# Patient Record
Sex: Male | Born: 1960 | Race: White | Hispanic: No | Marital: Married | Smoking: Former smoker
Health system: Southern US, Community
[De-identification: ages and names within clinical notes are randomized; demographics above are authoritative.]

## PROBLEM LIST (undated history)

## (undated) DIAGNOSIS — E291 Testicular hypofunction: Secondary | ICD-10-CM

## (undated) DIAGNOSIS — E559 Vitamin D deficiency, unspecified: Secondary | ICD-10-CM

## (undated) DIAGNOSIS — M25562 Pain in left knee: Secondary | ICD-10-CM

## (undated) DIAGNOSIS — K5792 Diverticulitis of intestine, part unspecified, without perforation or abscess without bleeding: Secondary | ICD-10-CM

## (undated) DIAGNOSIS — K589 Irritable bowel syndrome without diarrhea: Secondary | ICD-10-CM

## (undated) DIAGNOSIS — E785 Hyperlipidemia, unspecified: Secondary | ICD-10-CM

## (undated) DIAGNOSIS — F419 Anxiety disorder, unspecified: Secondary | ICD-10-CM

## (undated) DIAGNOSIS — G4730 Sleep apnea, unspecified: Secondary | ICD-10-CM

## (undated) DIAGNOSIS — G8929 Other chronic pain: Secondary | ICD-10-CM

## (undated) DIAGNOSIS — T7840XA Allergy, unspecified, initial encounter: Secondary | ICD-10-CM

## (undated) DIAGNOSIS — N200 Calculus of kidney: Secondary | ICD-10-CM

## (undated) DIAGNOSIS — R0989 Other specified symptoms and signs involving the circulatory and respiratory systems: Secondary | ICD-10-CM

## (undated) DIAGNOSIS — M1990 Unspecified osteoarthritis, unspecified site: Secondary | ICD-10-CM

## (undated) DIAGNOSIS — R7303 Prediabetes: Secondary | ICD-10-CM

## (undated) HISTORY — PX: REPLACEMENT TOTAL KNEE: SUR1224

## (undated) HISTORY — DX: Other specified symptoms and signs involving the circulatory and respiratory systems: R09.89

## (undated) HISTORY — PX: MOHS SURGERY: SUR867

## (undated) HISTORY — PX: TONSILLECTOMY: SUR1361

## (undated) HISTORY — DX: Diverticulitis of intestine, part unspecified, without perforation or abscess without bleeding: K57.92

## (undated) HISTORY — DX: Allergy, unspecified, initial encounter: T78.40XA

## (undated) HISTORY — DX: Other chronic pain: G89.29

## (undated) HISTORY — PX: FOOT FUSION: SHX956

## (undated) HISTORY — PX: ELBOW ARTHROSCOPY WITH TENDON RECONSTRUCTION: SHX5616

## (undated) HISTORY — PX: FOREARM SURGERY: SHX651

## (undated) HISTORY — PX: BREAST CYST EXCISION: SHX579

## (undated) HISTORY — DX: Hyperlipidemia, unspecified: E78.5

## (undated) HISTORY — DX: Irritable bowel syndrome, unspecified: K58.9

## (undated) HISTORY — DX: Anxiety disorder, unspecified: F41.9

## (undated) HISTORY — DX: Testicular hypofunction: E29.1

## (undated) HISTORY — PX: POLYPECTOMY: SHX149

## (undated) HISTORY — DX: Vitamin D deficiency, unspecified: E55.9

## (undated) HISTORY — PX: OTHER SURGICAL HISTORY: SHX169

## (undated) HISTORY — PX: UMBILICAL HERNIA REPAIR: SHX196

## (undated) HISTORY — DX: Pain in left knee: M25.562

## (undated) HISTORY — PX: KNEE ARTHROSCOPY: SUR90

## (undated) HISTORY — PX: APPENDECTOMY: SHX54

## (undated) HISTORY — DX: Prediabetes: R73.03

## (undated) HISTORY — PX: KIDNEY STONE SURGERY: SHX686

## (undated) HISTORY — PX: COLONOSCOPY: SHX174

---

## 1997-10-01 ENCOUNTER — Emergency Department (HOSPITAL_COMMUNITY): Admission: EM | Admit: 1997-10-01 | Discharge: 1997-10-01 | Payer: Self-pay | Admitting: Emergency Medicine

## 1997-11-28 ENCOUNTER — Encounter: Payer: Self-pay | Admitting: Internal Medicine

## 1997-11-28 ENCOUNTER — Inpatient Hospital Stay (HOSPITAL_COMMUNITY): Admission: EM | Admit: 1997-11-28 | Discharge: 1997-12-02 | Payer: Self-pay | Admitting: Internal Medicine

## 2001-05-07 ENCOUNTER — Ambulatory Visit (HOSPITAL_COMMUNITY): Admission: RE | Admit: 2001-05-07 | Discharge: 2001-05-07 | Payer: Self-pay | Admitting: Surgery

## 2001-05-07 ENCOUNTER — Encounter: Payer: Self-pay | Admitting: Surgery

## 2003-09-21 ENCOUNTER — Encounter: Admission: RE | Admit: 2003-09-21 | Discharge: 2003-09-21 | Payer: Self-pay | Admitting: Surgery

## 2004-08-07 ENCOUNTER — Ambulatory Visit (HOSPITAL_COMMUNITY): Admission: RE | Admit: 2004-08-07 | Discharge: 2004-08-07 | Payer: Self-pay | Admitting: Internal Medicine

## 2006-12-22 ENCOUNTER — Ambulatory Visit (HOSPITAL_BASED_OUTPATIENT_CLINIC_OR_DEPARTMENT_OTHER): Admission: RE | Admit: 2006-12-22 | Discharge: 2006-12-22 | Payer: Self-pay | Admitting: Orthopedic Surgery

## 2007-03-22 ENCOUNTER — Ambulatory Visit: Payer: Self-pay

## 2007-04-21 ENCOUNTER — Inpatient Hospital Stay (HOSPITAL_COMMUNITY): Admission: RE | Admit: 2007-04-21 | Discharge: 2007-04-24 | Payer: Self-pay | Admitting: Orthopedic Surgery

## 2007-05-13 ENCOUNTER — Encounter: Admission: RE | Admit: 2007-05-13 | Discharge: 2007-08-02 | Payer: Self-pay | Admitting: Orthopedic Surgery

## 2007-12-17 ENCOUNTER — Ambulatory Visit (HOSPITAL_COMMUNITY): Admission: RE | Admit: 2007-12-17 | Discharge: 2007-12-17 | Payer: Self-pay | Admitting: Orthopedic Surgery

## 2008-04-19 ENCOUNTER — Inpatient Hospital Stay (HOSPITAL_COMMUNITY): Admission: RE | Admit: 2008-04-19 | Discharge: 2008-04-22 | Payer: Self-pay | Admitting: Orthopedic Surgery

## 2009-06-01 ENCOUNTER — Encounter
Admission: RE | Admit: 2009-06-01 | Discharge: 2009-08-30 | Payer: Self-pay | Admitting: Physical Medicine & Rehabilitation

## 2009-06-05 ENCOUNTER — Ambulatory Visit: Payer: Self-pay | Admitting: Physical Medicine & Rehabilitation

## 2009-06-11 ENCOUNTER — Ambulatory Visit: Payer: Self-pay | Admitting: Physical Medicine & Rehabilitation

## 2009-06-18 ENCOUNTER — Ambulatory Visit: Payer: Self-pay | Admitting: Physical Medicine & Rehabilitation

## 2009-06-26 ENCOUNTER — Ambulatory Visit: Payer: Self-pay | Admitting: Physical Medicine & Rehabilitation

## 2009-07-03 ENCOUNTER — Ambulatory Visit: Payer: Self-pay | Admitting: Physical Medicine & Rehabilitation

## 2009-08-18 ENCOUNTER — Emergency Department (HOSPITAL_COMMUNITY): Admission: EM | Admit: 2009-08-18 | Discharge: 2009-08-18 | Payer: Self-pay | Admitting: Emergency Medicine

## 2010-05-19 LAB — CBC
Hemoglobin: 13.6 g/dL (ref 13.0–17.0)
MCHC: 33.3 g/dL (ref 30.0–36.0)
MCV: 90.5 fL (ref 78.0–100.0)
Platelets: 179 10*3/uL (ref 150–400)

## 2010-05-19 LAB — POCT I-STAT, CHEM 8
BUN: 10 mg/dL (ref 6–23)
Calcium, Ion: 1.11 mmol/L — ABNORMAL LOW (ref 1.12–1.32)
Chloride: 101 mEq/L (ref 96–112)
Creatinine, Ser: 1.1 mg/dL (ref 0.4–1.5)
Glucose, Bld: 112 mg/dL — ABNORMAL HIGH (ref 70–99)
Potassium: 4.6 mEq/L (ref 3.5–5.1)
TCO2: 27 mmol/L (ref 0–100)

## 2010-05-19 LAB — DIFFERENTIAL
Basophils Relative: 0 % (ref 0–1)
Eosinophils Absolute: 0 10*3/uL (ref 0.0–0.7)
Lymphocytes Relative: 4 % — ABNORMAL LOW (ref 12–46)
Monocytes Absolute: 0.9 10*3/uL (ref 0.1–1.0)
Monocytes Relative: 6 % (ref 3–12)
Neutro Abs: 14.5 10*3/uL — ABNORMAL HIGH (ref 1.7–7.7)
Neutrophils Relative %: 90 % — ABNORMAL HIGH (ref 43–77)

## 2010-06-18 LAB — BASIC METABOLIC PANEL
CO2: 29 mEq/L (ref 19–32)
Creatinine, Ser: 0.74 mg/dL (ref 0.4–1.5)
Creatinine, Ser: 0.79 mg/dL (ref 0.4–1.5)
GFR calc non Af Amer: >60 mL/min (ref >60)
GFR calc non Af Amer: >60 mL/min (ref >60)
Glucose, Bld: 131 mg/dL — ABNORMAL HIGH (ref 70–99)
Glucose, Bld: 158 mg/dL — ABNORMAL HIGH (ref 70–99)
Potassium: 4.1 mEq/L (ref 3.5–5.1)
Sodium: 135 mEq/L (ref 135–145)

## 2010-06-18 LAB — ANAEROBIC CULTURE

## 2010-06-18 LAB — CBC
HCT: 30.6 % — ABNORMAL LOW (ref 39.0–52.0)
HCT: 31.5 % — ABNORMAL LOW (ref 39.0–52.0)
HCT: 31.9 % — ABNORMAL LOW (ref 39.0–52.0)
Hemoglobin: 10.9 g/dL — ABNORMAL LOW (ref 13.0–17.0)
Hemoglobin: 10.9 g/dL — ABNORMAL LOW (ref 13.0–17.0)
MCHC: 33.8 g/dL (ref 30.0–36.0)
MCHC: 34.3 g/dL (ref 30.0–36.0)
MCV: 94.3 fL (ref 78.0–100.0)
MCV: 92.8 fL (ref 78.0–100.0)
Platelets: 197 10*3/uL (ref 150–400)
Platelets: 139 10*3/uL — ABNORMAL LOW (ref 150–400)
Platelets: 148 10*3/uL — ABNORMAL LOW (ref 150–400)
RBC: 4.34 MIL/uL (ref 4.22–5.81)
RBC: 3.3 MIL/uL — ABNORMAL LOW (ref 4.22–5.81)
RBC: 3.43 MIL/uL — ABNORMAL LOW (ref 4.22–5.81)
RDW: 12.3 % (ref 11.5–15.5)
WBC: 7.8 10*3/uL (ref 4.0–10.5)

## 2010-06-18 LAB — URINALYSIS, ROUTINE W REFLEX MICROSCOPIC
Glucose, UA: NEGATIVE mg/dL
Hgb urine dipstick: NEGATIVE
Protein, ur: NEGATIVE mg/dL
Urobilinogen, UA: 0.2 mg/dL (ref 0.0–1.0)

## 2010-06-18 LAB — PROTIME-INR
INR: 0.9 (ref 0.00–1.49)
INR: 1.7 — ABNORMAL HIGH (ref 0.00–1.49)

## 2010-06-18 LAB — BODY FLUID CULTURE

## 2010-06-18 LAB — COMPREHENSIVE METABOLIC PANEL
ALT: 26 U/L (ref 0–53)
AST: 21 U/L (ref 0–37)
Albumin: 4.2 g/dL (ref 3.5–5.2)
CO2: 27 mEq/L (ref 19–32)
Creatinine, Ser: 0.86 mg/dL (ref 0.4–1.5)
GFR calc non Af Amer: >60 mL/min (ref >60)
Glucose, Bld: 102 mg/dL — ABNORMAL HIGH (ref 70–99)

## 2010-06-18 LAB — GRAM STAIN

## 2010-06-18 LAB — TYPE AND SCREEN: ABO/RH(D): O POS

## 2010-07-16 NOTE — Op Note (Signed)
NAMELARNIE, HEART NO.:  [id number]   MEDICAL RECORD NO.:  [id number]          PATIENT TYPE:  INP   LOCATION:  1615                         FACILITY:  [facility]   PHYSICIAN:  Ollen Gross, M.D.    DATE OF BIRTH:  26-Jul-1960   DATE OF PROCEDURE:  04/21/2007  DATE OF DISCHARGE:                               OPERATIVE REPORT   PREOPERATIVE DIAGNOSIS:  Osteoarthritis, left knee.   POSTOPERATIVE DIAGNOSIS:  Osteoarthritis, left knee.   PROCEDURE:  Left total knee arthroplasty.   SURGEON:  Ollen Gross, M.D.   ASSISTANT:  Avel Peace PA-C   ANESTHESIA:  General with postop Marcaine pain pump.   ESTIMATED BLOOD LOSS:  Minimal.   DRAINS:  None.   TOURNIQUET TIME:  42 minutes at 300 mmHg.   COMPLICATIONS:  None.   CONDITION:  Stable to recovery.   BRIEF CLINICAL NOTE:  [name] is a 50 year old male who has significant  medial compartment arthritis of the left knee with progressively  worsening pain and dysfunction.  He has had arthroscopy showing a large  chondral defect on the medial femoral condyle.  Unfortunately, his pain  continues to worsen and his function is worsening also.  He has had  multiple injections.  He had an ACL deficient knee and there is not a  candidate for unicompartmental replacement.  He presents now for total  knee arthroplasty.   PROCEDURE IN DETAIL:  After successful administration of general  anesthetic a tourniquet was placed high on the left thigh and left lower  extremity prepped and draped in usual sterile fashion.  Extremities  wrapped in Esmarch, knee flexed and tourniquet inflated to 300 mmHg.  Midline incision made with 10 blade through subcutaneous tissue to the  level of the extensor mechanism.  A fresh blade is used to make a medial  parapatellar arthrotomy.  Soft tissue over the proximal medial tibia is  subperiosteally elevated to the joint line with the knife and into the  semimembranosus bursa with a Cobb elevator.   Soft tissue laterally is  elevated attention being paid to avoiding the patellar tendon on tibial  tubercle.  Patella was subluxed laterally, knee flexed 90 degrees and  PCL removed.  ACL is ready gone.  He had a lot of scarring in the  infrapatellar area and I removed that also.  A drill was then used to  create a starting hole in the distal femur and the canal was thoroughly  irrigated.  The 5 degrees left valgus alignment guide is placed and  referencing off the posterior condyles, rotations marked and the block  pinned to remove 11 mm off the distal femur.  I took 11 because he had a  slight flexion contracture.  Distal femoral resection is made an  oscillating saw.  Sizing blocks placed, size 3 is most appropriate.  Rotations marked at the epicondylar axis.  Size 3 cutting block was  placed and the anterior-posterior chamfer cuts are made.   The tibia is then subluxed forward and the menisci are removed.  The  extramedullary tibial alignment guide is  placed referencing proximally  at the medial aspect of the tibial tubercle and distally along the  second metatarsal axis and tibial crest.  A block is pinned to remove 10  mm of the non deficient lateral side.  Tibial resection is made with an  oscillating saw.  Size 3 is most appropriate tibial component and then  the proximal tibia prepared with the modular drill and keel punch for  size 3.  Femoral preparation is completed with the intercondylar cut.   Size 3 mobile bearing tibial trial, size 3 posterior stabilized femoral  trial and  a 10-mm posterior stabilized rotating platform insert trial  are placed.  With the 10, full extension is achieved with excellent  varus, valgus, anterior and posterior balance throughout full range of  motion.  Patella was everted, thickness measured to be 22 mm.  Freehand  resection taken 12 mm, 35 template is placed, lug holes were drilled,  trial patella was placed and it tracks normally.   Osteophytes removed  from the posterior femur with the trial in place.  All trials removed  and the cut bone surfaces are prepared with pulsatile lavage.  Cement  was mixed and once ready for implantation, the size 3 mobile bearing  tibial tray, size 3 posterior stabilized femur and 35 patella are  cemented into place.  Patella was held the clamp.  Trial 10-mm inserts  placed, knee held in full extension and all extruded cement removed.  The cement fully hardened then the trials removed and the wound  copiously irrigated with saline solution.  FloSeal was injected on  posterior capsule and then the permanent 10 mm posterior stabilized  rotating platform insert is placed into the tibial tray.  FloSeal was  then placed in the medial and lateral gutters in the suprapatellar area.  Moist sponge is placed and tourniquet released for total time of 42  minutes.  The sponge is removed after two minutes and then minimal  bleeding was encountered.  The bleeding that is encountered is stopped  with electrocautery.  I then irrigated again and closed the arthrotomy  with interrupted #1 PDS.  Flexion against gravity was at 135 degrees.  Subcu closed with interrupted 2-0 Vicryl, subcuticular running 4-0  Monocryl.  The catheter for Marcaine pain pump is placed and the pump  initiated.  Incision is cleaned and dried and Steri-Strips and bulky  sterile dressing applied.  He was then placed into a knee immobilizer,  awakened and transferred to recovery in stable condition.      Ollen Gross, M.D.  Electronically Signed     FA/MEDQ  D:  04/21/2007  T:  04/22/2007  Job:  [id number]

## 2010-07-16 NOTE — H&P (Signed)
NAMEORYAN, [name] NO.:  [id number]   MEDICAL RECORD NO.:  [id number]          PATIENT TYPE:  INP   LOCATION:  NA                           FACILITY:  [facility]   PHYSICIAN:  Ollen Gross, M.D.    DATE OF BIRTH:  [birth year]   DATE OF ADMISSION:  DATE OF DISCHARGE:                              HISTORY & PHYSICAL   Date of office visit history and physical performed on April 15, 2007.  Date of admission,  today's day April 21, 2007.   CHIEF COMPLAINT:  Left knee pain.   HISTORY OF PRESENT ILLNESS:  The patient is a 50 year old male who has  been seen by Dr. Lequita Halt for ongoing knee problems.  He has had a  previous knee scope this past year with meniscal debridement and had a  very large medial femoral condyle defect which had to undergo  chondroplasty.  Over the past several months, his knee has continued to  progressively get worse with extraordinary intense pain at times.  He  has done injections which have been of no benefit.  He has moderate  narrowing, not completely bone-on-bone, but his symptoms are  progressively getting worse to the point where it is impacting his  function and mobility.  It is felt that the most predictive means of  improvement of his pain would be knee replacement.  Risks and benefits  have been discussed and he elected to proceed with surgery.  He has been  seen preoperatively by Dr. Oneta Rack and is cleared for surgery.  He has  also undergone a recent stress test on January 19 of this year which was  normal.   ALLERGIES:  AUGMENTIN CAUSES HIVES.  PENICILLIN CAUSES HIVES.  KEFLEX  CAUSES HIVES AND SWELLING.   INTOLERANCES:  Atenolol causes fatigue.  Zoloft causes diarrhea.  Effexor causes erectile dysfunction.   CURRENT MEDICATIONS:  Lipitor, Klonopin and Vicodin.   PAST MEDICAL HISTORY:  1. Hypertension.  2. Irritable bowel syndrome.  3. Dyslipidemia.  4. Renal calculi.  5. Allergic rhinitis.  6. Diverticulosis.   PAST SURGICAL HISTORY:  1. Appendectomy in 1966.  2. Excision foreign body left arm 1969 and again in 1978.  3. Biopsy left chest wall 1976.  4. Tonsillectomy 1987.  5. Colonoscopy 1997.  6. Cystoscope 1999.  7. Umbilical hernia repair March 2002.  8. Right toe fusion 2004.  9. Right elbow surgery December 2006.  10.Right foot surgery February 2007.   FAMILY HISTORY:  Father deceased age 54.  Mother living age 15, obese.  Siblings:  He has 2 brothers, ages 80 and 26.   SOCIAL HISTORY:  Married Pensions consultant, past smoker, alcohol intake on the  weekends.  Family will be assisting with care after surgery.   REVIEW OF SYSTEMS:  GENERAL:  No fevers, chills or night sweats.  NEUROLOGICAL:  No seizures, syncope, or paralysis.  RESPIRATORY:  No  shortness of breath, productive cough or hemoptysis.  CARDIOVASCULAR:  No chest pain, angina, orthopnea.  GASTROINTESTINAL:  No nausea,  vomiting, diarrhea or constipation.  GENITOURINARY:  No dysuria,  hematuria,  discharge.  MUSCULOSKELETAL:  Left knee.   PHYSICAL EXAMINATION:  VITAL SIGNS:  Pulse 72, respirations 14, blood  pressure 134/82.  GENERAL:  A 50 year old, white male, well-nourished, well-developed, no  acute distress, alert and cooperative, excellent historian.  HEENT:  Normocephalic, atraumatic.  Pupils round and reactive.  Oropharynx clear.  EOMs intact.  NECK:  Supple.  No bruits.  CHEST:  Clear anterior posterior chest walls.  No rhonchi, rales or  wheeze.  HEART:  Regular rate and rhythm.  No murmur.  S1-S2 noted.  ABDOMEN:  Soft, slightly round.  Bowel sounds present.  RECTAL/BREAST/GENITALIA:  Not done, not pertinent to present illness.  EXTREMITIES:  Left knee no effusion, tender medially.  Range of motion 1  to 125.  No instability.   IMPRESSION:  1. Osteoarthritis, left knee.  2. Hypertension.  3. Irritable bowel syndrome.  4. Dyslipidemia.  5. History of renal calculi.  6. Allergic rhinitis.  7. History of  diverticulosis.   PLAN:  The patient admitted to [facility] to undergo a left  total knee replacement arthroplasty.  Surgery will be performed by Ollen Gross.      Alexzandrew L. Perkins, P.A.C.      Ollen Gross, M.D.  Electronically Signed    ALP/MEDQ  D:  04/21/2007  T:  04/21/2007  Job:  [id number]

## 2010-07-16 NOTE — Discharge Summary (Signed)
[name], [name]                 ACCOUNT NO.:  [id number]   MEDICAL RECORD NO.:  [id number]          PATIENT TYPE:  INP   LOCATION:  1615                         FACILITY:  [facility]   PHYSICIAN:  Ollen Gross, M.D.    DATE OF BIRTH:  05/19/1960   DATE OF ADMISSION:  04/21/2007  DATE OF DISCHARGE:  04/24/2007                               DISCHARGE SUMMARY   ADMITTING DIAGNOSES:  1. Osteoarthritis, left knee.  2. Hypertension.  3. Irritable bowel syndrome.  4. Dyslipidemia.  5. History of renal calculi.  6. Allergic rhinitis.  7. History of diverticulosis.   DISCHARGE DIAGNOSES:  1. Osteoarthritis, left knee, status post left total knee replacement      arthroplasty.  2. Postoperative hyponatremia, improving.  3. Hypertension.  4. Irritable bowel syndrome.  5. Dyslipidemia.  6. History of renal calculi.  7. History of diverticulosis.   PROCEDURE:  February 18, 20009 - left total knee.  Surgeon - Dr.  Lequita Halt.  Assistant - Avel Peace PA-C.  Anesthesia general.   CONSULTS:  None.   BRIEF HISTORY:  [name] is a 50 year old male with severe medial  compartment arthritis of the left knee, progressive worsening pain and  dysfunction.  He had a knee scope with a large chondral defective on  medial femoral condyle.  Unfortunately, the pain continues to worsen and  function is worsening.  He has an ACL deficient knee.  He is not a  candidate for unicompartmental replacement.  He now presents for a total  knee.   LABORATORY DATA:  Preoperative CBC showed a hemoglobin of 13.6,  hematocrit of 38.6, white cell count of 4.8, platelets 191.  Serial CBCs  were followed.  Postoperative hemoglobin dropped down to 12.8.  Last  noted hemoglobin and hematocrit of 10.8 and 30.5.  PT/PTT preoperatively  11.9 and 32 respectively.  INR 7.9.  Serial pro times followed.  Last  noted PT/INR 24.3 and 2.1.  Chem panel on admission all within normal  limits.  Serial BMETs were followed.  Sodium did  drop down to 132, back  up to 138.  The remaining electrolytes remained within normal limits.  Preoperative UA negative.  Blood group type O+.   EKG on December 22, 2006 revealed normal sinus rhythm, normal EKG,  unconfirmed.  The patient had a stress test dated March 22, 2007; a  normal stress test nuclear study.   HOSPITAL COURSE:  The patient was admitted to [facility],  taken to the OR and underwent the above-stated procedure without  complication.  The patient total the procedure well and was later  transferred to the recovery room and the orthopedic floor.  Did have  moderate pain on the evening of surgery.  Given p.o. and IV analgesics  for pain control following the surgery.  He had a fairly rough night,  doing a little bit better on the morning of day one though.  Pressure  was a little up, felt to be due to pain component.  Hemoglobin was  stable.  Low output, so we encouraged fluids IV.  He started getting  up  out of bed on day one.  By day two, the patient was still hurting. but  pain was a little bit better.  Weaned over to p.o. medications.  Urine  output improved with the fluids.  He was diuresing his fluids well.  Sodium was down a little bit, felt to be dilutional, at 132.  Rechecked.  Day two, he was up walking about 80 feet.  Dressing was changed.  Incision looked good.  Continued to progress well, and by day three he  was tolerating his medications, walking well and was discharged home.   DISCHARGE/PLAN:  1. The patient discharged home April 24, 2007.  2. Discharge diagnoses - please see above.  3. Discharge medications - Percocet, Robaxin, Coumadin.  4. Diet - resume home diet.  5. Activity - weightbearing as tolerated left lower extremity, total      knee protocol.  Home health PT, home nursing.  Follow up in 2      weeks.   DISPOSITION:  Home.   CONDITION ON DISCHARGE:  Improving.      Alexzandrew L. Perkins, P.A.C.      Ollen Gross, M.D.  Electronically Signed    ALP/MEDQ  D:  06/07/2007  T:  06/07/2007  Job:  [id number]

## 2010-07-16 NOTE — Op Note (Signed)
[name], [name] NO.:  [id number]   MEDICAL RECORD NO.:  [id number]          PATIENT TYPE:  INP   LOCATION:                               FACILITY:  [facility]   PHYSICIAN:  Ollen Gross, M.D.    DATE OF BIRTH:  [birth year]   DATE OF PROCEDURE:  04/19/2008  DATE OF DISCHARGE:                               OPERATIVE REPORT   PREOPERATIVE DIAGNOSES:  Loose left total knee arthroplasty.   POSTOP DIAGNOSIS:  Loose left total knee arthroplasty.   PROCEDURE:  Left total knee arthroplasty revision.   SURGEON:  Ollen Gross, M.D.   ASSISTANT:  Avel Peace PA-C   ANESTHESIA:  General with postop Marcaine pain pump.   ESTIMATED BLOOD LOSS:  Minimal.   DRAIN:  Hemovac times one.   TOURNIQUET TIME:  Up 49 minutes at 300 mmHg, down 8 minutes, up  additional 20 minutes at 300 mmHg.   COMPLICATIONS:  None.   CONDITION:  Stable to recovery.   CLINICAL NOTE:  [name] is a 50 year old male who underwent a left total  knee arthroplasty last year.  He never obtained very good pain relief  and, over time, the pain has gotten progressively worse.  He has done  well with regards to his rehab but due to persistent pain, a bone scan  was obtained a few months ago and it did show increased uptake,  especially around the tibial component.  He presents now for revision  due to intractable pain.   PROCEDURE IN DETAIL:  After successful administration of general  anesthetic, a tourniquet was placed high on his left thigh and his left  lower extremity was prepped and draped in the usual sterile fashion.  Extremity was wrapped in Esmarch, knee flexed, tourniquet inflated to  300 mmHg.  Midline incision was made with a 10-blade through  subcutaneous tissue to the level of the extensor mechanism.  A fresh  blade was used to make a medial parapatellar arthrotomy.  There is  minimal fluid in the joint.  It is sent for stat Gram stain, C&S.  It  did show a few white blood cells, no  organisms.  I then thoroughly  excised the scar from under the extensor mechanism, both medially and  laterally, and freed up the medial and lateral gutters.  I was then able  to sublux the patella laterally.  I removed the tibial polyethylene from  the tibial tray.  I then began to disrupt the interface between the  femoral component and bone.  The femoral component was removed  relatively easily with minimal, if any, bone loss.  The tibia was then  subluxed forward.  I just pried underneath the interface between the  tibia and bone and easily removed the tibia after disrupting the  interface with an oscillating saw.  The tissue throughout the joint had  a very normal appearance.  We thoroughly excised the scar posteriorly.  I then removed the cement from the proximal tibia, as well as the cement  from the femur, to gain access to both the tibial and femoral  canals.  The starter reamer was passed and then the canals of both thoroughly  irrigated with saline.   I went up to 16 mm on the femoral side and 13 mm on tibial side.   The tibia is again subluxed forward and then the extramedullary tibial  cutting guide placed with the 2-degree cutting block.  The block is  pinned to remove about 1 mm or 2 mm from the tibial surface.  The  alignment is proximally at the medial aspect of the tibial tubercle and  distally along the second metatarsal axis and tibial crest.  Resection  is made with an oscillating saw to healthy-appearing bone.  A size 3 is  most appropriate tibial component.  Then we prepared the proximal tibia  with the modular drill.  I then used a 29-mm sleeve broach and got that  down to the appropriate position.  The trial size 3 __________  revision  tray with 29-sleeve and a 13 x 30-stem extension was placed with  excellent fit on the tibia.  We then removed the trial.  The femur was  then addressed.     The 60-mm reamer was placed over the intramedullary cutting guide.   The 5-degree left valgus alignment guide is placed to remove about 2 mm  from the distal femur on each side.  Resection is made with an  oscillating saw.  Size 3 is the most appropriate femoral component.  The  AP cutting block is then placed in the +2 position, effectively raising  the stem and lowering the anterior aspect of the femoral component to  the anterior cortex of the femur.  The AP cutting block is placed for  the size 3 and rotation is based using the epicondylar axis and by  placing a trial spacer with the knee flexed 90 degrees to create a  rectangular flexion gap.  The anterior and posterior cuts were made,  removing minimal bone on either cut.  The intercondylar cuts were then  made with the intercondylar block.  The size 3 posterior stabilized  femur with a 16 x 75 stem extension is then placed.  We had excellent  fit with both of the trials.  I went to a 15-mm insert at which point I  was able to obtain full extension with excellent varus-valgus and  anterior-posterior balance throughout full range of motion.   I did not remove the patellar component but cleared all the soft tissue  from around patella and removed any spurs, performing a patellaplasty.  The patella tracked normally.  I was able to get him flexed to about 135  degrees.  I then let the tourniquet down with initial tourniquet time of  49 minutes.  Moist sponge is placed.  While the tourniquet was down the  components were then assembled on the back table.  After the tourniquet  was down for 8 minutes then we irrigated and reinflated up to 300 mmHg  after wrapping it in Esmarch.  The trials were removed and I sized for  the  cement restricter, which was a size 3.  The size 3 cement  restricter was placed at the appropriate depth in the tibial canal.  The  bone surface was then thoroughly irrigated with pulsatile lavage while  the cement was mixed.  I placed 2 grams of vancomycin into the cement.  Once the  cement is ready, it is injected into the tibial canal and the  tibial component impacted.  We had  great fit with this.  Femoral  component is just cemented distally and press-fit in the canal.  Tibial  component was cemented in the canal also.  The trial 50-mm insert was  placed, knee held in full extension and all extruded cement removed.  Once the cement had fully hardened then the permanent 15-mm posterior  stabilized rotating platform insert was placed into the tibial tray.  The wound was copiously irrigated with saline solution and then FloSeal  injected in the posterior capsule, medial and lateral gutters and  suprapatellar area.  Moist sponge is placed and tourniquet released for  a second time at 20 minutes.  The sponge was held for 2 minutes and  removed.  There was minimal bleeding encountered.  That which is  encountered is stopped with electrocautery.   We then irrigated again and closed the arthrotomy over a Hemovac drain  with interrupted #1 PDS.  Flexion against gravity was 135 degrees.  Subcu was closed with interrupted 2-0 Vicryl and subcuticular running 4-  0 Monocryl.  Catheter for the Marcaine pain pump is placed and the pump  is initiated.  He is then placed into a bulky sterile dressing and knee  immobilizer, awakened and transported to recovery in stable condition.      Ollen Gross, M.D.  Electronically Signed     FA/MEDQ  D:  04/19/2008  T:  04/19/2008  Job:  [id number]

## 2010-07-16 NOTE — H&P (Signed)
[name], [name] NO.:  [id number]   MEDICAL RECORD NO.:  [id number]          PATIENT TYPE:  INP   LOCATION:                               FACILITY:  [facility]   PHYSICIAN:  Ollen Gross, M.D.    DATE OF BIRTH:  [birth year]   DATE OF ADMISSION:  04/19/2008  DATE OF DISCHARGE:                              HISTORY & PHYSICAL   DATE OF OFFICE VISIT HISTORY/PHYSICAL:  April 06, 2008.   DATE OF ADMISSION:  April 19, 2008.   CHIEF COMPLAINT:  Loose left total knee.   HISTORY OF PRESENT ILLNESS:  This is a 50 year old male well-known to  Dr. Ollen Gross.  He has previously undergone a left total knee  arthroplasty but continued to have pain that has been ongoing for quite  some time now.  He underwent a bone scan which showed possible lucency  in the tibial tray posteriorly.  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  Risks and benefits of the procedure have been  discussed with the patient.  He elects to proceed with surgery.   ALLERGIES:  PENICILLIN AND ALL PENICILLIN DERIVATIVES CAUSE HIVES.  AUGMENTIN CAUSES HIVES.  KEFLEX CAUSES HIVES AND SWELLING.   CURRENT MEDICATIONS:  Lipitor, Cipro, doxycycline, Percocet, vitamin D.   PAST MEDICAL HISTORY:  1. Hypertension.  2. Irritable bowel syndrome.  3. Dyslipidemia.  4. Renal calculi.  5. Allergic rhinitis.  6. Diverticulosis.   PAST SURGICAL HISTORY:  Appendectomy in 1966, excision foreign body left  arm 1969 and again 1978, biopsy left chest wall in 1976, tonsillectomy  1987, colonoscopy 1997, cystoscope 1999, umbilical hernia repair March  2002, right toe fusion 2004, right elbow surgery December 2006, left  total knee February 2009.   FAMILY HISTORY:  Father deceased at age 17 with COPD.  Mother living age  76.  Has two brothers.   SOCIAL HISTORY:  Married, Pensions consultant.  Past smoker, occasional intake of  alcohol on the weekends.  One child.  He does have a caregiver lined up  after surgery.   REVIEW OF SYSTEMS:  GENERAL:  No fevers, chills or night sweats.  NEURO:  No seizures, syncope or paralysis.  RESPIRATORY:  No shortness breath,  productive cough or hemoptysis.  CARDIOVASCULAR:  No chest pain,angina,  orthopnea.  GI:  No nausea, vomiting, diarrhea or constipation.  GU:  No  dysuria, hematuria or discharge.  MUSCULOSKELETAL:  Left knee.   PHYSICAL EXAM:  VITAL SIGNS:  Pulse 72, respirations 14, blood pressure  132/82.  GENERAL:  50 year old white male well-nourished, well-developed, in no  acute distress.  Alert, oriented, cooperative.  Excellent historian.  HEENT:  Normocephalic, atraumatic.  Pupils are round and reactive.  Oropharynx clear.  EOMs intact.  NECK:  Supple.  No bruits.  CHEST:  Clear anterior posterior chest walls.  No rhonchi, rales or  wheezing.  HEART:  Regular rate and rhythm.  No murmur.  S1, ST noted.  ABDOMEN:  Soft, slightly round.  Bowel sounds present, nontender.  RECTAL/BREASTS/GENITALIA:  Not done.  Not pertinent to present  illness.  EXTREMITIES:  Left knee anterior incision is well-healed.  Range of  motion 5-125.  No obvious instability.   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   PLAN:  The patient will be admitted to [facility] to undergo a  revision arthroplasty of the left total knee.  Surgery will be performed  by Dr. Ollen Gross.      Alexzandrew L. Perkins, P.A.C.      Ollen Gross, M.D.  Electronically Signed    ALP/MEDQ  D:  04/18/2008  T:  04/18/2008  Job:  [id number]   cc:   Ollen Gross, M.D.  Fax: [phone]   Jeoffrey Massed, MD  Fax: [phone]

## 2010-07-16 NOTE — Op Note (Signed)
NAMEAKIF, WELDY                 ACCOUNT NO.:  [id number]   MEDICAL RECORD NO.:  [id number]          PATIENT TYPE:  AMB   LOCATION:  [facility]                         FACILITY:  [facility]   PHYSICIAN:  Ollen Gross, M.D.    DATE OF BIRTH:  April 15, 1960   DATE OF PROCEDURE:  12/22/2006  DATE OF DISCHARGE:                               OPERATIVE REPORT   PREOPERATIVE DIAGNOSIS:  Left knee chondral defect.   POSTOPERATIVE DIAGNOSIS:  Left knee chondral defect, plus medial  meniscal tear.   PROCEDURE:  Left knee arthroscopy with meniscal debridement and  chondroplasty.   SURGEON:  Ollen Gross, M.D., no assistant.   ANESTHESIA:  General.   ESTIMATED BLOOD LOSS:  Minimal.   DRAINS:  None.   COMPLICATIONS:  None.   CONDITION:  Stable to recovery.   BRIEF CLINICAL NOTE:  [name] is a 50 year old male with a several year  history of worsening knee pain.  Previous arthroscopy elsewhere and I  have been seeing him now for about six months.  He has had extensive  nonoperative management of this knee with progressively worsening pain  and dysfunction.  The regular plain radiographs did not show a  significant arthritic change.  Given the pain and progressive  dysfunction it was decided to do a diagnostic arthroscopy with treatment  of any associated pathology.   PROCEDURE IN DETAIL:  After successful initiation of general anesthetic,  a tourniquet placed high on the left thigh and left lower extremity  prepped and draped in the usual sterile fashion.  The standard  superomedial and inferolateral incisions made with the inflow cannula  passed superomedial and camera passed inferolateral.  Arthroscopic  visualization proceeds.  Undersurface patella shows no evidence of any  chondral defect.  A lot of hypertrophic scar at the inferior pole of  patella and nothing on the articular surface.  He had a very shallow  trochlea with some grade 2 chondromalacia but no full-thickness defects.  Mediolateral gutters were visualized and there is no loose body.  Flexion and valgus force applied to the knee and the medial compartment  was entered.  He does have a small tear in posterior horn of the medial  meniscus and has about a 2 x 2 cm chondral defect of the medial most  aspect of the medial femoral condyle.  A spinal needle was used to  localize the inferomedial portal, small incision made, dilator placed  and then I probed the meniscus.  Although it looked like a small tear  visually with the probe, it is found to be a larger tear posteriorly.  I  used a basket and a 4.2 mm shaver to debride this back to a stable base  and it was found to be stable.  There was some cartilage peeling off  that defect on the medial femoral condyle.  There was a small amount of  exposed bone off also with appeared almost a layer of scarring over  that.  I debrided this back to a stable bony base and then abraded the  bone for hopeful attempt to form fibrocartilage  over the defect.  The  edges of the cartilage were normal.  The remainder of the medial  compartment looks fine.  The intercondylar notch was visualized and ACL  was absent.  The lateral compartment was entered and it looks normal.  I  again inspected the joint and there is no further tears or loose bodies.  The area of scarring in the inferior aspect of patella is visualized and  I debrided some that the scar tissue back to more healthy-appearing  tissue.  The arthroscopic equipment is then removed from the inferior  portals which were closed with interrupted 4-0 nylon.  20 mL of 4%  Marcaine with epi injected through the inflow cannula and that is  removed and that portal closed with nylon.  A bulky sterile dressing  applied and he is awakened and transferred to recovery in stable  condition.      Ollen Gross, M.D.  Electronically Signed     FA/MEDQ  D:  12/22/2006  T:  12/23/2006  Job:  [id number]

## 2010-07-19 NOTE — Discharge Summary (Signed)
[name], [name]                 ACCOUNT NO.:  [id number]   MEDICAL RECORD NO.:  [id number]          PATIENT TYPE:  INP   LOCATION:  1601                         FACILITY:  [facility]   PHYSICIAN:  Ollen Gross, M.D.    DATE OF BIRTH:  05-Sep-1960   DATE OF ADMISSION:  04/19/2008  DATE OF DISCHARGE:  04/22/2008                               DISCHARGE SUMMARY   DISCHARGE DIAGNOSIS:  Loose left total knee arthroplasty.   OTHER DIAGNOSES:  1. Hypertension.  2. Irritable bowel syndrome.  3. Dyslipidemia.  4. Renal calculi.  5. Allergic rhinitis.  6. Diverticulosis.   PROCEDURES:  Revision left total knee arthroplasty performed April 19, 2008.   HOSPITAL COURSE:  [name] is a 50 year old male who underwent a left total  knee arthroplasty approximately a year ago.  He had persistent pain in  the knee throughout his postoperative course.  This got progressively  worse over time.  A bone scan was performed in the fall and noted that  there was increased uptake around the components consistent with  possible loosening.  He did not have any signs of infection at all.  Due  to his persistent worsening pain, it was decided to pursue revision  total knee arthroplasty.  He presented for that procedure and on  April 19, 2008 was taken to the operating room at which time he  underwent a left total knee arthroplasty revision performed by Dr.  Lequita Halt and assisted by Avel Peace, PA-C.  He tolerated the procedure  well without complications.  He went to the recovery room postop and  then to the orthopedic floor.  He had stable vital signs throughout the  first postoperative night with intact neurovascular function in the left  lower extremity.  On postoperative day #1, his pain was very well-  controlled with the oral analgesics and the PCA pump.  Again, he had  intact neurovascular function.   He had a hemoglobin of 10.9 with normal electrolytes with a sodium of  135, potassium 4.1,  chloride 102, CO2 of 29, BUN 6, creatinine 0.79 and  glucose 158.  All of the intraoperative cultures were negative.  He got  up with therapy that day and ambulated.  On postoperative day #2, the  Foley catheter PCA pump and IVs were discontinued.  He still had good  pain control.  Hemoglobin was 10.4 with normal electrolytes.  He did  very well with his physical therapy and on postoperative day #3  ambulated 135 degrees.  He was able to ambulate without his knee  immobilizer and do straight leg raising.  Hemoglobin was 10.9 as of  postoperative day #3 with INR of 2.2.  He had met his physical therapy  goals and it was felt that he was ready for discharge on postoperative  day #3.   DISPOSITION:  He is discharged home with home health physical therapy  and home health nursing for monitoring of his Coumadin and INR levels.  He will be on Coumadin for total of 3 weeks.  He is tolerating a regular  diet and is  cleared to shower at the time of discharge.   DISCHARGE MEDICATIONS:  1. Oxycodone IR 10 mg 1-2 tablets every 4-6 hours as needed for pain.  2. Robaxin 500 mg 1 tablet every 6-8 hours as needed for muscle spasm.  3. Coumadin as per pharmacy protocol, a total of 3 weeks postop.   HOME MEDICATIONS:  1. Lipitor 40 mg p.o. q. a.m.  2. Clonazepam 1 mg q.h.s.  3. Ciprofloxacin 500 mm b.i.d. as needed for diverticulitis.      Ollen Gross, M.D.  Electronically Signed     FA/MEDQ  D:  05/27/2008  T:  05/27/2008  Job:  [id number]

## 2010-11-22 LAB — TYPE AND SCREEN
ABO/RH(D): O POS
Antibody Screen: NEGATIVE

## 2010-11-22 LAB — BASIC METABOLIC PANEL
BUN: 11
BUN: 5 — ABNORMAL LOW
CO2: 25
CO2: 28
Calcium: 8.2 — ABNORMAL LOW
Calcium: 8.3 — ABNORMAL LOW
Chloride: 103
Creatinine, Ser: 0.7
Creatinine, Ser: 0.75
Creatinine, Ser: 1.12
GFR calc Af Amer: >60
GFR calc Af Amer: >60
GFR calc non Af Amer: >60
GFR calc non Af Amer: >60
Glucose, Bld: 112 — ABNORMAL HIGH
Glucose, Bld: 139 — ABNORMAL HIGH
Glucose, Bld: 142 — ABNORMAL HIGH
Sodium: 138

## 2010-11-22 LAB — PROTIME-INR
INR: 1
INR: 2.1 — ABNORMAL HIGH
Prothrombin Time: 13.6
Prothrombin Time: 17.2 — ABNORMAL HIGH
Prothrombin Time: 24.3 — ABNORMAL HIGH

## 2010-11-22 LAB — COMPREHENSIVE METABOLIC PANEL
ALT: 41
Albumin: 3.8
Alkaline Phosphatase: 48
BUN: 11
Calcium: 9.1
Chloride: 103
Creatinine, Ser: 0.84
GFR calc Af Amer: >60
GFR calc non Af Amer: >60
Sodium: 138
Total Bilirubin: 0.7

## 2010-11-22 LAB — URINALYSIS, ROUTINE W REFLEX MICROSCOPIC
Hgb urine dipstick: NEGATIVE
Specific Gravity, Urine: 1.017
Urobilinogen, UA: 0.2

## 2010-11-22 LAB — CBC
HCT: 31.8 — ABNORMAL LOW
Hemoglobin: 10.8 — ABNORMAL LOW
MCHC: 34.7
MCHC: 35.3
MCV: 90.4
Platelets: 169
Platelets: 216
RBC: 4.34
RBC: 3.37 — ABNORMAL LOW
RDW: 12.5
RDW: 12.7
RDW: 12.8
WBC: 4.8
WBC: 10.5

## 2010-12-11 LAB — POCT HEMOGLOBIN-HEMACUE: Operator id: 268271

## 2011-12-30 ENCOUNTER — Encounter: Payer: Self-pay | Admitting: Gastroenterology

## 2012-01-07 ENCOUNTER — Ambulatory Visit (AMBULATORY_SURGERY_CENTER): Payer: BC Managed Care – PPO | Admitting: *Deleted

## 2012-01-07 ENCOUNTER — Encounter: Payer: Self-pay | Admitting: Gastroenterology

## 2012-01-07 VITALS — Ht 65.5 in | Wt 206.0 lb

## 2012-01-07 DIAGNOSIS — Z1211 Encounter for screening for malignant neoplasm of colon: Secondary | ICD-10-CM

## 2012-01-07 MED ORDER — MOVIPREP 100 G PO SOLR: ORAL | Status: DC

## 2012-01-20 ENCOUNTER — Encounter: Payer: Self-pay | Admitting: Gastroenterology

## 2012-01-22 ENCOUNTER — Encounter: Payer: Self-pay | Admitting: Gastroenterology

## 2012-01-22 ENCOUNTER — Ambulatory Visit (AMBULATORY_SURGERY_CENTER): Payer: BC Managed Care – PPO | Admitting: Gastroenterology

## 2012-01-22 VITALS — BP 142/88 | HR 64 | Temp 98.1°F | Resp 18 | Ht 65.0 in | Wt 206.0 lb

## 2012-01-22 DIAGNOSIS — D126 Benign neoplasm of colon, unspecified: Secondary | ICD-10-CM

## 2012-01-22 DIAGNOSIS — K5730 Diverticulosis of large intestine without perforation or abscess without bleeding: Secondary | ICD-10-CM

## 2012-01-22 DIAGNOSIS — Z1211 Encounter for screening for malignant neoplasm of colon: Secondary | ICD-10-CM

## 2012-01-22 LAB — HM COLONOSCOPY

## 2012-01-22 MED ORDER — SODIUM CHLORIDE 0.9 % IV SOLN: 500.0000 mL | INTRAVENOUS | Status: DC

## 2012-01-22 NOTE — Progress Notes (Signed)
Patient did not experience any of the following events: a burn prior to discharge; a fall within the facility; wrong site/side/patient/procedure/implant event; or a hospital transfer or hospital admission upon discharge from the facility. (G8907) Patient did not have preoperative order for IV antibiotic SSI prophylaxis. (G8918)  

## 2012-01-22 NOTE — Patient Instructions (Addendum)

## 2012-01-22 NOTE — Op Note (Signed)
[facility] [address] [place], [address]   COLONOSCOPY PROCEDURE REPORT  PATIENT: [name], [name]  MR#: [id number] BIRTHDATE: 07-Oct-1960 , 51  yrs. old GENDER: Male ENDOSCOPIST: Louis Meckel, MD REFERRED AO:[id number] Oneta Rack, M.D. PROCEDURE DATE:  01/22/2012 PROCEDURE:   Colonoscopy with cold biopsy polypectomy ASA CLASS:   Class II INDICATIONS:average risk screening. MEDICATIONS: MAC sedation, administered by CRNA and propofol (Diprivan) 300mg  IV  DESCRIPTION OF PROCEDURE:   After the risks benefits and alternatives of the procedure were thoroughly explained, informed consent was obtained.  A digital rectal exam revealed no abnormalities of the rectum.   The LB CF-[id number]  endoscope was introduced through the anus and advanced to the cecum, which was identified by both the appendix and ileocecal valve. No adverse events experienced.   The quality of the prep was Suprep good  The instrument was then slowly withdrawn as the colon was fully examined.      COLON FINDINGS: There were 2 sessile one and 2 mm polyps in the cecum.  Each was removed with cold biopsy forceps.  There was a single 2 mm polyp in the ascending colon removed with cold biopsy forceps.   Moderate diverticulosis was noted in the sigmoid colon. Mild diverticulosis was noted in the descending colon.   The colon mucosa was otherwise normal.  Retroflexed views revealed no abnormalities. The time to cecum=minutes 0 seconds.  Withdrawal time=10 minutes 20 seconds.  The scope was withdrawn and the procedure completed. COMPLICATIONS: There were no complications.  ENDOSCOPIC IMPRESSION: 1.   There were 2 sessile one and 2 mm polyps in the cecum.  Each was removed with cold biopsy forceps.  There was a single 2 mm polyp in the ascending colon removed with cold biopsy forceps. 2.   Moderate diverticulosis was noted in the sigmoid colon 3.   Mild diverticulosis was noted in the  descending colon 4.   The colon mucosa was otherwise normal  RECOMMENDATIONS: If the polyp(s) removed today are proven to be adenomatous (pre-cancerous) polyps, you will need a repeat colonoscopy in 5 years.  Otherwise you should continue to follow colorectal cancer screening guidelines for "routine risk" patients with colonoscopy in 10 years.  You will receive a letter within 1-2 weeks with the results of your biopsy as well as final recommendations.  Please call my office if you have not received a letter after 3 weeks.   eSigned:  Louis Meckel, MD 01/22/2012 3:26 PM   cc:   PATIENT NAME:  [name], [name] MR#: [id number]

## 2012-01-23 ENCOUNTER — Telehealth: Payer: Self-pay | Admitting: *Deleted

## 2012-01-23 NOTE — Telephone Encounter (Signed)
Left message on number given in [email or web address] 

## 2012-02-02 ENCOUNTER — Encounter: Payer: Self-pay | Admitting: Gastroenterology

## 2012-10-27 ENCOUNTER — Encounter: Payer: Self-pay | Admitting: Pulmonary Disease

## 2012-10-27 ENCOUNTER — Ambulatory Visit (INDEPENDENT_AMBULATORY_CARE_PROVIDER_SITE_OTHER): Payer: BC Managed Care – PPO | Admitting: Pulmonary Disease

## 2012-10-27 VITALS — BP 132/80 | HR 100 | Temp 97.6°F | Ht 66.0 in | Wt 215.0 lb

## 2012-10-27 DIAGNOSIS — G4733 Obstructive sleep apnea (adult) (pediatric): Secondary | ICD-10-CM

## 2012-10-27 DIAGNOSIS — Z9989 Dependence on other enabling machines and devices: Secondary | ICD-10-CM | POA: Insufficient documentation

## 2012-10-27 NOTE — Assessment & Plan Note (Signed)
The patient's history is very suggestive of clinically significant sleep apnea.  I had a long discussion with him about the pathophysiology of sleep apnea, including its impact to his cardiovascular health and quality of life.  I think he needs to have a sleep study done, and the patient is agreeable.  Given his history and overall good health, I think he is a great candidate for sleep testing.  We'll arrange followup when the results are available.

## 2012-10-27 NOTE — Patient Instructions (Addendum)
Will schedule for home sleep testing, and will arrange for followup once the results are available. Work on weight loss. 

## 2012-10-27 NOTE — Progress Notes (Signed)
Subjective:    Patient ID: [name], male    DOB: 1960/12/11, 52 y.o.   MRN: [id number]  HPI The patient is a 52 year old male who been asked to see for possible obstructive sleep apnea.  He has been noted to have loud snoring, as well as an abnormal breathing pattern during sleep by his wife.  He is not rested in the mornings upon arising, and does note daytime sleepiness with inactivity.  However, he stays very busy with his job during the day.  He will follow sleep easily watching television or movies in the evening, but denies any sleepiness while he is actually driving.  He will fall asleep in the car quickly if someone else is driving.  The patient states that his weight is neutral in the last 2 years, and his Epworth score today is abnormal at 18-19.   Sleep Questionnaire What time do you typically go to bed?( Between what hours) 11p-12a 11p-12a at 1533 on 10/27/12 by Maisie Fus, CMA How long does it take you to fall asleep? less than 15 mins most nights--3-4hrs at times less than 15 mins most nights--3-4hrs at times at 1533 on 10/27/12 by Maisie Fus, CMA How many times during the night do you wake up? 22 wife states that he awakens more frequently than 2x at 1533 on 10/27/12 by Maisie Fus, CMA What time do you get out of bed to start your day? No Value 4-5am at 1533 on 10/27/12 by Maisie Fus, CMA Do you drive or operate heavy machinery in your occupation? No No at 1533 on 10/27/12 by Maisie Fus, CMA How much has your weight changed (up or down) over the past two years? (In pounds) 5 lb (2.268 kg)5 lb (2.268 kg) increase/decrease at 1533 on 10/27/12 by Maisie Fus, CMA Have you ever had a sleep study before? No No at 1533 on 10/27/12 by Maisie Fus, CMA Do you currently use CPAP? No No at 1533 on 10/27/12 by Maisie Fus, CMA Do you wear oxygen at any time? No No at 1533 on 10/27/12 by Maisie Fus, CMA   Review of Systems   Constitutional: Negative for fever and unexpected weight change.  HENT: Negative for ear pain, nosebleeds, congestion, sore throat, rhinorrhea, sneezing, trouble swallowing, dental problem, postnasal drip and sinus pressure.        Allergies/sinus  Eyes: Negative for redness and itching.  Respiratory: Negative for cough, chest tightness, shortness of breath and wheezing.   Cardiovascular: Positive for leg swelling ( left knee replaced x 2). Negative for palpitations.  Gastrointestinal: Negative for nausea and vomiting.  Genitourinary: Negative for dysuria.  Musculoskeletal: Negative for joint swelling.  Skin: Negative for rash.  Neurological: Negative for headaches.  Hematological: Does not bruise/bleed easily.  Psychiatric/Behavioral: Negative for dysphoric mood. The patient is not nervous/anxious.        Objective:   Physical Exam Constitutional: overweight male, no acute distress  HENT:  Nares patent without discharge, septal deviation to left with near obstruction  Oropharynx without exudate, palate and uvula are moderately elongated.   Eyes:  Perrla, eomi, no scleral icterus  Neck:  No JVD, no TMG  Cardiovascular:  Normal rate, regular rhythm, no rubs or gallops.  No murmurs        Intact distal pulses  Pulmonary :  Normal breath sounds, no stridor or respiratory distress   No rales, rhonchi, or wheezing  Abdominal:  Soft, nondistended, bowel sounds  present.  No tenderness noted.   Musculoskeletal:  trace lower extremity edema noted.  Lymph Nodes:  No cervical lymphadenopathy noted  Skin:  No cyanosis noted  Neurologic:  Appears sleepy but appropriate, moves all 4 extremities without obvious deficit.         Assessment & Plan:

## 2012-11-12 ENCOUNTER — Telehealth: Payer: Self-pay | Admitting: Pulmonary Disease

## 2012-11-12 NOTE — Telephone Encounter (Signed)
Pt needs ov to review his recent sleep study 

## 2012-11-15 ENCOUNTER — Encounter: Payer: Self-pay | Admitting: Pulmonary Disease

## 2012-11-18 NOTE — Telephone Encounter (Signed)
LMOM x 1 

## 2012-11-19 ENCOUNTER — Telehealth: Payer: Self-pay | Admitting: Pulmonary Disease

## 2012-11-19 NOTE — Telephone Encounter (Signed)
Called and spoke with pt and he is aware of appt with [facility] on Tuesday 9-23 at 9am.  Nothing further is needed.

## 2012-11-19 NOTE — Telephone Encounter (Signed)
Pt is aware of appt with [facility] on 9-23 at 9.

## 2012-11-23 ENCOUNTER — Ambulatory Visit (INDEPENDENT_AMBULATORY_CARE_PROVIDER_SITE_OTHER): Payer: BC Managed Care – PPO | Admitting: Pulmonary Disease

## 2012-11-23 ENCOUNTER — Encounter: Payer: Self-pay | Admitting: Pulmonary Disease

## 2012-11-23 VITALS — BP 142/78 | HR 53 | Temp 97.4°F | Ht 66.0 in | Wt 216.0 lb

## 2012-11-23 DIAGNOSIS — G4733 Obstructive sleep apnea (adult) (pediatric): Secondary | ICD-10-CM

## 2012-11-23 NOTE — Patient Instructions (Addendum)
Will start on cpap at a moderate pressure.  Please call if having tolerance issues. Work on weight reduction. followup with me in 6 weeks.

## 2012-11-23 NOTE — Progress Notes (Signed)
  Subjective:    Patient ID: [name], male    DOB: 23-Jan-1961, 52 y.o.   MRN: [id number]  HPI Patient comes in today for followup of his recent sleep study.  He was found to have moderate to severe OSA, with an AHI 37 events per hour and oxygen desaturation as low as 72%.  I reviewed the study with him in detail, and answered all of his questions.   Review of Systems  Constitutional: Negative for fever and unexpected weight change.  HENT: Negative for ear pain, nosebleeds, congestion, sore throat, rhinorrhea, sneezing, trouble swallowing, dental problem, postnasal drip and sinus pressure.        Allergies  Eyes: Negative for redness and itching.  Respiratory: Negative for cough, chest tightness, shortness of breath and wheezing.   Cardiovascular: Negative for palpitations and leg swelling.  Gastrointestinal: Negative for nausea and vomiting.  Genitourinary: Negative for dysuria.  Musculoskeletal: Negative for joint swelling.  Skin: Negative for rash.  Neurological: Negative for headaches.  Hematological: Does not bruise/bleed easily.  Psychiatric/Behavioral: Negative for dysphoric mood. The patient is not nervous/anxious.        Objective:   Physical Exam Overweight male in no acute distress Nose without purulent or discharge noted Neck without lymphadenopathy or thyromegaly Lower extremities without edema, no cyanosis Alert and oriented, moves all 4 extremities.       Assessment & Plan:

## 2012-11-23 NOTE — Assessment & Plan Note (Signed)
The patient has moderate to severe OSA by his recent sleep study, and I have recommended a trial of CPAP while working on weight loss.  The patient is agreeable to trying this. I will set the patient up on cpap at a moderate pressure level to allow for desensitization, and will troubleshoot the device over the next 4-6weeks if needed.  The pt is to call me if having issues with tolerance.  Will then optimize the pressure once patient is able to wear cpap on a consistent basis.

## 2013-01-04 ENCOUNTER — Ambulatory Visit (INDEPENDENT_AMBULATORY_CARE_PROVIDER_SITE_OTHER): Payer: BC Managed Care – PPO | Admitting: Pulmonary Disease

## 2013-01-04 ENCOUNTER — Encounter: Payer: Self-pay | Admitting: Pulmonary Disease

## 2013-01-04 VITALS — BP 140/78 | HR 44 | Temp 98.1°F | Ht 66.0 in | Wt 217.2 lb

## 2013-01-04 DIAGNOSIS — G4733 Obstructive sleep apnea (adult) (pediatric): Secondary | ICD-10-CM

## 2013-01-04 NOTE — Patient Instructions (Signed)
Will optimize pressure on the auto setting.  Will call once I receive the dowload. Work on weight loss followup with me in 6mos, but call if having tolerance issues.

## 2013-01-04 NOTE — Progress Notes (Signed)
  Subjective:    Patient ID: [name], male    DOB: June 17, 1960, 52 y.o.   MRN: [id number]  HPI The patient comes in today for followup of his obstructive sleep apnea.  He was started on CPAP at the last visit, he has been wearing the device compliantly.  His snoring has resolved according to his wife, and he has seen some improvement in his sleep and daytime alertness.  He initially had mask issues, but now it is fitting fine.   Review of Systems  Constitutional: Negative for fever and unexpected weight change.  HENT: Negative for congestion, dental problem, ear pain, nosebleeds, postnasal drip, rhinorrhea, sinus pressure, sneezing, sore throat and trouble swallowing.   Eyes: Negative for redness and itching.  Respiratory: Negative for cough, chest tightness, shortness of breath and wheezing.   Cardiovascular: Negative for palpitations and leg swelling.  Gastrointestinal: Negative for nausea and vomiting.  Genitourinary: Negative for dysuria.  Musculoskeletal: Negative for joint swelling.  Skin: Negative for rash.  Neurological: Negative for headaches.  Hematological: Does not bruise/bleed easily.  Psychiatric/Behavioral: Negative for dysphoric mood. The patient is not nervous/anxious.        Objective:   Physical Exam Overweight male in nad Nose without purulence or d/c noted. No skin breakdown or pressure necrosis from cpap mask.  Neck without LN or TMG LE without edema, no cyanosis Alert and oriented, moves all 4.        Assessment & Plan:

## 2013-01-04 NOTE — Assessment & Plan Note (Signed)
Patient been wearing CPAP compliantly, and has had a positive response.  I reminded him we need to optimize his pressure, and we'll do this on the automatic setting.  Finally, I encouraged him to work aggressively on weight loss.

## 2013-03-08 ENCOUNTER — Ambulatory Visit: Payer: Self-pay | Admitting: Internal Medicine

## 2013-03-16 DIAGNOSIS — R0989 Other specified symptoms and signs involving the circulatory and respiratory systems: Secondary | ICD-10-CM | POA: Insufficient documentation

## 2013-03-16 DIAGNOSIS — K5792 Diverticulitis of intestine, part unspecified, without perforation or abscess without bleeding: Secondary | ICD-10-CM | POA: Insufficient documentation

## 2013-03-16 DIAGNOSIS — E559 Vitamin D deficiency, unspecified: Secondary | ICD-10-CM | POA: Insufficient documentation

## 2013-03-16 DIAGNOSIS — E782 Mixed hyperlipidemia: Secondary | ICD-10-CM | POA: Insufficient documentation

## 2013-03-16 DIAGNOSIS — K589 Irritable bowel syndrome without diarrhea: Secondary | ICD-10-CM | POA: Insufficient documentation

## 2013-03-16 DIAGNOSIS — E349 Endocrine disorder, unspecified: Secondary | ICD-10-CM | POA: Insufficient documentation

## 2013-03-16 DIAGNOSIS — R7309 Other abnormal glucose: Secondary | ICD-10-CM | POA: Insufficient documentation

## 2013-03-17 ENCOUNTER — Encounter: Payer: Self-pay | Admitting: Internal Medicine

## 2013-03-17 ENCOUNTER — Ambulatory Visit (INDEPENDENT_AMBULATORY_CARE_PROVIDER_SITE_OTHER): Payer: BC Managed Care – PPO | Admitting: Internal Medicine

## 2013-03-17 VITALS — BP 128/76 | HR 56 | Temp 98.1°F | Resp 16 | Wt 212.8 lb

## 2013-03-17 DIAGNOSIS — Z79899 Other long term (current) drug therapy: Secondary | ICD-10-CM

## 2013-03-17 DIAGNOSIS — I10 Essential (primary) hypertension: Secondary | ICD-10-CM

## 2013-03-17 DIAGNOSIS — R7309 Other abnormal glucose: Secondary | ICD-10-CM

## 2013-03-17 DIAGNOSIS — E782 Mixed hyperlipidemia: Secondary | ICD-10-CM

## 2013-03-17 DIAGNOSIS — E291 Testicular hypofunction: Secondary | ICD-10-CM

## 2013-03-17 DIAGNOSIS — E559 Vitamin D deficiency, unspecified: Secondary | ICD-10-CM

## 2013-03-17 LAB — BASIC METABOLIC PANEL WITH GFR
BUN: 13 mg/dL (ref 6–23)
CALCIUM: 9.8 mg/dL (ref 8.4–10.5)
CO2: 30 mEq/L (ref 19–32)
Chloride: 100 mEq/L (ref 96–112)
Creat: 0.9 mg/dL (ref 0.50–1.35)
GFR, Est African American: >89 mL/min
GFR, Est Non African American: >89 mL/min
GLUCOSE: 98 mg/dL (ref 70–99)
Potassium: 4.8 mEq/L (ref 3.5–5.3)
Sodium: 141 mEq/L (ref 135–145)

## 2013-03-17 LAB — CBC WITH DIFFERENTIAL/PLATELET
BASOS PCT: 1 % (ref 0–1)
Basophils Absolute: 0.1 10*3/uL (ref 0.0–0.1)
EOS ABS: 0.2 10*3/uL (ref 0.0–0.7)
Eosinophils Relative: 2 % (ref 0–5)
HEMATOCRIT: 46.9 % (ref 39.0–52.0)
HEMOGLOBIN: 16.1 g/dL (ref 13.0–17.0)
LYMPHS ABS: 1.5 10*3/uL (ref 0.7–4.0)
Lymphocytes Relative: 22 % (ref 12–46)
MCH: 31.2 pg (ref 26.0–34.0)
MCHC: 34.3 g/dL (ref 30.0–36.0)
MCV: 90.9 fL (ref 78.0–100.0)
MONO ABS: 1 10*3/uL (ref 0.1–1.0)
Monocytes Relative: 15 % — ABNORMAL HIGH (ref 3–12)
Neutro Abs: 4.1 10*3/uL (ref 1.7–7.7)
Neutrophils Relative %: 60 % (ref 43–77)
Platelets: 211 10*3/uL (ref 150–400)
RBC: 5.16 MIL/uL (ref 4.22–5.81)
RDW: 13.3 % (ref 11.5–15.5)
WBC: 6.7 10*3/uL (ref 4.0–10.5)

## 2013-03-17 LAB — TSH: TSH: 1.032 u[IU]/mL (ref 0.350–4.500)

## 2013-03-17 LAB — LIPID PANEL
CHOL/HDL RATIO: 2.8 ratio
Cholesterol: 156 mg/dL (ref 0–200)
HDL: 55 mg/dL (ref >39)
LDL Cholesterol: 89 mg/dL (ref 0–99)
Triglycerides: 61 mg/dL (ref <150)
VLDL: 12 mg/dL (ref 0–40)

## 2013-03-17 LAB — HEPATIC FUNCTION PANEL
ALT: 28 U/L (ref 0–53)
AST: 36 U/L (ref 0–37)
Albumin: 4.5 g/dL (ref 3.5–5.2)
Alkaline Phosphatase: 46 U/L (ref 39–117)
BILIRUBIN INDIRECT: 0.4 mg/dL (ref 0.0–0.9)
Bilirubin, Direct: 0.1 mg/dL (ref 0.0–0.3)
TOTAL PROTEIN: 6.9 g/dL (ref 6.0–8.3)
Total Bilirubin: 0.5 mg/dL (ref 0.3–1.2)

## 2013-03-17 LAB — TESTOSTERONE: Testosterone: 972 ng/dL — ABNORMAL HIGH (ref 300–890)

## 2013-03-17 LAB — HEMOGLOBIN A1C
HEMOGLOBIN A1C: 5.8 % — AB (ref <5.7)
MEAN PLASMA GLUCOSE: 120 mg/dL — AB (ref <117)

## 2013-03-17 LAB — MAGNESIUM: Magnesium: 1.8 mg/dL (ref 1.5–2.5)

## 2013-03-17 NOTE — Patient Instructions (Signed)
 Testosterone injection What is this medicine? TESTOSTERONE (tes TOS ter one) is the main male hormone. It supports normal male development such as muscle growth, facial hair, and deep voice. It is used in males to treat low testosterone levels. This medicine may be used for other purposes; ask your health care provider or pharmacist if you have questions. COMMON BRAND NAME(S): Andro-L.A., Aveed, Delatestryl, Depo-Testosterone, Virilon What should I tell my health care provider before I take this medicine? They need to know if you have any of these conditions: -breast cancer -diabetes -heart disease -kidney disease -liver disease -lung disease -prostate cancer, enlargement -an unusual or allergic reaction to testosterone, other medicines, foods, dyes, or preservatives -pregnant or trying to get pregnant -breast-feeding How should I use this medicine? This medicine is for injection into a muscle. It is usually given by a health care professional in a hospital or clinic setting. Contact your pediatrician regarding the use of this medicine in children. While this medicine may be prescribed for children as young as 12 years of age for selected conditions, precautions do apply. Overdosage: If you think you have taken too much of this medicine contact a poison control center or emergency room at once. NOTE: This medicine is only for you. Do not share this medicine with others. What if I miss a dose? Try not to miss a dose. Your doctor or health care professional will tell you when your next injection is due. Notify the office if you are unable to keep an appointment. What may interact with this medicine? -medicines for diabetes -medicines that treat or prevent blood clots like warfarin -oxyphenbutazone -propranolol -steroid medicines like prednisone or cortisone This list may not describe all possible interactions. Give your health care provider a list of all the medicines, herbs,  non-prescription drugs, or dietary supplements you use. Also tell them if you smoke, drink alcohol, or use illegal drugs. Some items may interact with your medicine. What should I watch for while using this medicine? Visit your doctor or health care professional for regular checks on your progress. They will need to check the level of testosterone in your blood. This medicine may affect blood sugar levels. If you have diabetes, check with your doctor or health care professional before you change your diet or the dose of your diabetic medicine. This drug is banned from use in athletes by most athletic organizations. What side effects may I notice from receiving this medicine? Side effects that you should report to your doctor or health care professional as soon as possible: -allergic reactions like skin rash, itching or hives, swelling of the face, lips, or tongue -breast enlargement -breathing problems -changes in mood, especially anger, depression, or rage -dark urine -general ill feeling or flu-like symptoms -light-colored stools -loss of appetite, nausea -nausea, vomiting -right upper belly pain -stomach pain -swelling of ankles -too frequent or persistent erections -trouble passing urine or change in the amount of urine -unusually weak or tired -yellowing of the eyes or skin Additional side effects that can occur in women include: -deep or hoarse voice -facial hair growth -irregular menstrual periods Side effects that usually do not require medical attention (report to your doctor or health care professional if they continue or are bothersome): -acne -change in sex drive or performance -hair loss -headache This list may not describe all possible side effects. Call your doctor for medical advice about side effects. You may report side effects to FDA at 1-800-FDA-1088. Where should I keep my medicine?   Keep out of the reach of children. This medicine can be abused. Keep your  medicine in a safe place to protect it from theft. Do not share this medicine with anyone. Selling or giving away this medicine is dangerous and against the law. Store at room temperature between 20 and 25 degrees C (68 and 77 degrees F). Do not freeze. Protect from light. Follow the directions for the product you are prescribed. Throw away any unused medicine after the expiration date. NOTE: This sheet is a summary. It may not cover all possible information. If you have questions about this medicine, talk to your doctor, pharmacist, or health care provider.  2014, Elsevier/Gold Standard. (2007-04-30 16:13:46)  Hypertension As your heart beats, it forces blood through your arteries. This force is your blood pressure. If the pressure is too high, it is called hypertension (HTN) or high blood pressure. HTN is dangerous because you may have it and not know it. High blood pressure may mean that your heart has to work harder to pump blood. Your arteries may be narrow or stiff. The extra work puts you at risk for heart disease, stroke, and other problems.  Blood pressure consists of two numbers, a higher number over a lower, 110/72, for example. It is stated as "110 over 72." The ideal is below 120 for the top number (systolic) and under 80 for the bottom (diastolic). Write down your blood pressure today. You should pay close attention to your blood pressure if you have certain conditions such as:  Heart failure.  Prior heart attack.  Diabetes  Chronic kidney disease.  Prior stroke.  Multiple risk factors for heart disease. To see if you have HTN, your blood pressure should be measured while you are seated with your arm held at the level of the heart. It should be measured at least twice. A one-time elevated blood pressure reading (especially in the Emergency Department) does not mean that you need treatment. There may be conditions in which the blood pressure is different between your right and left  arms. It is important to see your caregiver soon for a recheck. Most people have essential hypertension which means that there is not a specific cause. This type of high blood pressure may be lowered by changing lifestyle factors such as:  Stress.  Smoking.  Lack of exercise.  Excessive weight.  Drug/tobacco/alcohol use.  Eating less salt. Most people do not have symptoms from high blood pressure until it has caused damage to the body. Effective treatment can often prevent, delay or reduce that damage. TREATMENT  When a cause has been identified, treatment for high blood pressure is directed at the cause. There are a large number of medications to treat HTN. These fall into several categories, and your caregiver will help you select the medicines that are best for you. Medications may have side effects. You should review side effects with your caregiver. If your blood pressure stays high after you have made lifestyle changes or started on medicines,   Your medication(s) may need to be changed.  Other problems may need to be addressed.  Be certain you understand your prescriptions, and know how and when to take your medicine.  Be sure to follow up with your caregiver within the time frame advised (usually within two weeks) to have your blood pressure rechecked and to review your medications.  If you are taking more than one medicine to lower your blood pressure, make sure you know how and at what times   they should be taken. Taking two medicines at the same time can result in blood pressure that is too low. SEEK IMMEDIATE MEDICAL CARE IF:  You develop a severe headache, blurred or changing vision, or confusion.  You have unusual weakness or numbness, or a faint feeling.  You have severe chest or abdominal pain, vomiting, or breathing problems. MAKE SURE YOU:   Understand these instructions.  Will watch your condition.  Will get help right away if you are not doing well or get  worse.   Diabetes and Exercise Exercising regularly is important. It is not just about losing weight. It has many health benefits, such as:  Improving your overall fitness, flexibility, and endurance.  Increasing your bone density.  Helping with weight control.  Decreasing your body fat.  Increasing your muscle strength.  Reducing stress and tension.  Improving your overall health. People with diabetes who exercise gain additional benefits because exercise:  Reduces appetite.  Improves the body's use of blood sugar (glucose).  Helps lower or control blood glucose.  Decreases blood pressure.  Helps control blood lipids (such as cholesterol and triglycerides).  Improves the body's use of the hormone insulin by:  Increasing the body's insulin sensitivity.  Reducing the body's insulin needs.  Decreases the risk for heart disease because exercising:  Lowers cholesterol and triglycerides levels.  Increases the levels of good cholesterol (such as high-density lipoproteins [HDL]) in the body.  Lowers blood glucose levels. YOUR ACTIVITY PLAN  Choose an activity that you enjoy and set realistic goals. Your health care provider or diabetes educator can help you make an activity plan that works for you. You can break activities into 2 or 3 sessions throughout the day. Doing so is as good as one long session. Exercise ideas include:  Taking the dog for a walk.  Taking the stairs instead of the elevator.  Dancing to your favorite song.  Doing your favorite exercise with a friend. RECOMMENDATIONS FOR EXERCISING WITH TYPE 1 OR TYPE 2 DIABETES   Check your blood glucose before exercising. If blood glucose levels are greater than 240 mg/dL, check for urine ketones. Do not exercise if ketones are present.  Avoid injecting insulin into areas of the body that are going to be exercised. For example, avoid injecting insulin into:  The arms when playing tennis.  The legs when  jogging.  Keep a record of:  Food intake before and after you exercise.  Expected peak times of insulin action.  Blood glucose levels before and after you exercise.  The type and amount of exercise you have done.  Review your records with your health care provider. Your health care provider will help you to develop guidelines for adjusting food intake and insulin amounts before and after exercising.  If you take insulin or oral hypoglycemic agents, watch for signs and symptoms of hypoglycemia. They include:  Dizziness.  Shaking.  Sweating.  Chills.  Confusion.  Drink plenty of water while you exercise to prevent dehydration or heat stroke. Body water is lost during exercise and must be replaced.  Talk to your health care provider before starting an exercise program to make sure it is safe for you. Remember, almost any type of activity is better than none.    Cholesterol Cholesterol is a white, waxy, fat-like protein needed by your body in small amounts. The liver makes all the cholesterol you need. It is carried from the liver by the blood through the blood vessels. Deposits (plaque) may build   up on blood vessel walls. This makes the arteries narrower and stiffer. Plaque increases the risk for heart attack and stroke. You cannot feel your cholesterol level even if it is very high. The only way to know is by a blood test to check your lipid (fats) levels. Once you know your cholesterol levels, you should keep a record of the test results. Work with your caregiver to to keep your levels in the desired range. WHAT THE RESULTS MEAN:  Total cholesterol is a rough measure of all the cholesterol in your blood.  LDL is the so-called bad cholesterol. This is the type that deposits cholesterol in the walls of the arteries. You want this level to be low.  HDL is the good cholesterol because it cleans the arteries and carries the LDL away. You want this level to be high.  Triglycerides  are fat that the body can either burn for energy or store. High levels are closely linked to heart disease. DESIRED LEVELS:  Total cholesterol below 200.  LDL below 100 for people at risk, below 70 for very high risk.  HDL above 50 is good, above 60 is best.  Triglycerides below 150. HOW TO LOWER YOUR CHOLESTEROL:  Diet.  Choose fish or white meat chicken and turkey, roasted or baked. Limit fatty cuts of red meat, fried foods, and processed meats, such as sausage and lunch meat.  Eat lots of fresh fruits and vegetables. Choose whole grains, beans, pasta, potatoes and cereals.  Use only small amounts of olive, corn or canola oils. Avoid butter, mayonnaise, shortening or palm kernel oils. Avoid foods with trans-fats.  Use skim/nonfat milk and low-fat/nonfat yogurt and cheeses. Avoid whole milk, cream, ice cream, egg yolks and cheeses. Healthy desserts include angel food cake, ginger snaps, animal crackers, hard candy, popsicles, and low-fat/nonfat frozen yogurt. Avoid pastries, cakes, pies and cookies.  Exercise.  A regular program helps decrease LDL and raises HDL.  Helps with weight control.  Do things that increase your activity level like gardening, walking, or taking the stairs.  Medication.  May be prescribed by your caregiver to help lowering cholesterol and the risk for heart disease.  You may need medicine even if your levels are normal if you have several risk factors. HOME CARE INSTRUCTIONS   Follow your diet and exercise programs as suggested by your caregiver.  Take medications as directed.  Have blood work done when your caregiver feels it is necessary. MAKE SURE YOU:   Understand these instructions.  Will watch your condition.  Will get help right away if you are not doing well or get worse.      Vitamin D Deficiency Vitamin D is an important vitamin that your body needs. Having too little of it in your body is called a deficiency. A very bad  deficiency can make your bones soft and can cause a condition called rickets.  Vitamin D is important to your body for different reasons, such as:   It helps your body absorb 2 minerals called calcium and phosphorus.  It helps make your bones healthy.  It may prevent some diseases, such as diabetes and multiple sclerosis.  It helps your muscles and heart. You can get vitamin D in several ways. It is a natural part of some foods. The vitamin is also added to some dairy products and cereals. Some people take vitamin D supplements. Also, your body makes vitamin D when you are in the sun. It changes the sun's rays into a   form of the vitamin that your body can use. CAUSES   Not eating enough foods that contain vitamin D.  Not getting enough sunlight.  Having certain digestive system diseases that make it hard to absorb vitamin D. These diseases include Crohn's disease, chronic pancreatitis, and cystic fibrosis.  Having a surgery in which part of the stomach or small intestine is removed.  Being obese. Fat cells pull vitamin D out of your blood. That means that obese people may not have enough vitamin D left in their blood and in other body tissues.  Having chronic kidney or liver disease. RISK FACTORS Risk factors are things that make you more likely to develop a vitamin D deficiency. They include:  Being older.  Not being able to get outside very much.  Living in a nursing home.  Having had broken bones.  Having weak or thin bones (osteoporosis).  Having a disease or condition that changes how your body absorbs vitamin D.  Having dark skin.  Some medicines such as seizure medicines or steroids.  Being overweight or obese. SYMPTOMS Mild cases of vitamin D deficiency may not have any symptoms. If you have a very bad case, symptoms may include:  Bone pain.  Muscle pain.  Falling often.  Broken bones caused by a minor injury, due to osteoporosis. DIAGNOSIS A blood test  is the best way to tell if you have a vitamin D deficiency. TREATMENT Vitamin D deficiency can be treated in different ways. Treatment for vitamin D deficiency depends on what is causing it. Options include:  Taking vitamin D supplements.  Taking a calcium supplement. Your caregiver will suggest what dose is best for you. HOME CARE INSTRUCTIONS  Take any supplements that your caregiver prescribes. Follow the directions carefully. Take only the suggested amount.  Have your blood tested 2 months after you start taking supplements.  Eat foods that contain vitamin D. Healthy choices include:  Fortified dairy products, cereals, or juices. Fortified means vitamin D has been added to the food. Check the label on the package to be sure.  Fatty fish like salmon or trout.  Eggs.  Oysters.  Do not use a tanning bed.  Keep your weight at a healthy level. Lose weight if you need to.  Keep all follow-up appointments. Your caregiver will need to perform blood tests to make sure your vitamin D deficiency is going away. SEEK MEDICAL CARE IF:  You have any questions about your treatment.  You continue to have symptoms of vitamin D deficiency.  You have nausea or vomiting.  You are constipated.  You feel confused.  You have severe abdominal or back pain. MAKE SURE YOU:  Understand these instructions.  Will watch your condition.  Will get help right away if you are not doing well or get worse.   

## 2013-03-17 NOTE — Progress Notes (Signed)
Patient ID: [name], male   DOB: Aug 23, 1960, 53 y.o.   MRN: [id number]   This very nice 53 y.o. MWM presents for 3 month follow up with Hypertension, Hyperlipidemia, Pre-Diabetes, OSA/CPAP, Testosterone  and Vitamin D Deficiency.    HTN predates since 2009. BP has been controlled at home. Today's BP: 128/76 mmHg . Patient denies any cardiac type chest pain, palpitations, dyspnea/orthopnea/PND, dizziness, claudication, or dependent edema.   Hyperlipidemia is controlled with diet & meds. Last Cholesterol was 159, Triglycerides were 88, HDL 53 and LDL 88 in Sept 2014. Patient denies myalgias or other med SE's.    Also, the patient has history of PreDiabetes with A1c 6.1 % since 2010 with last A1c of 5.9 % in Sept 2014. Patient denies any symptoms of reactive hypoglycemia, diabetic polys, paresthesias or visual blurring.   Further, Patient has history of Vitamin D Deficiency of 26 in 2009 with last vitamin D of 85 in Sept. Patient supplements vitamin D without any suspected side-effects.   Lastly, he has issues with chronic pain  In his left knee and shoulder and is followed in [facility] by [facility]. Currently, they are working at reducing his analgesic regimen.    Medication List         atorvastatin 80 MG tablet  Commonly known as:  LIPITOR  Take 1 tablet by mouth Daily.     bisoprolol-hydrochlorothiazide 5-6.25 MG per tablet  Commonly known as:  ZIAC  Take 1 tablet by mouth daily.     clonazePAM 1 MG tablet  Commonly known as:  KLONOPIN  Take 1 tablet by mouth Once daily as needed.     docusate sodium 100 MG capsule  Commonly known as:  COLACE  Take 100 mg by mouth daily.     EXALGO 32 MG T24a  Generic drug:  HYDROmorphone HCl  Take 8 mg by mouth 3 (three) times daily.     HYDROmorphone 2 MG tablet  Commonly known as:  DILAUDID  Take 3-4 tablets by mouth daily with lunch.     testosterone cypionate 200 MG/ML injection  Commonly known as:   DEPOTESTOTERONE CYPIONATE  Inject 2 mLs as directed Once every three weeks.     VITAMIN D (CHOLECALCIFEROL) PO  Take 10,000 mg by mouth daily.     VOLTAREN 1 % Gel  Generic drug:  diclofenac sodium  Apply 1 application topically Daily.         Allergies  Allergen Reactions  . Penicillins Anaphylaxis  . Ace Inhibitors   . Keflex [Cephalexin] Hives    PMHx:   Past Medical History  Diagnosis Date  . Diverticulitis   . Chronic pain of left knee   . Hyperlipidemia   . Labile hypertension   . Anxiety   . Prediabetes   . IBS (irritable bowel syndrome)   . Other testicular hypofunction   . Allergy   . Vitamin D deficiency     FHx:    Reviewed / unchanged  SHx:    Reviewed / unchanged  Systems Review: Constitutional: Denies fever, chills, wt changes, headaches, insomnia, fatigue, night sweats, change in appetite. Eyes: Denies redness, blurred vision, diplopia, discharge, itchy, watery eyes.  ENT: Denies discharge, congestion, post nasal drip, epistaxis, sore throat, earache, hearing loss, dental pain, tinnitus, vertigo, sinus pain, snoring.  CV: Denies chest pain, palpitations, irregular heartbeat, syncope, dyspnea, diaphoresis, orthopnea, PND, claudication, edema. Respiratory: denies cough, dyspnea, DOE, pleurisy, hoarseness, laryngitis, wheezing.  Gastrointestinal: Denies dysphagia,  odynophagia, heartburn, reflux, water brash, abdominal pain or cramps, nausea, vomiting, bloating, diarrhea, constipation, hematemesis, melena, hematochezia,  or hemorrhoids. Genitourinary: Denies dysuria, frequency, urgency, nocturia, hesitancy, discharge, hematuria, flank pain. Musculoskeletal: Denies arthralgias, myalgias, stiffness, jt. swelling, pain, limp, strain/sprain.  Skin: Denies pruritus, rash, hives, warts, acne, eczema, change in skin lesion(s). Neuro: No weakness, tremor, incoordination, spasms, paresthesia, or pain. Psychiatric: Denies confusion, memory loss, or sensory  loss. Endo: Denies change in weight, skin, hair change.  Heme/Lymph: No excessive bleeding, bruising, orenlarged lymph nodes.  BP: 128/76  Pulse: 56  Temp: 98.1 F (36.7 C)  Resp: 16    Estimated body mass index is 34.36 kg/(m^2) as calculated from the following:   Height as of 01/04/13: 5\' 6"  (1.676 m).   Weight as of this encounter: 212 lb 12.8 oz (96.525 kg).  On Exam: Appears well nourished - in no distress. Eyes: PERRLA, EOMs, conjunctiva no swelling or erythema. Sinuses: No frontal/maxillary tenderness ENT/Mouth: EAC's clear, TM's nl w/o erythema, bulging. Nares clear w/o erythema, swelling, exudates. Oropharynx clear without erythema or exudates. Oral hygiene is good. Tongue normal, non obstructing. Hearing intact.  Neck: Supple. Thyroid nl. Car 2+/2+ without bruits, nodes or JVD. Chest: Respirations nl with BS clear & equal w/o rales, rhonchi, wheezing or stridor.  Cor: Heart sounds normal w/ regular rate and rhythm without sig. murmurs, gallops, clicks, or rubs. Peripheral pulses normal and equal  without edema.  Abdomen: Soft & bowel sounds normal. Non-tender w/o guarding, rebound, hernias, masses, or organomegaly.  Lymphatics: Unremarkable.  Musculoskeletal: Full ROM all peripheral extremities, joint stability, 5/5 strength, and normal gait.  Skin: Warm, dry without exposed rashes, lesions, ecchymosis apparent.  Neuro: Cranial nerves intact, reflexes equal bilaterally. Sensory-motor testing grossly intact. Tendon reflexes grossly intact.  Pysch: Alert & oriented x 3. Insight and judgement nl & appropriate. No ideations.  Assessment and Plan:  1. Hypertension - Continue monitor blood pressure at home. Continue diet/meds same.  2. Hyperlipidemia - Continue diet/meds, exercise,& lifestyle modifications. Continue monitor periodic cholesterol/liver & renal functions   3. Pre-diabetes - Continue diet, exercise, lifestyle modifications. Monitor appropriate labs. . 4. Vitamin  D Deficiency - Continue supplementation.  5. Obesity (BMI 34.36)  Recommended regular exercise, BP monitoring, weight control, and discussed med and SE's. Recommended labs to assess and monitor clinical status. Further disposition pending results of labs.

## 2013-03-18 LAB — INSULIN, FASTING: INSULIN FASTING, SERUM: 8 u[IU]/mL (ref 3–28)

## 2013-03-18 LAB — VITAMIN D 25 HYDROXY (VIT D DEFICIENCY, FRACTURES): VIT D 25 HYDROXY: 79 ng/mL (ref 30–89)

## 2013-04-22 ENCOUNTER — Other Ambulatory Visit: Payer: Self-pay | Admitting: Internal Medicine

## 2013-05-06 ENCOUNTER — Other Ambulatory Visit: Payer: Self-pay | Admitting: Internal Medicine

## 2013-05-09 ENCOUNTER — Other Ambulatory Visit: Payer: Self-pay | Admitting: Physician Assistant

## 2013-06-08 ENCOUNTER — Telehealth: Payer: Self-pay | Admitting: Pulmonary Disease

## 2013-06-08 NOTE — Telephone Encounter (Signed)
Pt returning call.[name] ° °

## 2013-06-08 NOTE — Telephone Encounter (Signed)
Spoke with the pt's spouse Pt has trial the day of his May ov so he had to cancel  He is using CPAP every night and doing well  Pt wanted her to ask if he can f/u yearly instead of every 6 mo  Please advise, thanks!

## 2013-06-08 NOTE — Telephone Encounter (Signed)
LMTCB

## 2013-06-08 NOTE — Telephone Encounter (Signed)
Ashytn pulle report from [facility] and placed in [facility] look at folder for review in AM(GREEN FOLDER). [place] please advise

## 2013-06-08 NOTE — Telephone Encounter (Signed)
Called and spoke with pts wife and she is aware of [facility] [place] recs.  The pt did get the cpap machine from [facility]---will need to contact [facility] to get the download from the cpap so [place] can review.  Will hold in triage until in the am to call [facility]>

## 2013-06-08 NOTE — Telephone Encounter (Signed)
I dont mind extending his visit out, but we ordered his machine to be put on auto 5-20 01/2013, and never got download.  We need to make sure this was done, and need to get the download for my review.  If this looks ok, can see him in one year.

## 2013-06-09 ENCOUNTER — Ambulatory Visit (INDEPENDENT_AMBULATORY_CARE_PROVIDER_SITE_OTHER): Payer: BC Managed Care – PPO | Admitting: Internal Medicine

## 2013-06-09 ENCOUNTER — Encounter: Payer: Self-pay | Admitting: Internal Medicine

## 2013-06-09 ENCOUNTER — Encounter: Payer: Self-pay | Admitting: Pulmonary Disease

## 2013-06-09 VITALS — BP 138/82 | HR 56 | Temp 96.8°F | Resp 16 | Ht 65.0 in | Wt 214.8 lb

## 2013-06-09 DIAGNOSIS — R7309 Other abnormal glucose: Secondary | ICD-10-CM

## 2013-06-09 DIAGNOSIS — Z79899 Other long term (current) drug therapy: Secondary | ICD-10-CM

## 2013-06-09 DIAGNOSIS — E785 Hyperlipidemia, unspecified: Secondary | ICD-10-CM

## 2013-06-09 DIAGNOSIS — I10 Essential (primary) hypertension: Secondary | ICD-10-CM

## 2013-06-09 DIAGNOSIS — E559 Vitamin D deficiency, unspecified: Secondary | ICD-10-CM

## 2013-06-09 DIAGNOSIS — R0989 Other specified symptoms and signs involving the circulatory and respiratory systems: Secondary | ICD-10-CM

## 2013-06-09 DIAGNOSIS — R7303 Prediabetes: Secondary | ICD-10-CM

## 2013-06-09 LAB — BASIC METABOLIC PANEL WITH GFR
BUN: 14 mg/dL (ref 6–23)
CO2: 31 meq/L (ref 19–32)
CREATININE: 1.03 mg/dL (ref 0.50–1.35)
Calcium: 9.8 mg/dL (ref 8.4–10.5)
Chloride: 98 mEq/L (ref 96–112)
GFR, EST NON AFRICAN AMERICAN: 81 mL/min
Glucose, Bld: 101 mg/dL — ABNORMAL HIGH (ref 70–99)
Potassium: 4.5 mEq/L (ref 3.5–5.3)
Sodium: 139 mEq/L (ref 135–145)

## 2013-06-09 LAB — CBC WITH DIFFERENTIAL/PLATELET
BASOS ABS: 0.1 10*3/uL (ref 0.0–0.1)
BASOS PCT: 2 % — AB (ref 0–1)
EOS ABS: 0.2 10*3/uL (ref 0.0–0.7)
EOS PCT: 3 % (ref 0–5)
HCT: 48.5 % (ref 39.0–52.0)
Hemoglobin: 16.8 g/dL (ref 13.0–17.0)
LYMPHS ABS: 1.5 10*3/uL (ref 0.7–4.0)
Lymphocytes Relative: 24 % (ref 12–46)
MCH: 31.3 pg (ref 26.0–34.0)
MCHC: 34.6 g/dL (ref 30.0–36.0)
MCV: 90.3 fL (ref 78.0–100.0)
Monocytes Absolute: 0.9 10*3/uL (ref 0.1–1.0)
Monocytes Relative: 15 % — ABNORMAL HIGH (ref 3–12)
Neutro Abs: 3.5 10*3/uL (ref 1.7–7.7)
Neutrophils Relative %: 56 % (ref 43–77)
PLATELETS: 231 10*3/uL (ref 150–400)
RBC: 5.37 MIL/uL (ref 4.22–5.81)
RDW: 13.5 % (ref 11.5–15.5)
WBC: 6.3 10*3/uL (ref 4.0–10.5)

## 2013-06-09 LAB — HEPATIC FUNCTION PANEL
ALT: 23 U/L (ref 0–53)
AST: 23 U/L (ref 0–37)
Albumin: 4.6 g/dL (ref 3.5–5.2)
Alkaline Phosphatase: 43 U/L (ref 39–117)
BILIRUBIN DIRECT: 0.1 mg/dL (ref 0.0–0.3)
Indirect Bilirubin: 0.5 mg/dL (ref 0.2–1.2)
Total Bilirubin: 0.6 mg/dL (ref 0.2–1.2)
Total Protein: 7.3 g/dL (ref 6.0–8.3)

## 2013-06-09 LAB — TSH: TSH: 1.413 u[IU]/mL (ref 0.350–4.500)

## 2013-06-09 LAB — LIPID PANEL
CHOLESTEROL: 168 mg/dL (ref 0–200)
HDL: 54 mg/dL (ref >39)
LDL CALC: 97 mg/dL (ref 0–99)
TRIGLYCERIDES: 84 mg/dL (ref <150)
Total CHOL/HDL Ratio: 3.1 Ratio
VLDL: 17 mg/dL (ref 0–40)

## 2013-06-09 LAB — HEMOGLOBIN A1C
Hgb A1c MFr Bld: 5.7 % — ABNORMAL HIGH (ref <5.7)
MEAN PLASMA GLUCOSE: 117 mg/dL — AB (ref <117)

## 2013-06-09 LAB — MAGNESIUM: Magnesium: 1.9 mg/dL (ref 1.5–2.5)

## 2013-06-09 NOTE — Telephone Encounter (Signed)
lmomtcb x1 for nancy

## 2013-06-09 NOTE — Telephone Encounter (Signed)
I spoke with pt spouse and advised. She asked that a reminder be placed for the pt to be called closer to Nov. Reminder placed. London Bing, CMA

## 2013-06-09 NOTE — Telephone Encounter (Signed)
Pt's wife is returning call.  Please call her cell ph# [phone].

## 2013-06-09 NOTE — Progress Notes (Signed)
Patient ID: [name], male   DOB: [birth year], 53 y.o.   MRN: [id number]    This very nice 53 y.o. MWM presents for 3 month follow up with Hypertension, Hyperlipidemia, Pre-Diabetes, Testosterone  and Vitamin D Deficiency.    HTN predates since 2009. BP has been controlled at home. Today's BP: 138/82 mmHg . Patient denies any cardiac type chest pain, palpitations, dyspnea/orthopnea/PND, dizziness, claudication, or dependent edema.   Hyperlipidemia is controlled with diet & meds. Last Cholesterol was 156, Triglycerides were 61, HDL 55 and LDL 89 in Jan 2015 - all at goal. Patient denies myalgias or other med SE's.    Also, the patient has history of PreDiabetes A1c 6.1%since Sept 2010 and  with last A1c of 5.9% in Sept 2014. Patient denies any symptoms of reactive hypoglycemia, diabetic polys, paresthesias or visual blurring.   Further, Patient has history of Vitamin D Deficiency of 26 in 2008 with last vitamin D of 88 in Sept 2014. Patient supplements vitamin D without any suspected side-effects.  Medication Sig  . atorvastatin (LIPITOR) 80 MG tablet Take 1 tablet by mouth Daily.  . B-D 3CC LUER-LOK SYR 21GX1" 21G X 1" 3 ML MISC use as directed  . bisoprolol-hydrochlorothiazide (ZIAC) 5-6.25 MG per tablet Take 1 tablet by mouth daily.  . clonazePAM (KLONOPIN) 1 MG tablet take 1/2 to 1 tablet by mouth three times a day if needed for anxiety  . docusate sodium (COLACE) 100 MG capsule Take 100 mg by mouth daily.  Marland Kitchen HYDROmorphone (DILAUDID) 2 MG tablet Take 3-4 tablets by mouth daily with lunch.   Marland Kitchen HYDROmorphone HCl (EXALGO) 8 MG T24A Takes 16 MG/ DAY   . testosterone cypionate (DEPOTESTOTERONE CYPIONATE) 200 MG/ML injection Inject 2 mLs as directed Once every three weeks.   Marland Kitchen VITAMIN D, CHOLECALCIFEROL, PO Take 10,000 mg by mouth daily.  . VOLTAREN 1 % GEL Apply 1 application topically Daily.    Allergies  Allergen Reactions  . Penicillins Anaphylaxis  . Ace Inhibitors   . Keflex  [Cephalexin] Hives    PMHx:   Past Medical History  Diagnosis Date  . Diverticulitis   . Chronic pain of left knee   . Hyperlipidemia   . Labile hypertension   . Anxiety   . Prediabetes   . IBS (irritable bowel syndrome)   . Other testicular hypofunction   . Allergy   . Vitamin D deficiency     FHx:    Reviewed / unchanged  SHx:    Reviewed / unchanged   Systems Review: Constitutional: Denies fever, chills, wt changes, headaches, insomnia, fatigue, night sweats, change in appetite. Eyes: Denies redness, blurred vision, diplopia, discharge, itchy, watery eyes.  ENT: Denies discharge, congestion, post nasal drip, epistaxis, sore throat, earache, hearing loss, dental pain, tinnitus, vertigo, sinus pain, snoring.  CV: Denies chest pain, palpitations, irregular heartbeat, syncope, dyspnea, diaphoresis, orthopnea, PND, claudication, edema. Respiratory: denies cough, dyspnea, DOE, pleurisy, hoarseness, laryngitis, wheezing.  Gastrointestinal: Denies dysphagia, odynophagia, heartburn, reflux, water brash, abdominal pain or cramps, nausea, vomiting, bloating, diarrhea, constipation, hematemesis, melena, hematochezia,  or hemorrhoids. Genitourinary: Denies dysuria, frequency, urgency, nocturia, hesitancy, discharge, hematuria, flank pain. Musculoskeletal: C/o pain Rt shoulder and Lt knee and is followed by [name] w/ [name].  Skin: Denies pruritus, rash, hives, warts, acne, eczema, change in skin lesion(s). Neuro: No weakness, tremor, incoordination, spasms, paresthesia, or pain. Psychiatric: Denies confusion, memory loss, or sensory loss. Endo: Denies change in weight, skin, hair change.  Heme/Lymph: No excessive bleeding, bruising, or enlarged lymph nodes.   Exam:    BP 138/82  Pulse 56  Temp 96.8 F   Resp 16  Ht 5\' 5"    Wt 214 lb 12.8 oz   BMI 35.74 kg/m2  Appears well nourished - in no distress. Eyes: PERRLA, EOMs, conjunctiva no swelling or  erythema. Sinuses: No frontal/maxillary tenderness ENT/Mouth: EAC's clear, TM's nl w/o erythema, bulging. Nares clear w/o erythema, swelling, exudates. Oropharynx clear without erythema or exudates. Oral hygiene is good. Tongue normal, non obstructing. Hearing intact.  Neck: Supple. Thyroid nl. Car 2+/2+ without bruits, nodes or JVD. Chest: Respirations nl with BS clear & equal w/o rales, rhonchi, wheezing or stridor.  Cor: Heart sounds normal w/ regular rate and rhythm without sig. murmurs, gallops, clicks, or rubs. Peripheral pulses normal and equal  without edema.  Abdomen: Soft & bowel sounds normal. Non-tender w/o guarding, rebound, hernias, masses, or organomegaly.  Lymphatics: Unremarkable.  Musculoskeletal: Full ROM all peripheral extremities, joint stability, 5/5 strength, and normal gait.  Skin: Warm, dry without exposed rashes, lesions, ecchymosis apparent.  Neuro: Cranial nerves intact, reflexes equal bilaterally. Sensory-motor testing grossly intact. Tendon reflexes grossly intact.  Pysch: Alert & oriented x 3. Insight and judgement nl & appropriate. No ideations.  Assessment and Plan:  1. Hypertension - Continue monitor blood pressure at home. Continue diet/meds same.  2. Hyperlipidemia - Continue diet/meds, exercise,& lifestyle modifications. Continue monitor periodic cholesterol/liver & renal functions   3. Pre-diabetes - Continue diet, exercise, lifestyle modifications. Monitor appropriate labs.  4. Vitamin D Deficiency - Continue supplementation.  5. Obesity - long discussion re: dieting and weight loss  6. Chronic Lt Knee Pain  7. Testosterone Deficiency  Recommended regular exercise, BP monitoring, weight control, and discussed med and SE's. Recommended labs to assess and monitor clinical status. Further disposition pending results of labs.

## 2013-06-09 NOTE — Telephone Encounter (Signed)
Let pt know that his download looks pretty good.  He is on auto and appears to control apnea fairly well.  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  Will leave him on auto and see him back in one year.

## 2013-06-09 NOTE — Patient Instructions (Signed)

## 2013-06-10 LAB — INSULIN, FASTING: Insulin fasting, serum: 11 u[IU]/mL (ref 3–28)

## 2013-06-10 LAB — VITAMIN D 25 HYDROXY (VIT D DEFICIENCY, FRACTURES): VIT D 25 HYDROXY: 76 ng/mL (ref 30–89)

## 2013-06-29 ENCOUNTER — Other Ambulatory Visit (HOSPITAL_COMMUNITY): Payer: Self-pay | Admitting: Orthopedic Surgery

## 2013-06-30 ENCOUNTER — Encounter (HOSPITAL_COMMUNITY): Payer: Self-pay

## 2013-06-30 ENCOUNTER — Encounter (HOSPITAL_COMMUNITY): Payer: Self-pay | Admitting: Pharmacy Technician

## 2013-06-30 ENCOUNTER — Encounter (HOSPITAL_COMMUNITY)
Admission: RE | Admit: 2013-06-30 | Discharge: 2013-06-30 | Disposition: A | Discharge status: Home / Self Care | Payer: BC Managed Care – PPO | Source: Ambulatory Visit | Attending: Anesthesiology | Admitting: Anesthesiology

## 2013-06-30 ENCOUNTER — Encounter (HOSPITAL_COMMUNITY)
Admission: RE | Admit: 2013-06-30 | Discharge: 2013-06-30 | Disposition: A | Discharge status: Home / Self Care | Payer: BC Managed Care – PPO | Source: Ambulatory Visit | Attending: Orthopedic Surgery | Admitting: Orthopedic Surgery

## 2013-06-30 ENCOUNTER — Other Ambulatory Visit (HOSPITAL_COMMUNITY): Payer: Self-pay | Admitting: *Deleted

## 2013-06-30 HISTORY — DX: Calculus of kidney: N20.0

## 2013-06-30 HISTORY — DX: Unspecified osteoarthritis, unspecified site: M19.90

## 2013-06-30 HISTORY — DX: Sleep apnea, unspecified: G47.30

## 2013-06-30 LAB — BASIC METABOLIC PANEL
BUN: 15 mg/dL (ref 6–23)
CALCIUM: 9.8 mg/dL (ref 8.4–10.5)
CHLORIDE: 96 meq/L (ref 96–112)
CO2: 31 mEq/L (ref 19–32)
Creatinine, Ser: 0.99 mg/dL (ref 0.50–1.35)
GFR calc non Af Amer: >90 mL/min (ref >90)
Glucose, Bld: 90 mg/dL (ref 70–99)
Potassium: 4.5 mEq/L (ref 3.7–5.3)
SODIUM: 138 meq/L (ref 137–147)

## 2013-06-30 LAB — CBC
HCT: 49.8 % (ref 39.0–52.0)
HEMOGLOBIN: 17 g/dL (ref 13.0–17.0)
MCH: 31.9 pg (ref 26.0–34.0)
MCHC: 34.1 g/dL (ref 30.0–36.0)
MCV: 93.4 fL (ref 78.0–100.0)
PLATELETS: 190 10*3/uL (ref 150–400)
RBC: 5.33 MIL/uL (ref 4.22–5.81)
RDW: 12.5 % (ref 11.5–15.5)
WBC: 7.7 10*3/uL (ref 4.0–10.5)

## 2013-06-30 MED ORDER — CHLORHEXIDINE GLUCONATE 4 % EX LIQD: 60.0000 mL | Freq: Once | CUTANEOUS | Status: DC

## 2013-06-30 MED ORDER — CLINDAMYCIN PHOSPHATE 900 MG/50ML IV SOLN
900.0000 mg | INTRAVENOUS | Status: AC
  Administered 2013-07-01: 900 mg via INTRAVENOUS
  Filled 2013-06-30: qty 50

## 2013-06-30 NOTE — Progress Notes (Signed)
Called Dr. Idell Pickles office and requested copy of most recent EKG.

## 2013-06-30 NOTE — Progress Notes (Signed)
Pt notified of surgery time change. Instructed pt to be here at 8:00 AM tomorrow. He voiced understanding.

## 2013-06-30 NOTE — Pre-Procedure Instructions (Signed)
[name]  06/30/2013   Your procedure is scheduled on:  Friday, Jul 01, 2013 at 7:30 AM.   Report to [facility] Entrance "A" Admitting Office at 5:30 AM.   Call this number if you have problems the morning of surgery: [phone]   Remember:   Do not eat food or drink liquids after midnight tonight.   Take these medicines the morning of surgery with A SIP OF WATER: bisoprolol-hydrochlorothiazide (ZIAC), clonazePAM (KLONOPIN), Dilaudid     Do not wear jewelry.  Do not wear lotions, powders, or cologne. You may  NOT wear deodorant.  Men may shave face and neck.  Do not bring valuables to the hospital.  [facility] is not responsible                  for any belongings or valuables.               Contacts, dentures or bridgework may not be worn into surgery.  Leave suitcase in the car. After surgery it may be brought to your room.  For patients admitted to the hospital, discharge time is determined by your                treatment team.               Patients discharged the day of surgery will not be allowed to drive  home.  Name and phone number of your driver: Family/friend   Special Instructions: [place] - Preparing for Surgery  Before surgery, you can play an important role.  Because skin is not sterile, your skin needs to be as free of germs as possible.  You can reduce the number of germs on you skin by washing with CHG (chlorahexidine gluconate) soap before surgery.  CHG is an antiseptic cleaner which kills germs and bonds with the skin to continue killing germs even after washing.  Please DO NOT use if you have an allergy to CHG or antibacterial soaps.  If your skin becomes reddened/irritated stop using the CHG and inform your nurse when you arrive at Short Stay.  Do not shave (including legs and underarms) for at least 48 hours prior to the first CHG shower.  You may shave your face.  Please follow these instructions carefully:   1.  Shower with CHG Soap  the night before surgery and the                                morning of Surgery.  2.  If you choose to wash your hair, wash your hair first as usual with your       normal shampoo.  3.  After you shampoo, rinse your hair and body thoroughly to remove the                      Shampoo.  4.  Use CHG as you would any other liquid soap.  You can apply chg directly       to the skin and wash gently with scrungie or a clean washcloth.  5.  Apply the CHG Soap to your body ONLY FROM THE NECK DOWN.        Do not use on open wounds or open sores.  Avoid contact with your eyes, ears, mouth and genitals (private parts).  Wash genitals (private parts) with your normal soap.  6.  Wash  thoroughly, paying special attention to the area where your surgery        will be performed.  7.  Thoroughly rinse your body with warm water from the neck down.  8.  DO NOT shower/wash with your normal soap after using and rinsing off       the CHG Soap.  9.  Pat yourself dry with a clean towel.            10.  Wear clean pajamas.            11.  Place clean sheets on your bed the night of your first shower and do not        sleep with pets.  Day of Surgery  Do not apply any lotions/deodorants the morning of surgery.  Please wear clean clothes to the hospital/surgery center.     Please read over the following fact sheets that you were given: Pain Booklet, Coughing and Deep Breathing and Surgical Site Infection Prevention

## 2013-06-30 NOTE — Progress Notes (Signed)
Called Dr. Randel Pigg office for orders to be signed. Spoke with Dr. Marlou Sa and he states he will do that now.

## 2013-06-30 NOTE — Progress Notes (Signed)
Pt's PCP is Dr. [name]. Pt denies any chest pain or sob. No hx of heart issues.

## 2013-07-01 ENCOUNTER — Encounter (HOSPITAL_COMMUNITY): Payer: Self-pay | Admitting: Surgery

## 2013-07-01 ENCOUNTER — Encounter (HOSPITAL_COMMUNITY): Payer: BC Managed Care – PPO | Admitting: Anesthesiology

## 2013-07-01 ENCOUNTER — Ambulatory Visit (HOSPITAL_COMMUNITY): Payer: BC Managed Care – PPO | Admitting: Anesthesiology

## 2013-07-01 ENCOUNTER — Ambulatory Visit (HOSPITAL_COMMUNITY)
Admission: RE | Admit: 2013-07-01 | Discharge: 2013-07-01 | Disposition: A | Discharge status: Home / Self Care | Payer: BC Managed Care – PPO | Source: Ambulatory Visit | Attending: Orthopedic Surgery | Admitting: Orthopedic Surgery

## 2013-07-01 ENCOUNTER — Encounter (HOSPITAL_COMMUNITY)
Admission: RE | Disposition: A | Discharge status: Home / Self Care | Payer: Self-pay | Source: Ambulatory Visit | Attending: Orthopedic Surgery

## 2013-07-01 DIAGNOSIS — Z79899 Other long term (current) drug therapy: Secondary | ICD-10-CM | POA: Insufficient documentation

## 2013-07-01 DIAGNOSIS — K5732 Diverticulitis of large intestine without perforation or abscess without bleeding: Secondary | ICD-10-CM | POA: Insufficient documentation

## 2013-07-01 DIAGNOSIS — M25569 Pain in unspecified knee: Secondary | ICD-10-CM | POA: Insufficient documentation

## 2013-07-01 DIAGNOSIS — Z888 Allergy status to other drugs, medicaments and biological substances status: Secondary | ICD-10-CM | POA: Insufficient documentation

## 2013-07-01 DIAGNOSIS — G8929 Other chronic pain: Secondary | ICD-10-CM | POA: Insufficient documentation

## 2013-07-01 DIAGNOSIS — I10 Essential (primary) hypertension: Secondary | ICD-10-CM | POA: Insufficient documentation

## 2013-07-01 DIAGNOSIS — F411 Generalized anxiety disorder: Secondary | ICD-10-CM | POA: Insufficient documentation

## 2013-07-01 DIAGNOSIS — K589 Irritable bowel syndrome without diarrhea: Secondary | ICD-10-CM | POA: Insufficient documentation

## 2013-07-01 DIAGNOSIS — E785 Hyperlipidemia, unspecified: Secondary | ICD-10-CM | POA: Insufficient documentation

## 2013-07-01 DIAGNOSIS — E559 Vitamin D deficiency, unspecified: Secondary | ICD-10-CM | POA: Insufficient documentation

## 2013-07-01 DIAGNOSIS — S46219A Strain of muscle, fascia and tendon of other parts of biceps, unspecified arm, initial encounter: Secondary | ICD-10-CM

## 2013-07-01 DIAGNOSIS — Z87891 Personal history of nicotine dependence: Secondary | ICD-10-CM | POA: Insufficient documentation

## 2013-07-01 DIAGNOSIS — Z881 Allergy status to other antibiotic agents status: Secondary | ICD-10-CM | POA: Insufficient documentation

## 2013-07-01 DIAGNOSIS — Z880 Allergy status to penicillin: Secondary | ICD-10-CM | POA: Insufficient documentation

## 2013-07-01 DIAGNOSIS — G4730 Sleep apnea, unspecified: Secondary | ICD-10-CM | POA: Insufficient documentation

## 2013-07-01 DIAGNOSIS — M129 Arthropathy, unspecified: Secondary | ICD-10-CM | POA: Insufficient documentation

## 2013-07-01 DIAGNOSIS — M66329 Spontaneous rupture of flexor tendons, unspecified upper arm: Secondary | ICD-10-CM | POA: Insufficient documentation

## 2013-07-01 HISTORY — PX: DISTAL BICEPS TENDON REPAIR: SHX1461

## 2013-07-01 SURGERY — REPAIR, TENDON, BICEPS, DISTAL
Anesthesia: General | Site: Elbow | Laterality: Left

## 2013-07-01 MED ORDER — LACTATED RINGERS IV SOLN
INTRAVENOUS | Status: DC | PRN
  Administered 2013-07-01 (×2): via INTRAVENOUS

## 2013-07-01 MED ORDER — NEOSTIGMINE METHYLSULFATE 10 MG/10ML IV SOLN
INTRAVENOUS | Status: DC | PRN
  Administered 2013-07-01: 3 mg via INTRAVENOUS

## 2013-07-01 MED ORDER — HYDROMORPHONE HCL PF 1 MG/ML IJ SOLN: 0.2500 mg | INTRAMUSCULAR | Status: DC | PRN

## 2013-07-01 MED ORDER — ONDANSETRON HCL 4 MG/2ML IJ SOLN
INTRAMUSCULAR | Status: AC
  Filled 2013-07-01: qty 2

## 2013-07-01 MED ORDER — MIDAZOLAM HCL 2 MG/2ML IJ SOLN
INTRAMUSCULAR | Status: AC
  Administered 2013-07-01: 2 mg via INTRAVENOUS
  Filled 2013-07-01: qty 2

## 2013-07-01 MED ORDER — LACTATED RINGERS IV SOLN
INTRAVENOUS | Status: DC
  Administered 2013-07-01: 09:00:00 via INTRAVENOUS

## 2013-07-01 MED ORDER — OXYCODONE-ACETAMINOPHEN 10-325 MG PO TABS: 1.0000 | ORAL_TABLET | Freq: Four times a day (QID) | ORAL | Status: DC | PRN

## 2013-07-01 MED ORDER — MIDAZOLAM HCL 2 MG/2ML IJ SOLN
2.0000 mg | Freq: Once | INTRAMUSCULAR | Status: AC
  Administered 2013-07-01: 2 mg via INTRAVENOUS

## 2013-07-01 MED ORDER — EPHEDRINE SULFATE 50 MG/ML IJ SOLN
INTRAMUSCULAR | Status: DC | PRN
  Administered 2013-07-01 (×2): 10 mg via INTRAVENOUS

## 2013-07-01 MED ORDER — EPHEDRINE SULFATE 50 MG/ML IJ SOLN
INTRAMUSCULAR | Status: AC
Start: 2013-07-01 — End: 2013-07-01
  Filled 2013-07-01: qty 1

## 2013-07-01 MED ORDER — MIDAZOLAM HCL 5 MG/5ML IJ SOLN
INTRAMUSCULAR | Status: DC | PRN
  Administered 2013-07-01: 2 mg via INTRAVENOUS

## 2013-07-01 MED ORDER — SODIUM CHLORIDE 0.9 % IJ SOLN
INTRAMUSCULAR | Status: DC | PRN
  Administered 2013-07-01: 20 mL via INTRAVENOUS

## 2013-07-01 MED ORDER — GLYCOPYRROLATE 0.2 MG/ML IJ SOLN
INTRAMUSCULAR | Status: DC | PRN
  Administered 2013-07-01: 0.2 mg via INTRAVENOUS
  Administered 2013-07-01: 0.4 mg via INTRAVENOUS

## 2013-07-01 MED ORDER — GLYCOPYRROLATE 0.2 MG/ML IJ SOLN
INTRAMUSCULAR | Status: AC
  Filled 2013-07-01: qty 1

## 2013-07-01 MED ORDER — BUPIVACAINE LIPOSOME 1.3 % IJ SUSP
20.0000 mL | Freq: Once | INTRAMUSCULAR | Status: AC
  Administered 2013-07-01: 17 mL
  Filled 2013-07-01: qty 20

## 2013-07-01 MED ORDER — MIDAZOLAM HCL 2 MG/2ML IJ SOLN
INTRAMUSCULAR | Status: AC
  Filled 2013-07-01: qty 2

## 2013-07-01 MED ORDER — FENTANYL CITRATE 0.05 MG/ML IJ SOLN
INTRAMUSCULAR | Status: AC
  Filled 2013-07-01: qty 5

## 2013-07-01 MED ORDER — FENTANYL CITRATE 0.05 MG/ML IJ SOLN
INTRAMUSCULAR | Status: DC | PRN
  Administered 2013-07-01: 50 ug via INTRAVENOUS
  Administered 2013-07-01: 150 ug via INTRAVENOUS
  Administered 2013-07-01: 100 ug via INTRAVENOUS
  Administered 2013-07-01: 50 ug via INTRAVENOUS

## 2013-07-01 MED ORDER — PROPOFOL 10 MG/ML IV BOLUS
INTRAVENOUS | Status: AC
  Filled 2013-07-01: qty 20

## 2013-07-01 MED ORDER — LIDOCAINE HCL (CARDIAC) 20 MG/ML IV SOLN
INTRAVENOUS | Status: DC | PRN
  Administered 2013-07-01: 20 mg via INTRAVENOUS

## 2013-07-01 MED ORDER — FENTANYL CITRATE 0.05 MG/ML IJ SOLN
100.0000 ug | Freq: Once | INTRAMUSCULAR | Status: AC
  Administered 2013-07-01: 100 ug via INTRAVENOUS

## 2013-07-01 MED ORDER — FENTANYL CITRATE 0.05 MG/ML IJ SOLN
INTRAMUSCULAR | Status: AC
  Administered 2013-07-01: 100 ug via INTRAVENOUS
  Filled 2013-07-01: qty 2

## 2013-07-01 MED ORDER — BUPIVACAINE-EPINEPHRINE (PF) 0.5% -1:200000 IJ SOLN
INTRAMUSCULAR | Status: DC | PRN
  Administered 2013-07-01: 30 mL

## 2013-07-01 MED ORDER — PROPOFOL 10 MG/ML IV BOLUS
INTRAVENOUS | Status: DC | PRN
  Administered 2013-07-01: 120 mg via INTRAVENOUS

## 2013-07-01 MED ORDER — ROCURONIUM BROMIDE 100 MG/10ML IV SOLN
INTRAVENOUS | Status: DC | PRN
  Administered 2013-07-01: 50 mg via INTRAVENOUS

## 2013-07-01 MED ORDER — OXYCODONE HCL 5 MG PO TABS
ORAL_TABLET | ORAL | Status: AC
  Filled 2013-07-01: qty 1

## 2013-07-01 MED ORDER — MEPERIDINE HCL 25 MG/ML IJ SOLN: 6.2500 mg | INTRAMUSCULAR | Status: DC | PRN

## 2013-07-01 MED ORDER — OXYCODONE HCL 5 MG PO TABS
5.0000 mg | ORAL_TABLET | Freq: Once | ORAL | Status: AC | PRN
  Administered 2013-07-01: 5 mg via ORAL

## 2013-07-01 MED ORDER — MIDAZOLAM HCL 2 MG/2ML IJ SOLN
0.5000 mg | Freq: Once | INTRAMUSCULAR | Status: DC | PRN
Start: 2013-07-01 — End: 2013-07-01

## 2013-07-01 MED ORDER — KETOROLAC TROMETHAMINE 30 MG/ML IJ SOLN
INTRAMUSCULAR | Status: AC
  Filled 2013-07-01: qty 1

## 2013-07-01 MED ORDER — PROMETHAZINE HCL 25 MG/ML IJ SOLN: 6.2500 mg | INTRAMUSCULAR | Status: DC | PRN

## 2013-07-01 MED ORDER — OXYCODONE HCL 5 MG/5ML PO SOLN: 5.0000 mg | Freq: Once | ORAL | Status: AC | PRN

## 2013-07-01 MED ORDER — ONDANSETRON HCL 4 MG/2ML IJ SOLN
INTRAMUSCULAR | Status: DC | PRN
  Administered 2013-07-01: 4 mg via INTRAVENOUS

## 2013-07-01 MED ORDER — KETOROLAC TROMETHAMINE 30 MG/ML IJ SOLN
INTRAMUSCULAR | Status: DC | PRN
  Administered 2013-07-01: 30 mg via INTRAVENOUS

## 2013-07-01 SURGICAL SUPPLY — 56 items
BANDAGE ELASTIC 3 VELCRO ST LF (GAUZE/BANDAGES/DRESSINGS) ×2 IMPLANT
BANDAGE ELASTIC 4 VELCRO ST LF (GAUZE/BANDAGES/DRESSINGS) ×1 IMPLANT
BANDAGE GAUZE ELAST BULKY 4 IN (GAUZE/BANDAGES/DRESSINGS) ×1 IMPLANT
BLADE 10 SAFETY STRL DISP (BLADE) ×2 IMPLANT
BNDG CMPR 9X4 STRL LF SNTH (GAUZE/BANDAGES/DRESSINGS) ×1
BNDG ESMARK 4X9 LF (GAUZE/BANDAGES/DRESSINGS) ×2 IMPLANT
CARTRIDGE CURVETEK MED (MISCELLANEOUS) ×1 IMPLANT
CORDS BIPOLAR (ELECTRODE) ×2 IMPLANT
COVER SURGICAL LIGHT HANDLE (MISCELLANEOUS) ×2 IMPLANT
CUFF TOURNIQUET SINGLE 18IN (TOURNIQUET CUFF) ×2 IMPLANT
CUFF TOURNIQUET SINGLE 24IN (TOURNIQUET CUFF) IMPLANT
DECANTER SPIKE VIAL GLASS SM (MISCELLANEOUS) IMPLANT
DRAPE SURG 17X23 STRL (DRAPES) ×1 IMPLANT
DRAPE U-SHAPE 47X51 STRL (DRAPES) ×1 IMPLANT
DURAPREP 26ML APPLICATOR (WOUND CARE) ×2 IMPLANT
GAUZE XEROFORM 1X8 LF (GAUZE/BANDAGES/DRESSINGS) ×1 IMPLANT
GLOVE BIOGEL PI IND STRL 6.5 (GLOVE) IMPLANT
GLOVE BIOGEL PI IND STRL 8 (GLOVE) ×1 IMPLANT
GLOVE BIOGEL PI INDICATOR 6.5 (GLOVE) ×1
GLOVE BIOGEL PI INDICATOR 8 (GLOVE) ×1
GLOVE SURG ORTHO 8.0 STRL STRW (GLOVE) ×2 IMPLANT
GOWN STRL REUS W/ TWL LRG LVL3 (GOWN DISPOSABLE) ×3 IMPLANT
GOWN STRL REUS W/TWL LRG LVL3 (GOWN DISPOSABLE) ×6
KIT BASIN OR (CUSTOM PROCEDURE TRAY) ×2 IMPLANT
KIT ROOM TURNOVER OR (KITS) ×2 IMPLANT
LOOP VESSEL MINI RED (MISCELLANEOUS) ×2 IMPLANT
MANIFOLD NEPTUNE II (INSTRUMENTS) ×2 IMPLANT
NEEDLE 27GAX1X1/2 (NEEDLE) IMPLANT
NS IRRIG 1000ML POUR BTL (IV SOLUTION) ×3 IMPLANT
PACK ORTHO EXTREMITY (CUSTOM PROCEDURE TRAY) ×2 IMPLANT
PAD ABD 8X10 STRL (GAUZE/BANDAGES/DRESSINGS) ×1 IMPLANT
PAD ARMBOARD 7.5X6 YLW CONV (MISCELLANEOUS) ×4 IMPLANT
PAD CAST 3X4 CTTN HI CHSV (CAST SUPPLIES) IMPLANT
PADDING CAST ABS 4INX4YD NS (CAST SUPPLIES) ×1
PADDING CAST ABS COTTON 4X4 ST (CAST SUPPLIES) IMPLANT
PADDING CAST COTTON 3X4 STRL (CAST SUPPLIES) ×2
SPONGE GAUZE 4X4 12PLY (GAUZE/BANDAGES/DRESSINGS) ×1 IMPLANT
STRIP CLOSURE SKIN 1/2X4 (GAUZE/BANDAGES/DRESSINGS) ×1 IMPLANT
SUCTION FRAZIER TIP 10 FR DISP (SUCTIONS) IMPLANT
SUT 2 FIBERLOOP 20 STRT BLUE (SUTURE) ×2
SUT ETHILON 3 0 PS 1 (SUTURE) ×4 IMPLANT
SUT ETHILON 5 0 PS 2 18 (SUTURE) IMPLANT
SUT PROLENE 3 0 PS 2 (SUTURE) ×2 IMPLANT
SUT VIC AB 0 CT2 27 (SUTURE) ×2 IMPLANT
SUT VIC AB 1 CT1 27 (SUTURE) ×6
SUT VIC AB 1 CT1 27XBRD ANBCTR (SUTURE) ×3 IMPLANT
SUT VIC AB 2-0 CT1 27 (SUTURE) ×8
SUT VIC AB 2-0 CT1 TAPERPNT 27 (SUTURE) IMPLANT
SUT VIC AB 3-0 FS2 27 (SUTURE) IMPLANT
SUTURE 2 FIBERLOOP 20 STRT BLU (SUTURE) IMPLANT
SYR CONTROL 10ML LL (SYRINGE) IMPLANT
TOWEL OR 17X24 6PK STRL BLUE (TOWEL DISPOSABLE) ×2 IMPLANT
TOWEL OR 17X26 10 PK STRL BLUE (TOWEL DISPOSABLE) ×2 IMPLANT
TUBE CONNECTING 12X1/4 (SUCTIONS) IMPLANT
UNDERPAD 30X30 INCONTINENT (UNDERPADS AND DIAPERS) ×2 IMPLANT
WATER STERILE IRR 1000ML POUR (IV SOLUTION) ×2 IMPLANT

## 2013-07-01 NOTE — Anesthesia Postprocedure Evaluation (Signed)
  Anesthesia Post-op Note  Patient: [name]  Procedure(s) Performed: Procedure(s): DISTAL BICEPS TENDON REPAIR (Left)  Patient Location: PACU  Anesthesia Type:GA combined with regional for post-op pain  Level of Consciousness: awake, alert , oriented and patient cooperative  Airway and Oxygen Therapy: Patient Spontanous Breathing  Post-op Pain: mild  Post-op Assessment: Post-op Vital signs reviewed, Patient's Cardiovascular Status Stable, Respiratory Function Stable, Patent Airway, No signs of Nausea or vomiting and Pain level controlled  Post-op Vital Signs: Reviewed and stable  Last Vitals:  Filed Vitals:   07/01/13 1300  BP: 170/79  Pulse: 62  Temp:   Resp: 10    Complications: No apparent anesthesia complications

## 2013-07-01 NOTE — Progress Notes (Signed)
Patients heart rate was 50 therefore Ziac will be held this am.

## 2013-07-01 NOTE — Anesthesia Procedure Notes (Signed)
Anesthesia Regional Block:  Supraclavicular block  Pre-Anesthetic Checklist: ,, timeout performed, Correct Patient, Correct Site, Correct Laterality, Correct Procedure, Correct Position, site marked, Risks and benefits discussed,  Surgical consent,  Pre-op evaluation,  At surgeon's request and post-op pain management  Laterality: Left and Upper  Prep: chloraprep       Needles:   Needle Type: Echogenic Stimulator Needle      Needle Gauge: 22 and 22 G    Additional Needles:  Procedures: ultrasound guided (picture in chart) and nerve stimulator Supraclavicular block  Nerve Stimulator or Paresthesia:  Response: forearm twitch, 0.4 mA, 0.1 ms,   Additional Responses:   Narrative:  Start time: 07/01/2013 9:26 AM End time: 07/01/2013 9:34 AM Injection made incrementally with aspirations every 5 mL.  Performed by: Personally  Anesthesiologist: Jenita Seashore, MD  Additional Notes: Pt identified in Holding room.  Monitors applied. Working IV access confirmed. Sterile prep L clavicle and neck.  #22ga PNS to forearm twitch at 0.37mA threshold with US guidance (image in chart).  30cc 0.5% Bupivacaine with 1:200k epi injected incrementally after negative test dose.  Patient asymptomatic, VSS, no heme aspirated, tolerated well.  Jenita Seashore, MD

## 2013-07-01 NOTE — Transfer of Care (Signed)
Immediate Anesthesia Transfer of Care Note  Patient: [name]  Procedure(s) Performed: Procedure(s): DISTAL BICEPS TENDON REPAIR (Left)  Patient Location: PACU  Anesthesia Type:General  Level of Consciousness: awake and alert   Airway & Oxygen Therapy: Patient Spontanous Breathing and Patient connected to nasal cannula oxygen  Post-op Assessment: Report given to PACU RN and Post -op Vital signs reviewed and stable  Post vital signs: Reviewed and stable  Complications: No apparent anesthesia complications

## 2013-07-01 NOTE — Anesthesia Preprocedure Evaluation (Addendum)
Anesthesia Evaluation  Patient identified by MRN, date of birth, ID band Patient awake    Reviewed: Allergy & Precautions, H&P , NPO status , Patient's Chart, lab work & pertinent test results, reviewed documented beta blocker date and time   History of Anesthesia Complications (+) history of anesthetic complications  Airway Mallampati: I TM Distance: >3 FB Neck ROM: Full    Dental  (+) Missing, Caps, Implants, Dental Advisory Given   Pulmonary sleep apnea and Continuous Positive Airway Pressure Ventilation , former smoker (quit 20 years),  breath sounds clear to auscultation  Pulmonary exam normal       Cardiovascular hypertension, Pt. on medications and Pt. on home beta blockers - anginaRhythm:Regular Rate:Normal     Neuro/Psych Anxiety negative neurological ROS     GI/Hepatic negative GI ROS, Neg liver ROS,   Endo/Other  Morbid obesity  Renal/GU negative Renal ROS     Musculoskeletal  (+) Arthritis - (chronic knee pain s/p TKR: narcotics), Osteoarthritis,    Abdominal (+) + obese,   Peds  Hematology   Anesthesia Other Findings   Reproductive/Obstetrics                          Anesthesia Physical Anesthesia Plan  ASA: III  Anesthesia Plan: General   Post-op Pain Management:    Induction: Intravenous  Airway Management Planned: Oral ETT  Additional Equipment:   Intra-op Plan:   Post-operative Plan: Extubation in OR  Informed Consent: I have reviewed the patients History and Physical, chart, labs and discussed the procedure including the risks, benefits and alternatives for the proposed anesthesia with the patient or authorized representative who has indicated his/her understanding and acceptance.   Dental advisory given  Plan Discussed with: CRNA and Surgeon  Anesthesia Plan Comments: (Plan routine monitors, GETA with supraclavicular block for post op analgesia)         Anesthesia Quick Evaluation

## 2013-07-01 NOTE — Brief Op Note (Signed)
07/01/2013  12:06 PM  PATIENT:  [name]  53 y.o. male  PRE-OPERATIVE DIAGNOSIS:  LEFT ELBOW DISTAL BICEPS RUPTURE  POST-OPERATIVE DIAGNOSIS:  * left distal biceps rupture  PROCEDURE:  Procedure(s): DISTAL BICEPS TENDON REPAIR  SURGEON:  Surgeon(s): Meredith Pel, MD  ASSISTANT: s vernon pa  ANESTHESIA:   general  EBL: 15 ml       BLOOD ADMINISTERED: none  DRAINS: none   LOCAL MEDICATIONS USED:  none  SPECIMEN:  No Specimen  COUNTS:  YES  TOURNIQUET:   Total Tourniquet Time Documented: Upper Arm (Left) - 44 minutes Total: Upper Arm (Left) - 44 minutes   DICTATION: .Other Dictation: Dictation Number [id number]  PLAN OF CARE: Discharge to home after PACU  PATIENT DISPOSITION:  PACU - hemodynamically stable

## 2013-07-01 NOTE — H&P (Signed)
[name] is an 53 y.o. male.   Chief Complaint: Left elbow pain HPI: [name] is a 53 year old patient with left elbow pain he injured his left elbow 3 weeks year. Reports weakness to supination. MRI scan consistent with distal biceps rupture he injured it playing and performing tae kwon do  Past Medical History  Diagnosis Date  . Diverticulitis   . Chronic pain of left knee   . Hyperlipidemia   . Labile hypertension   . Anxiety   . Prediabetes   . IBS (irritable bowel syndrome)   . Other testicular hypofunction   . Allergy   . Vitamin D deficiency   . Sleep apnea     uses CPAP  . Kidney stones   . Arthritis     Past Surgical History  Procedure Laterality Date  . Appendectomy    . Tonsillectomy    . Foot fusion Right     x2  . Forearm surgery Left     x3;from [place] wound  . Replacement total knee Left     x2  . Umbilical hernia repair    . Knee arthroscopy Left     "several times"  . Breast cyst excision  53 yrs old  . Elbow arthroscopy with tendon reconstruction Right   . Kidney stone surgery      Family History  Problem Relation Age of Onset  . Colon cancer Neg Hx   . Heart disease Mother   . COPD Father   . Ulcerative colitis Brother    Social History:  reports that he quit smoking about 19 years ago. He has quit using smokeless tobacco. His smokeless tobacco use included Snuff. He reports that he drinks alcohol. He reports that he does not use illicit drugs.  Allergies:  Allergies  Allergen Reactions  . Penicillins Anaphylaxis  . Ace Inhibitors Other (See Comments)    unknown  . Keflex [Cephalexin] Hives    Medications Prior to Admission  Medication Sig Dispense Refill  . atorvastatin (LIPITOR) 80 MG tablet Take 40 mg by mouth every 3 (three) days.       . bisoprolol-hydrochlorothiazide (ZIAC) 5-6.25 MG per tablet Take 1 tablet by mouth daily.      . clonazePAM (KLONOPIN) 1 MG tablet Take 1 mg by mouth daily as needed for anxiety.      . docusate  sodium (COLACE) 100 MG capsule Take 100 mg by mouth daily.      Marland Kitchen HYDROmorphone (DILAUDID) 4 MG tablet Take 4-8 mg by mouth every 4 (four) hours as needed (pain).       Marland Kitchen HYDROmorphone HCl (EXALGO) 8 MG T24A SR tablet Take 8-16 mg by mouth every 12 (twelve) hours as needed (pain).      Marland Kitchen VITAMIN D, CHOLECALCIFEROL, PO Take 10,000 mg by mouth daily.      . VOLTAREN 1 % GEL Apply 2 g topically Daily.       Marland Kitchen testosterone cypionate (DEPOTESTOTERONE CYPIONATE) 200 MG/ML injection Inject 200 mg into the muscle Once every three weeks.         Results for orders placed during the hospital encounter of 06/30/13 (from the past 48 hour(s))  BASIC METABOLIC PANEL     Status: None   Collection Time    06/30/13  1:57 PM      Result Value Ref Range   Sodium 138  137 - 147 mEq/L   Potassium 4.5  3.7 - 5.3 mEq/L   Chloride 96  96 -  112 mEq/L   CO2 31  19 - 32 mEq/L   Glucose, Bld 90  70 - 99 mg/dL   BUN 15  6 - 23 mg/dL   Creatinine, Ser 0.99  0.50 - 1.35 mg/dL   Calcium 9.8  8.4 - 10.5 mg/dL   GFR calc non Af Amer >90  >90 mL/min   GFR calc Af Amer >90  >90 mL/min   Comment: (NOTE)     The eGFR has been calculated using the CKD EPI equation.     This calculation has not been validated in all clinical situations.     eGFR's persistently <90 mL/min signify possible Chronic Kidney     Disease.  CBC     Status: None   Collection Time    06/30/13  1:57 PM      Result Value Ref Range   WBC 7.7  4.0 - 10.5 K/uL   RBC 5.33  4.22 - 5.81 MIL/uL   Hemoglobin 17.0  13.0 - 17.0 g/dL   HCT 49.8  39.0 - 52.0 %   MCV 93.4  78.0 - 100.0 fL   MCH 31.9  26.0 - 34.0 pg   MCHC 34.1  30.0 - 36.0 g/dL   RDW 12.5  11.5 - 15.5 %   Platelets 190  150 - 400 K/uL   Dg Chest 2 View  06/30/2013   CLINICAL DATA:  Preoperative evaluation; sleep apnea and hypertension  EXAM: CHEST  2 VIEW  COMPARISON:  August 07, 2004  FINDINGS: There is no edema or consolidation. Heart is upper normal in size with normal pulmonary  vascularity. No adenopathy. No bone lesions.  IMPRESSION: No edema or consolidation.   Electronically Signed   By: Lowella Grip M.D.   On: 06/30/2013 14:35    Review of Systems  Constitutional: Negative.   HENT: Negative.   Eyes: Negative.   Respiratory: Negative.   Cardiovascular: Negative.   Gastrointestinal: Negative.   Genitourinary: Negative.   Musculoskeletal: Positive for joint pain.  Skin: Negative.   Neurological: Negative.   Endo/Heme/Allergies: Negative.   Psychiatric/Behavioral: Negative.     Blood pressure 142/72, pulse 50, temperature 98 F (36.7 C), temperature source Oral, resp. rate 18, weight 96.616 kg (213 lb), SpO2 98.00%. Physical Exam  Constitutional: He appears well-developed.  HENT:  Head: Normocephalic.  Eyes: Pupils are equal, round, and reactive to light.  Neck: Normal range of motion.  Cardiovascular: Normal rate.   Respiratory: Effort normal.  Neurological: He is alert.  Skin: Skin is warm.  Psychiatric: He has a normal mood and affect.   examination the left arm demonstrates tenderness in the antecubital fossa palpable radial pulse intact EPL FPL interosseous range of motion full supination strength is weak distal biceps deformity present  Assessment/Plan Impression left distal biceps rupture 94 weeks old plan distal biceps rupture repair using 2 incision technique risk benefits discussed with the patient due to limited to infection, stiffness nerve vessel damage synostosis potential for prolonged recovery all questions answered pain management may also be an issue because he is on Dilaudid chronically. We'll plan to use oxycodone a supplement along with interscalene block  Meredith Pel 07/01/2013, 9:23 AM

## 2013-07-02 NOTE — Op Note (Signed)
NAMEBRELAN, HANNEN NO.:  [id number]  MEDICAL RECORD NO.:  [id number]  LOCATION:  [facility]                         FACILITY:  [facility]  PHYSICIAN:  Anderson Malta, M.D.    DATE OF BIRTH:  14-Nov-1960  DATE OF PROCEDURE:  07/01/2013 DATE OF DISCHARGE:  07/01/2013                              OPERATIVE REPORT   PREOPERATIVE DIAGNOSIS:  Left distal biceps rupture.  POSTOPERATIVE DIAGNOSIS:  Left distal biceps rupture.  PROCEDURE:  Left distal biceps rupture repair.  SURGEON:  Anderson Malta, M.D.  ASSISTANT:  Epimenio Foot, PA-C.  ANESTHESIA:  General.  INDICATIONS:  [name] is a patient, who is about 3 weeks out left distal biceps tendon rupture, presents now for operative management after explanation of risks and benefits.  TOURNIQUET TIME:  44 minutes at 250 mmHg.  PROCEDURE IN DETAIL:  The patient was brought to the operating room where general endotracheal anesthesia was induced.  Preoperative antibiotics were administered.  Time-out was called.  Left arm was prescrubbed with alcohol and Betadine which which was allowed to air dry, prepped with DuraPrep solution, draped in sterile manner.  [name] was used to cover the operative field.  Sterile tourniquet was utilized. Incision was made in the antecubital fossa.  Skin and subcutaneous tissues were sharply divided.  Biceps tendon was torn and retracted about 2 cm scarred in to some degree within the tunnel.  This was freed up.  Lateral antebrachial cutaneous nerve and terminal branches of the muscular cutaneous nerve was identified and protected.  At this time, with the tendon freed, definitely it was fully detached from the radial tuberosity and retracted about 3 cm.  At this time, fiber loop suture was placed through the tendon.  The tendon edge was freshened up. Tapered adhesions.  At this time, a curved Kelly clamp was then placed carefully on the ulnar side of the radial tuberosity and taken  through the skin to the dorsal forearm region between the ulna and the radius. The ulna was at no time visualized, exposed or instrumented.  An incision was then made over the extensor tendon.  Skin and subcutaneous tissues were sharply divided.  Muscle and fascia was split bluntly.  A vessel loop was then placed and transported from dorsal to the antecubital fossa.  The radial tuberosity was then prepared.  A trough was prepared.  Thorough irrigation was performed during this preparation of the trough with osteotomes and curettes only.  Excess bone was removed.  Curved tech drill was then used to drill 2 tunnels into the tuberosity.  Biceps tendon was then passed from the antecubital fossa in its native tunnel into the created trough within the radial tuberosity and tied.  This was done with the arm in pronation.  At this time, good secure and repair was achieved.  Tourniquet was released after 43 minutes.  Thorough irrigation again performed.  The patient was taken through range of motion, had excellent range of motion.  Biceps tendon was tight because of the duration from the injury.  However, it was well reduced into the trough.  At this time, thorough irrigation performed in both incisions.  The  antecubital fossa incision closed using 3-0 Vicryl and 3-0 nylon.  The dorsal lateral incision closed using interrupted and inverted 0 Vicryl suture, 2-0 Vicryl suture and 3-0 Prolene.  An Exparel utilized for postop pain control and this patient has not had any history of heavy narcotic use for chronic pain.  Bulky dressing and posterior splint applied.  The patient transferred to the recovery room in stable condition.  Plan of discharge today splint plus range of motion next week early.  Freda Munro Vernon's assistance required at all times during the case.  Her assistance was a medical necessity for retraction, mobilization, opening and closing.     Anderson Malta, M.D.     GSD/MEDQ  D:   07/01/2013  T:  07/02/2013  Job:  [id number]

## 2013-07-05 ENCOUNTER — Ambulatory Visit: Payer: BC Managed Care – PPO | Admitting: Pulmonary Disease

## 2013-07-05 ENCOUNTER — Encounter (HOSPITAL_COMMUNITY): Payer: Self-pay | Admitting: Orthopedic Surgery

## 2013-07-28 ENCOUNTER — Other Ambulatory Visit: Payer: Self-pay | Admitting: Physician Assistant

## 2013-07-28 ENCOUNTER — Other Ambulatory Visit: Payer: Self-pay | Admitting: Internal Medicine

## 2013-09-15 ENCOUNTER — Encounter: Payer: Self-pay | Admitting: Internal Medicine

## 2013-09-15 NOTE — Patient Instructions (Signed)
   Recommend the book "The END of DIETING" by Dr Raevon Furman   and the book "The END of DIABETES " by Dr Jesiah Fuhrman  At [employer] - get book & Audio CD's      Being diabetic has a  300% increased risk for heart attack, stroke, cancer, and alzheimer- type vascular dementia. It is very important that you work harder with diet by avoiding all foods that are white except chicken & fish. Avoid white rice (brown & wild rice is OK), white potatoes (sweetpotatoes in moderation is OK), White bread or wheat bread or anything made out of white flour like bagels, donuts, rolls, buns, biscuits, cakes, pastries, cookies, pizza crust, and pasta (made from white flour & egg whites) - vegetarian pasta or spinach or wheat pasta is OK. Multigrain breads like Arnold's or Pepperidge Farm, or multigrain sandwich thins or flatbreads.  Diet, exercise and weight loss can reverse and cure diabetes in the early stages.  Diet, exercise and weight loss is very important in the control and prevention of complications of diabetes which affects every system in your body, ie. Brain - dementia/stroke, eyes - glaucoma/blindness, heart - heart attack/heart failure, kidneys - dialysis, stomach - gastric paralysis, intestines - malabsorption, nerves - severe painful neuritis, circulation - gangrene & loss of a leg(s), and finally cancer and Alzheimers.    I recommend avoid fried & greasy foods,  sweets/candy, white rice (brown or wild rice or Quinoa is OK), white potatoes (sweet potatoes are OK) - anything made from white flour - bagels, doughnuts, rolls, buns, biscuits,white and wheat breads, pizza crust and traditional pasta made of white flour & egg white(vegetarian pasta or spinach or wheat pasta is OK).  Multi-grain bread is OK - like multi-grain flat bread or sandwich thins. Avoid alcohol in excess. Exercise is also important.    Eat all the vegetables you want - avoid meat, especially red meat and dairy - especially cheese.  Cheese  is the most concentrated form of trans-fats which is the worst thing to clog up our arteries. Veggie cheese is OK which can be found in the fresh produce section at [employer] or [employer] or [employer]   

## 2013-09-15 NOTE — Progress Notes (Signed)
Patient ID: [name], male   DOB: 26-Feb-1961, 53 y.o.   MRN: [id number]   This very nice 53 y.o.MWM presents for 3 month follow up with Hypertension, Hyperlipidemia, Pre-Diabetes and Vitamin D Deficiency.  In May , he had repair of a distal Lt Biceps insertion and in June had Right Knee Surgery.   HTN predates since 2009. BP has been controlled at home. Today's BP: 128/80 mmHg. Patient denies any cardiac type chest pain, palpitations, dyspnea/orthopnea/PND, dizziness, claudication, or dependent edema.   Hyperlipidemia is controlled with diet & meds. Patient denies myalgias or other med SE's. Last Lipids were at goal in April 2015.  Lab Results  Component Value Date   CHOL 167 09/16/2013   HDL 47 09/16/2013   LDLCALC 102* 09/16/2013   TRIG 89 09/16/2013   CHOLHDL 3.6 09/16/2013    Also, the patient has history of PreDiabetes since  11/2008 and last A1c was 5.7% in Apr 2015.  Patient denies any symptoms of reactive hypoglycemia, diabetic polys, paresthesias or visual blurring.   Further, Patient has history of Vitamin D Deficiency of 26 in 2009 and last vitamin D was 22 in Apr 2015. Patient supplements vitamin D without any suspected side-effects.   Medication List   atorvastatin 80 MG tablet  Commonly known as:  LIPITOR  Take 40 mg by mouth every 3 (three) days.     bisoprolol-hydrochlorothiazide 5-6.25 MG per tablet  Commonly known as:  ZIAC  Take 1 tablet by mouth daily.     clonazePAM 1 MG tablet  Commonly known as:  KLONOPIN  take 1/2 to 1 tablet by mouth three times a day if needed for anxiety     docusate sodium 100 MG capsule  Commonly known as:  COLACE  Take 100 mg by mouth daily.     HYDROmorphone HCl 8 MG T24a SR tablet  Commonly known as:  EXALGO  Take 8-16 mg by mouth every 12 (twelve) hours as needed (pain).     oxyCODONE-acetaminophen 10-325 MG per tablet  Commonly known as:  PERCOCET  Take 1 tablet by mouth every 6 (six) hours as needed for pain.     testosterone cypionate 200 MG/ML injection  Commonly known as:  DEPOTESTOTERONE CYPIONATE  inject 2 milliliters intramuscularly every 2 weeks     VITAMIN D (CHOLECALCIFEROL) PO  Take 10,000 mg by mouth daily.     VOLTAREN 1 % Gel  Generic drug:  diclofenac sodium  Apply 2 g topically Daily.        Allergies  Allergen Reactions  . Penicillins Anaphylaxis  . Ace Inhibitors Other (See Comments)    unknown  . Keflex [Cephalexin] Hives   PMHx:   Past Medical History  Diagnosis Date  . Diverticulitis   . Chronic pain of left knee   . Hyperlipidemia   . Labile hypertension   . Anxiety   . Prediabetes   . IBS (irritable bowel syndrome)   . Other testicular hypofunction   . Allergy   . Vitamin D deficiency   . Sleep apnea     uses CPAP  . Kidney stones   . Arthritis    FHx:    Reviewed / unchanged  SHx:    Reviewed / unchanged  Systems Review:  Constitutional: Denies fever, chills, wt changes, headaches, insomnia, fatigue, night sweats, change in appetite. Eyes: Denies redness, blurred vision, diplopia, discharge, itchy, watery eyes.  ENT: Denies discharge, congestion, post nasal drip, epistaxis, sore throat, earache, hearing loss, dental  pain, tinnitus, vertigo, sinus pain, snoring.  CV: Denies chest pain, palpitations, irregular heartbeat, syncope, dyspnea, diaphoresis, orthopnea, PND, claudication or edema. Respiratory: denies cough, dyspnea, DOE, pleurisy, hoarseness, laryngitis, wheezing.  Gastrointestinal: Denies dysphagia, odynophagia, heartburn, reflux, water brash, abdominal pain or cramps, nausea, vomiting, bloating, diarrhea, constipation, hematemesis, melena, hematochezia  or hemorrhoids. Genitourinary: Denies dysuria, frequency, urgency, nocturia, hesitancy, discharge, hematuria or flank pain. Musculoskeletal: Denies arthralgias, myalgias, stiffness, jt. swelling, pain, limping or strain/sprain.  Skin: Denies pruritus, rash, hives, warts, acne, eczema or change  in skin lesion(s). Neuro: No weakness, tremor, incoordination, spasms, paresthesia or pain. Psychiatric: Denies confusion, memory loss or sensory loss. Endo: Denies change in weight, skin or hair change.  Heme/Lymph: No excessive bleeding, bruising or enlarged lymph nodes.  Exam:  BP 128/80  Pulse 52  Temp 98.1 F   Resp 16  Ht 5\' 5"    Wt 221 lb 9.6 oz   BMI 36.88 kg/m2   Appears well nourished and in no distress. Eyes: PERRLA, EOMs, conjunctiva no swelling or erythema. Sinuses: No frontal/maxillary tenderness ENT/Mouth: EAC's clear, TM's nl w/o erythema, bulging. Nares clear w/o erythema, swelling, exudates. Oropharynx clear without erythema or exudates. Oral hygiene is good. Tongue normal, non obstructing. Hearing intact.  Neck: Supple. Thyroid nl. Car 2+/2+ without bruits, nodes or JVD. Chest: Respirations nl with BS clear & equal w/o rales, rhonchi, wheezing or stridor.  Cor: Heart sounds normal w/ regular rate and rhythm without sig. murmurs, gallops, clicks, or rubs. Peripheral pulses normal and equal  without edema.  Abdomen: Soft & bowel sounds normal. Non-tender w/o guarding, rebound, hernias, masses, or organomegaly.  Lymphatics: Unremarkable.  Musculoskeletal: Full ROM all peripheral extremities, joint stability, 5/5 strength, and normal gait.  Skin: Warm, dry without exposed rashes, lesions or ecchymosis apparent.  Neuro: Cranial nerves intact, reflexes equal bilaterally. Sensory-motor testing grossly intact. Tendon reflexes grossly intact.  Pysch: Alert & oriented x 3. Insight and judgement nl & appropriate. No ideations.  Assessment and Plan:  1. Hypertension - Continue monitor blood pressure at home. Continue diet/meds same.  2. Hyperlipidemia - Continue diet/meds, exercise,& lifestyle modifications. Continue monitor periodic cholesterol/liver & renal functions   3. Pre-Diabetes - Continue diet, exercise, lifestyle modifications. Monitor appropriate labs.  4.  Vitamin D Deficiency - Continue supplementation.  Recommended regular exercise, BP monitoring, weight control, and discussed med and SE's. Recommended labs to assess and monitor clinical status. Further disposition pending results of labs.

## 2013-09-16 ENCOUNTER — Encounter: Payer: Self-pay | Admitting: Internal Medicine

## 2013-09-16 ENCOUNTER — Ambulatory Visit (INDEPENDENT_AMBULATORY_CARE_PROVIDER_SITE_OTHER): Payer: BC Managed Care – PPO | Admitting: Internal Medicine

## 2013-09-16 VITALS — BP 128/80 | HR 52 | Temp 98.1°F | Resp 16 | Ht 65.0 in | Wt 221.6 lb

## 2013-09-16 DIAGNOSIS — R7309 Other abnormal glucose: Secondary | ICD-10-CM

## 2013-09-16 DIAGNOSIS — Z79899 Other long term (current) drug therapy: Secondary | ICD-10-CM

## 2013-09-16 DIAGNOSIS — E559 Vitamin D deficiency, unspecified: Secondary | ICD-10-CM

## 2013-09-16 DIAGNOSIS — R7303 Prediabetes: Secondary | ICD-10-CM

## 2013-09-16 DIAGNOSIS — E785 Hyperlipidemia, unspecified: Secondary | ICD-10-CM

## 2013-09-16 DIAGNOSIS — I10 Essential (primary) hypertension: Secondary | ICD-10-CM

## 2013-09-16 DIAGNOSIS — R0989 Other specified symptoms and signs involving the circulatory and respiratory systems: Secondary | ICD-10-CM

## 2013-09-16 LAB — CBC WITH DIFFERENTIAL/PLATELET
BASOS PCT: 2 % — AB (ref 0–1)
Basophils Absolute: 0.1 10*3/uL (ref 0.0–0.1)
EOS ABS: 0.2 10*3/uL (ref 0.0–0.7)
Eosinophils Relative: 3 % (ref 0–5)
HCT: 48 % (ref 39.0–52.0)
HEMOGLOBIN: 16.6 g/dL (ref 13.0–17.0)
Lymphocytes Relative: 27 % (ref 12–46)
Lymphs Abs: 1.6 10*3/uL (ref 0.7–4.0)
MCH: 31.1 pg (ref 26.0–34.0)
MCHC: 34.6 g/dL (ref 30.0–36.0)
MCV: 90.1 fL (ref 78.0–100.0)
Monocytes Absolute: 0.9 10*3/uL (ref 0.1–1.0)
Monocytes Relative: 15 % — ABNORMAL HIGH (ref 3–12)
NEUTROS ABS: 3.1 10*3/uL (ref 1.7–7.7)
NEUTROS PCT: 53 % (ref 43–77)
PLATELETS: 200 10*3/uL (ref 150–400)
RBC: 5.33 MIL/uL (ref 4.22–5.81)
RDW: 13.6 % (ref 11.5–15.5)
WBC: 5.9 10*3/uL (ref 4.0–10.5)

## 2013-09-16 LAB — LIPID PANEL
CHOL/HDL RATIO: 3.6 ratio
Cholesterol: 167 mg/dL (ref 0–200)
HDL: 47 mg/dL (ref >39)
LDL Cholesterol: 102 mg/dL — ABNORMAL HIGH (ref 0–99)
TRIGLYCERIDES: 89 mg/dL (ref <150)
VLDL: 18 mg/dL (ref 0–40)

## 2013-09-16 LAB — HEPATIC FUNCTION PANEL
ALT: 37 U/L (ref 0–53)
AST: 51 U/L — ABNORMAL HIGH (ref 0–37)
Albumin: 4.4 g/dL (ref 3.5–5.2)
Alkaline Phosphatase: 45 U/L (ref 39–117)
BILIRUBIN DIRECT: 0.1 mg/dL (ref 0.0–0.3)
BILIRUBIN INDIRECT: 0.6 mg/dL (ref 0.2–1.2)
BILIRUBIN TOTAL: 0.7 mg/dL (ref 0.2–1.2)
Total Protein: 7.3 g/dL (ref 6.0–8.3)

## 2013-09-16 LAB — BASIC METABOLIC PANEL WITH GFR
BUN: 13 mg/dL (ref 6–23)
CALCIUM: 9.4 mg/dL (ref 8.4–10.5)
CO2: 33 mEq/L — ABNORMAL HIGH (ref 19–32)
Chloride: 100 mEq/L (ref 96–112)
Creat: 1.08 mg/dL (ref 0.50–1.35)
GFR, EST AFRICAN AMERICAN: 89 mL/min
GFR, EST NON AFRICAN AMERICAN: 77 mL/min
GLUCOSE: 93 mg/dL (ref 70–99)
POTASSIUM: 5.1 meq/L (ref 3.5–5.3)
SODIUM: 137 meq/L (ref 135–145)

## 2013-09-16 LAB — MAGNESIUM: MAGNESIUM: 2 mg/dL (ref 1.5–2.5)

## 2013-09-16 LAB — HEMOGLOBIN A1C
Hgb A1c MFr Bld: 5.8 % — ABNORMAL HIGH (ref <5.7)
Mean Plasma Glucose: 120 mg/dL — ABNORMAL HIGH (ref <117)

## 2013-09-16 LAB — TSH: TSH: 1.036 u[IU]/mL (ref 0.350–4.500)

## 2013-09-16 LAB — VITAMIN D 25 HYDROXY (VIT D DEFICIENCY, FRACTURES): Vit D, 25-Hydroxy: 93 ng/mL — ABNORMAL HIGH (ref 30–89)

## 2013-09-17 LAB — INSULIN, FASTING: Insulin fasting, serum: 11 u[IU]/mL (ref 3–28)

## 2013-10-20 ENCOUNTER — Other Ambulatory Visit: Payer: Self-pay | Admitting: *Deleted

## 2013-10-20 MED ORDER — CLONAZEPAM 1 MG PO TABS: ORAL_TABLET | ORAL | Status: DC

## 2013-10-20 MED ORDER — TESTOSTERONE CYPIONATE 200 MG/ML IM SOLN: INTRAMUSCULAR | Status: DC

## 2013-11-25 ENCOUNTER — Encounter: Payer: Self-pay | Admitting: Internal Medicine

## 2013-11-25 ENCOUNTER — Ambulatory Visit (INDEPENDENT_AMBULATORY_CARE_PROVIDER_SITE_OTHER): Payer: BC Managed Care – PPO | Admitting: Internal Medicine

## 2013-11-25 VITALS — BP 142/74 | HR 76 | Temp 100.9°F | Resp 16 | Ht 65.0 in | Wt 219.0 lb

## 2013-11-25 DIAGNOSIS — R5081 Fever presenting with conditions classified elsewhere: Secondary | ICD-10-CM

## 2013-11-25 DIAGNOSIS — J029 Acute pharyngitis, unspecified: Secondary | ICD-10-CM

## 2013-11-25 DIAGNOSIS — J0410 Acute tracheitis without obstruction: Secondary | ICD-10-CM

## 2013-11-25 MED ORDER — AZITHROMYCIN 250 MG PO TABS: ORAL_TABLET | ORAL | Status: DC

## 2013-11-25 MED ORDER — PREDNISONE 20 MG PO TABS: ORAL_TABLET | ORAL | Status: DC

## 2013-11-25 MED ORDER — PREDNISONE 20 MG PO TABS
ORAL_TABLET | ORAL | Status: DC
Start: 2013-11-25 — End: 2013-11-30

## 2013-11-25 NOTE — Progress Notes (Signed)
   Subjective:    Patient ID: ADEDAMOLA SETO, male    DOB: 25-Jul-1960, 53 y.o.   MRN: [id number]  HPI Patient had recent (+)  flu exposure and presents now with head & chest congestion, S/T, fever and aches. No sputum.    Medication Sig  . atorvastatin (LIPITOR) 80 MG tablet Take 40 mg by mouth every 3 (three) days.   . bisoprolol-hydrochlorothiazide (ZIAC) 5-6.25 MG per tablet Take 1 tablet by mouth daily.  . clonazePAM (KLONOPIN) 1 MG tablet take 1/2 to 1 tablet by mouth three times a day if needed for anxiety  . docusate sodium (COLACE) 100 MG capsule Take 100 mg by mouth daily.  Marland Kitchen HYDROmorphone HCl (EXALGO) 8 MG T24A SR tablet Take 8-16 mg by mouth every 12 (twelve) hours as needed (pain).  Marland Kitchen testosterone cypionate (DEPOTESTOTERONE CYPIONATE) 200 MG/ML injection inject 2 milliliters intramuscularly every 2 weeks  . VITAMIN D, CHOLECALCIFEROL, PO Take 10,000 mg by mouth daily.  . VOLTAREN 1 % GEL Apply 2 g topically Daily.   Marland Kitchen oxyCODONE-acetaminophen (PERCOCET) 10-325 MG per tablet Take 1 tablet by mouth every 6 (six) hours as needed for pain.   Allergies  Allergen Reactions  . Penicillins Anaphylaxis  . Ace Inhibitors Other (See Comments)    unknown  . Keflex [Cephalexin] Hives   Past Medical History  Diagnosis Date  . Diverticulitis   . Chronic pain of left knee   . Hyperlipidemia   . Labile hypertension   . Anxiety   . Prediabetes   . IBS (irritable bowel syndrome)   . Other testicular hypofunction   . Allergy   . Vitamin D deficiency   . Sleep apnea     uses CPAP  . Kidney stones   . Arthritis    Review of Systems  ROS In addition to the HPI above,  (+) Fever-chills,  (+) Headache, No changes with Vision or hearing,  No problems swallowing food or Liquids,  No Chest pain or productive Cough or Shortness of Breath,  No Abdominal pain, No Nausea or Vomitting,  No new skin rashes or bruises,  No new joints pains-aches,  (+) myalgias No new weakness, tingling,  numbness in any extremity,  No significant Mental Stressors.  A full 10 point Review of Systems was done, except as stated above, all other Review of Systems were negative  Objective:   Physical Exam BP 142/74  P 76  T 100.9 F   Resp 16  Ht 5\' 5"    Wt 219 lb  BMI 36.44  HEENT - Eac's patent. TM's Nl. EOM's full. PERRLA. (+) Bilat sinus tenderness. O/P 2(+) injected w/o exudates. Neck - supple. Nl Thyroid. No nodes, JVD Chest - Clear equal BS w/few scattered  Rales and no rhonchi, wheezes. Cor - Nl HS. RRR w/o sig MGR. PP 1(+). No edema. Abd - No palpable organomegaly, masses or tenderness. BS nl. MS- FROM w/o deformities. Muscle power, tone and bulk Nl. Gait Nl. Neuro - No obvious Cr N abnormalities. Sensory, motor and Cerebellar functions appear Nl w/o focal abnormalities. Psyche - Mental status normal & appropriate.    Assessment & Plan:   1. Acute tracheitis without mention of obstruction  -Rx Z Pak x 1 rf & Rx Prednisone pulse/taper  2. Acute pharyngitis, unspecified pharyngitis type   3. Fever presenting with conditions classified elsewhere  - POC Influenza A&B - Negative

## 2013-11-29 ENCOUNTER — Other Ambulatory Visit: Payer: Self-pay | Admitting: Orthopedic Surgery

## 2013-11-29 DIAGNOSIS — M810 Age-related osteoporosis without current pathological fracture: Secondary | ICD-10-CM

## 2013-11-29 DIAGNOSIS — M899 Disorder of bone, unspecified: Secondary | ICD-10-CM

## 2013-11-29 DIAGNOSIS — M949 Disorder of cartilage, unspecified: Principal | ICD-10-CM

## 2013-11-30 ENCOUNTER — Encounter: Payer: Self-pay | Admitting: Internal Medicine

## 2013-11-30 ENCOUNTER — Ambulatory Visit (INDEPENDENT_AMBULATORY_CARE_PROVIDER_SITE_OTHER): Payer: BC Managed Care – PPO | Admitting: Internal Medicine

## 2013-11-30 VITALS — BP 144/82 | HR 64 | Temp 98.1°F | Resp 16 | Ht 65.0 in | Wt 215.4 lb

## 2013-11-30 DIAGNOSIS — R0989 Other specified symptoms and signs involving the circulatory and respiratory systems: Secondary | ICD-10-CM

## 2013-11-30 DIAGNOSIS — E291 Testicular hypofunction: Secondary | ICD-10-CM

## 2013-11-30 DIAGNOSIS — R7402 Elevation of levels of lactic acid dehydrogenase (LDH): Secondary | ICD-10-CM

## 2013-11-30 DIAGNOSIS — E559 Vitamin D deficiency, unspecified: Secondary | ICD-10-CM

## 2013-11-30 DIAGNOSIS — R7303 Prediabetes: Secondary | ICD-10-CM

## 2013-11-30 DIAGNOSIS — R7401 Elevation of levels of liver transaminase levels: Secondary | ICD-10-CM

## 2013-11-30 DIAGNOSIS — Z113 Encounter for screening for infections with a predominantly sexual mode of transmission: Secondary | ICD-10-CM

## 2013-11-30 DIAGNOSIS — R740 Nonspecific elevation of levels of transaminase and lactic acid dehydrogenase [LDH]: Secondary | ICD-10-CM

## 2013-11-30 DIAGNOSIS — R7309 Other abnormal glucose: Secondary | ICD-10-CM

## 2013-11-30 DIAGNOSIS — E785 Hyperlipidemia, unspecified: Secondary | ICD-10-CM

## 2013-11-30 DIAGNOSIS — I10 Essential (primary) hypertension: Secondary | ICD-10-CM

## 2013-11-30 DIAGNOSIS — Z0000 Encounter for general adult medical examination without abnormal findings: Secondary | ICD-10-CM

## 2013-11-30 DIAGNOSIS — Z111 Encounter for screening for respiratory tuberculosis: Secondary | ICD-10-CM

## 2013-11-30 DIAGNOSIS — Z1212 Encounter for screening for malignant neoplasm of rectum: Secondary | ICD-10-CM

## 2013-11-30 DIAGNOSIS — Z125 Encounter for screening for malignant neoplasm of prostate: Secondary | ICD-10-CM

## 2013-11-30 DIAGNOSIS — Z23 Encounter for immunization: Secondary | ICD-10-CM

## 2013-11-30 LAB — CBC WITH DIFFERENTIAL/PLATELET
BASOS ABS: 0.1 10*3/uL (ref 0.0–0.1)
Basophils Relative: 1 % (ref 0–1)
EOS PCT: 2 % (ref 0–5)
Eosinophils Absolute: 0.1 10*3/uL (ref 0.0–0.7)
HCT: 49.4 % (ref 39.0–52.0)
HEMOGLOBIN: 17 g/dL (ref 13.0–17.0)
Lymphocytes Relative: 29 % (ref 12–46)
Lymphs Abs: 1.7 10*3/uL (ref 0.7–4.0)
MCH: 31.9 pg (ref 26.0–34.0)
MCHC: 34.4 g/dL (ref 30.0–36.0)
MCV: 92.7 fL (ref 78.0–100.0)
MONOS PCT: 11 % (ref 3–12)
Monocytes Absolute: 0.6 10*3/uL (ref 0.1–1.0)
NEUTROS ABS: 3.3 10*3/uL (ref 1.7–7.7)
Neutrophils Relative %: 57 % (ref 43–77)
Platelets: 232 10*3/uL (ref 150–400)
RBC: 5.33 MIL/uL (ref 4.22–5.81)
RDW: 13.3 % (ref 11.5–15.5)
WBC: 5.8 10*3/uL (ref 4.0–10.5)

## 2013-11-30 NOTE — Patient Instructions (Signed)
   Recommend the book "The END of DIETING" by Dr Holly Furman   and the book "The END of DIABETES " by Dr Graeden Fuhrman  At [employer] - get book & Audio CD's      Being diabetic has a  300% increased risk for heart attack, stroke, cancer, and alzheimer- type vascular dementia. It is very important that you work harder with diet by avoiding all foods that are white except chicken & fish. Avoid white rice (brown & wild rice is OK), white potatoes (sweetpotatoes in moderation is OK), White bread or wheat bread or anything made out of white flour like bagels, donuts, rolls, buns, biscuits, cakes, pastries, cookies, pizza crust, and pasta (made from white flour & egg whites) - vegetarian pasta or spinach or wheat pasta is OK. Multigrain breads like Arnold's or Pepperidge Farm, or multigrain sandwich thins or flatbreads.  Diet, exercise and weight loss can reverse and cure diabetes in the early stages.  Diet, exercise and weight loss is very important in the control and prevention of complications of diabetes which affects every system in your body, ie. Brain - dementia/stroke, eyes - glaucoma/blindness, heart - heart attack/heart failure, kidneys - dialysis, stomach - gastric paralysis, intestines - malabsorption, nerves - severe painful neuritis, circulation - gangrene & loss of a leg(s), and finally cancer and Alzheimers.    I recommend avoid fried & greasy foods,  sweets/candy, white rice (brown or wild rice or Quinoa is OK), white potatoes (sweet potatoes are OK) - anything made from white flour - bagels, doughnuts, rolls, buns, biscuits,white and wheat breads, pizza crust and traditional pasta made of white flour & egg white(vegetarian pasta or spinach or wheat pasta is OK).  Multi-grain bread is OK - like multi-grain flat bread or sandwich thins. Avoid alcohol in excess. Exercise is also important.    Eat all the vegetables you want - avoid meat, especially red meat and dairy - especially cheese.  Cheese  is the most concentrated form of trans-fats which is the worst thing to clog up our arteries. Veggie cheese is OK which can be found in the fresh produce section at [employer] or [employer] or [employer]   
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
## 2013-12-01 LAB — IRON AND TIBC
%SAT: 24 % (ref 20–55)
Iron: 87 ug/dL (ref 42–165)
TIBC: 369 ug/dL (ref 215–435)
UIBC: 282 ug/dL (ref 125–400)

## 2013-12-01 LAB — HEMOGLOBIN A1C
HEMOGLOBIN A1C: 5.7 % — AB (ref <5.7)
MEAN PLASMA GLUCOSE: 117 mg/dL — AB (ref <117)

## 2013-12-01 LAB — HEPATIC FUNCTION PANEL
ALBUMIN: 4.8 g/dL (ref 3.5–5.2)
ALT: 17 U/L (ref 0–53)
AST: 19 U/L (ref 0–37)
Alkaline Phosphatase: 53 U/L (ref 39–117)
Bilirubin, Direct: 0.1 mg/dL (ref 0.0–0.3)
Indirect Bilirubin: 0.5 mg/dL (ref 0.2–1.2)
TOTAL PROTEIN: 7.4 g/dL (ref 6.0–8.3)
Total Bilirubin: 0.6 mg/dL (ref 0.2–1.2)

## 2013-12-01 LAB — BASIC METABOLIC PANEL WITH GFR
BUN: 15 mg/dL (ref 6–23)
CO2: 30 mEq/L (ref 19–32)
Calcium: 10.1 mg/dL (ref 8.4–10.5)
Chloride: 99 mEq/L (ref 96–112)
Creat: 0.94 mg/dL (ref 0.50–1.35)
GFR, Est African American: >89 mL/min
Glucose, Bld: 95 mg/dL (ref 70–99)
POTASSIUM: 4.8 meq/L (ref 3.5–5.3)
Sodium: 138 mEq/L (ref 135–145)

## 2013-12-01 LAB — URINALYSIS, MICROSCOPIC ONLY
BACTERIA UA: NONE SEEN
Casts: NONE SEEN
Squamous Epithelial / LPF: NONE SEEN

## 2013-12-01 LAB — LIPID PANEL
CHOLESTEROL: 152 mg/dL (ref 0–200)
HDL: 42 mg/dL (ref >39)
LDL Cholesterol: 82 mg/dL (ref 0–99)
Total CHOL/HDL Ratio: 3.6 Ratio
Triglycerides: 140 mg/dL (ref <150)
VLDL: 28 mg/dL (ref 0–40)

## 2013-12-01 LAB — TSH: TSH: 0.359 u[IU]/mL (ref 0.350–4.500)

## 2013-12-01 LAB — INSULIN, FASTING: INSULIN FASTING, SERUM: 10.6 u[IU]/mL (ref 2.0–19.6)

## 2013-12-01 LAB — VITAMIN D 25 HYDROXY (VIT D DEFICIENCY, FRACTURES): Vit D, 25-Hydroxy: 98 ng/mL — ABNORMAL HIGH (ref 30–89)

## 2013-12-01 LAB — MICROALBUMIN / CREATININE URINE RATIO
Creatinine, Urine: 172.4 mg/dL
MICROALB/CREAT RATIO: 36.5 mg/g — AB (ref 0.0–30.0)
Microalb, Ur: 6.3 mg/dL — ABNORMAL HIGH (ref <2.0)

## 2013-12-01 LAB — TESTOSTERONE: Testosterone: 2252.47 ng/dL — ABNORMAL HIGH (ref 300–890)

## 2013-12-01 LAB — MAGNESIUM: Magnesium: 2.1 mg/dL (ref 1.5–2.5)

## 2013-12-01 LAB — VITAMIN B12: VITAMIN B 12: 595 pg/mL (ref 211–911)

## 2013-12-01 LAB — PSA: PSA: 1.12 ng/mL (ref <=4.00)

## 2013-12-02 LAB — TB SKIN TEST
Induration: 0 mm
TB Skin Test: NEGATIVE

## 2013-12-15 ENCOUNTER — Ambulatory Visit
Admission: RE | Admit: 2013-12-15 | Discharge: 2013-12-15 | Disposition: A | Discharge status: Home / Self Care | Payer: BC Managed Care – PPO | Source: Ambulatory Visit | Attending: Orthopedic Surgery | Admitting: Orthopedic Surgery

## 2013-12-15 DIAGNOSIS — M949 Disorder of cartilage, unspecified: Secondary | ICD-10-CM

## 2013-12-15 DIAGNOSIS — M899 Disorder of bone, unspecified: Secondary | ICD-10-CM

## 2013-12-15 DIAGNOSIS — M810 Age-related osteoporosis without current pathological fracture: Secondary | ICD-10-CM

## 2013-12-16 ENCOUNTER — Other Ambulatory Visit: Payer: Self-pay

## 2014-02-01 ENCOUNTER — Other Ambulatory Visit: Payer: Self-pay | Admitting: Internal Medicine

## 2014-02-01 DIAGNOSIS — F4329 Adjustment disorder with other symptoms: Secondary | ICD-10-CM

## 2014-02-01 MED ORDER — CLONAZEPAM 1 MG PO TABS: ORAL_TABLET | ORAL | Status: DC

## 2014-02-01 NOTE — Progress Notes (Signed)
Called Rx into [facility] 

## 2014-02-27 ENCOUNTER — Other Ambulatory Visit (INDEPENDENT_AMBULATORY_CARE_PROVIDER_SITE_OTHER): Payer: BC Managed Care – PPO

## 2014-02-27 DIAGNOSIS — Z1212 Encounter for screening for malignant neoplasm of rectum: Secondary | ICD-10-CM

## 2014-02-27 LAB — POC HEMOCCULT BLD/STL (HOME/3-CARD/SCREEN)
FECAL OCCULT BLD: NEGATIVE
FECAL OCCULT BLD: NEGATIVE
Fecal Occult Blood, POC: NEGATIVE

## 2014-03-20 ENCOUNTER — Other Ambulatory Visit: Payer: Self-pay | Admitting: Internal Medicine

## 2014-03-27 ENCOUNTER — Ambulatory Visit (INDEPENDENT_AMBULATORY_CARE_PROVIDER_SITE_OTHER): Payer: BLUE CROSS/BLUE SHIELD | Admitting: Internal Medicine

## 2014-03-27 ENCOUNTER — Encounter: Payer: Self-pay | Admitting: Internal Medicine

## 2014-03-27 VITALS — BP 124/66 | HR 76 | Temp 98.2°F | Resp 16 | Ht 65.0 in | Wt 221.2 lb

## 2014-03-27 DIAGNOSIS — R7309 Other abnormal glucose: Secondary | ICD-10-CM

## 2014-03-27 DIAGNOSIS — E785 Hyperlipidemia, unspecified: Secondary | ICD-10-CM

## 2014-03-27 DIAGNOSIS — I10 Essential (primary) hypertension: Secondary | ICD-10-CM

## 2014-03-27 DIAGNOSIS — E559 Vitamin D deficiency, unspecified: Secondary | ICD-10-CM

## 2014-03-27 DIAGNOSIS — Z79899 Other long term (current) drug therapy: Secondary | ICD-10-CM

## 2014-03-27 DIAGNOSIS — R0989 Other specified symptoms and signs involving the circulatory and respiratory systems: Secondary | ICD-10-CM

## 2014-03-27 DIAGNOSIS — R7303 Prediabetes: Secondary | ICD-10-CM

## 2014-03-27 LAB — CBC WITH DIFFERENTIAL/PLATELET
Basophils Absolute: 0.1 10*3/uL (ref 0.0–0.1)
Basophils Relative: 1 % (ref 0–1)
Eosinophils Absolute: 0.1 10*3/uL (ref 0.0–0.7)
Eosinophils Relative: 1 % (ref 0–5)
HCT: 47.6 % (ref 39.0–52.0)
HEMOGLOBIN: 16.1 g/dL (ref 13.0–17.0)
LYMPHS PCT: 18 % (ref 12–46)
Lymphs Abs: 1.2 10*3/uL (ref 0.7–4.0)
MCH: 31 pg (ref 26.0–34.0)
MCHC: 33.8 g/dL (ref 30.0–36.0)
MCV: 91.7 fL (ref 78.0–100.0)
MPV: 11 fL (ref 8.6–12.4)
Monocytes Absolute: 0.6 10*3/uL (ref 0.1–1.0)
Monocytes Relative: 10 % (ref 3–12)
NEUTROS ABS: 4.5 10*3/uL (ref 1.7–7.7)
NEUTROS PCT: 70 % (ref 43–77)
PLATELETS: 211 10*3/uL (ref 150–400)
RBC: 5.19 MIL/uL (ref 4.22–5.81)
RDW: 13 % (ref 11.5–15.5)
WBC: 6.4 10*3/uL (ref 4.0–10.5)

## 2014-03-27 LAB — HEMOGLOBIN A1C
HEMOGLOBIN A1C: 5.5 % (ref <5.7)
MEAN PLASMA GLUCOSE: 111 mg/dL (ref <117)
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
## 2014-03-27 NOTE — Patient Instructions (Signed)
   Recommend the book "The END of DIETING" by Dr Ariq Fuhrman   & the book "The END of DIABETES " by Dr Maria Fuhrman  At [employer] - get book & Audio CD's      Being diabetic has a  300% increased risk for heart attack, stroke, cancer, and alzheimer- type vascular dementia. It is very important that you work harder with diet by avoiding all foods that are white except chicken & fish. Avoid white rice (brown & wild rice is OK), white potatoes (sweetpotatoes in moderation is OK), White bread or wheat bread or anything made out of white flour like bagels, donuts, rolls, buns, biscuits, cakes, pastries, cookies, pizza crust, and pasta (made from white flour & egg whites) - vegetarian pasta or spinach or wheat pasta is OK. Multigrain breads like Arnold's or Pepperidge Farm, or multigrain sandwich thins or flatbreads.  Diet, exercise and weight loss can reverse and cure diabetes in the early stages.  Diet, exercise and weight loss is very important in the control and prevention of complications of diabetes which affects every system in your body, ie. Brain - dementia/stroke, eyes - glaucoma/blindness, heart - heart attack/heart failure, kidneys - dialysis, stomach - gastric paralysis, intestines - malabsorption, nerves - severe painful neuritis, circulation - gangrene & loss of a leg(s), and finally cancer and Alzheimers.    I recommend avoid fried & greasy foods,  sweets/candy, white rice (brown or wild rice or Quinoa is OK), white potatoes (sweet potatoes are OK) - anything made from white flour - bagels, doughnuts, rolls, buns, biscuits,white and wheat breads, pizza crust and traditional pasta made of white flour & egg white(vegetarian pasta or spinach or wheat pasta is OK).  Multi-grain bread is OK - like multi-grain flat bread or sandwich thins. Avoid alcohol in excess. Exercise is also important.    Eat all the vegetables you want - avoid meat, especially red meat and dairy - especially cheese.  Cheese  is the most concentrated form of trans-fats which is the worst thing to clog up our arteries. Veggie cheese is OK which can be found in the fresh produce section at [employer] or [employer] or [employer]   

## 2014-03-28 LAB — LIPID PANEL
CHOL/HDL RATIO: 3.2 ratio
Cholesterol: 158 mg/dL (ref 0–200)
HDL: 49 mg/dL (ref >39)
LDL CALC: 83 mg/dL (ref 0–99)
TRIGLYCERIDES: 131 mg/dL (ref <150)
VLDL: 26 mg/dL (ref 0–40)

## 2014-03-28 LAB — BASIC METABOLIC PANEL WITH GFR
BUN: 11 mg/dL (ref 6–23)
CO2: 30 mEq/L (ref 19–32)
Calcium: 9.5 mg/dL (ref 8.4–10.5)
Chloride: 101 mEq/L (ref 96–112)
Creat: 0.97 mg/dL (ref 0.50–1.35)
GFR, EST NON AFRICAN AMERICAN: 88 mL/min
Glucose, Bld: 88 mg/dL (ref 70–99)
POTASSIUM: 4.5 meq/L (ref 3.5–5.3)
Sodium: 137 mEq/L (ref 135–145)

## 2014-03-28 LAB — INSULIN, FASTING: INSULIN FASTING, SERUM: 9 u[IU]/mL (ref 2.0–19.6)

## 2014-03-28 LAB — VITAMIN D 25 HYDROXY (VIT D DEFICIENCY, FRACTURES): Vit D, 25-Hydroxy: 73 ng/mL (ref 30–100)

## 2014-03-28 LAB — HEPATIC FUNCTION PANEL
ALT: 19 U/L (ref 0–53)
AST: 23 U/L (ref 0–37)
Albumin: 4.5 g/dL (ref 3.5–5.2)
Alkaline Phosphatase: 48 U/L (ref 39–117)
BILIRUBIN INDIRECT: 0.5 mg/dL (ref 0.2–1.2)
Bilirubin, Direct: 0.1 mg/dL (ref 0.0–0.3)
Total Bilirubin: 0.6 mg/dL (ref 0.2–1.2)
Total Protein: 6.8 g/dL (ref 6.0–8.3)

## 2014-03-28 LAB — TSH: TSH: 0.938 u[IU]/mL (ref 0.350–4.500)

## 2014-03-28 LAB — MAGNESIUM: Magnesium: 2 mg/dL (ref 1.5–2.5)

## 2014-04-29 ENCOUNTER — Encounter: Payer: Self-pay | Admitting: *Deleted

## 2014-05-17 ENCOUNTER — Other Ambulatory Visit: Payer: Self-pay | Admitting: *Deleted

## 2014-05-17 DIAGNOSIS — F4329 Adjustment disorder with other symptoms: Secondary | ICD-10-CM

## 2014-05-17 MED ORDER — TESTOSTERONE CYPIONATE 200 MG/ML IM SOLN
INTRAMUSCULAR | Status: DC
Start: 2014-05-17 — End: 2014-09-18

## 2014-05-17 MED ORDER — CLONAZEPAM 1 MG PO TABS: ORAL_TABLET | ORAL | Status: DC

## 2014-05-17 MED ORDER — SYRINGE/NEEDLE (DISP) 21G X 1" 3 ML MISC
Status: DC
Start: 2014-05-17 — End: 2014-09-18

## 2014-07-04 ENCOUNTER — Ambulatory Visit (INDEPENDENT_AMBULATORY_CARE_PROVIDER_SITE_OTHER): Payer: BLUE CROSS/BLUE SHIELD | Admitting: Internal Medicine

## 2014-07-04 ENCOUNTER — Encounter: Payer: Self-pay | Admitting: Internal Medicine

## 2014-07-04 VITALS — BP 140/78 | HR 76 | Temp 97.9°F | Resp 16 | Ht 65.0 in | Wt 214.8 lb

## 2014-07-04 DIAGNOSIS — E291 Testicular hypofunction: Secondary | ICD-10-CM

## 2014-07-04 DIAGNOSIS — E349 Endocrine disorder, unspecified: Secondary | ICD-10-CM

## 2014-07-04 DIAGNOSIS — R7303 Prediabetes: Secondary | ICD-10-CM

## 2014-07-04 DIAGNOSIS — R0989 Other specified symptoms and signs involving the circulatory and respiratory systems: Secondary | ICD-10-CM

## 2014-07-04 DIAGNOSIS — Z79899 Other long term (current) drug therapy: Secondary | ICD-10-CM

## 2014-07-04 DIAGNOSIS — E785 Hyperlipidemia, unspecified: Secondary | ICD-10-CM

## 2014-07-04 DIAGNOSIS — I10 Essential (primary) hypertension: Secondary | ICD-10-CM

## 2014-07-04 DIAGNOSIS — R7309 Other abnormal glucose: Secondary | ICD-10-CM

## 2014-07-04 DIAGNOSIS — E559 Vitamin D deficiency, unspecified: Secondary | ICD-10-CM

## 2014-07-04 LAB — CBC WITH DIFFERENTIAL/PLATELET
Basophils Absolute: 0.1 10*3/uL (ref 0.0–0.1)
Basophils Relative: 1 % (ref 0–1)
EOS PCT: 1 % (ref 0–5)
Eosinophils Absolute: 0.1 10*3/uL (ref 0.0–0.7)
HCT: 50 % (ref 39.0–52.0)
Hemoglobin: 17.3 g/dL — ABNORMAL HIGH (ref 13.0–17.0)
LYMPHS ABS: 0.9 10*3/uL (ref 0.7–4.0)
LYMPHS PCT: 14 % (ref 12–46)
MCH: 31.7 pg (ref 26.0–34.0)
MCHC: 34.6 g/dL (ref 30.0–36.0)
MCV: 91.6 fL (ref 78.0–100.0)
MONO ABS: 0.7 10*3/uL (ref 0.1–1.0)
MONOS PCT: 10 % (ref 3–12)
MPV: 12.3 fL (ref 8.6–12.4)
Neutro Abs: 5 10*3/uL (ref 1.7–7.7)
Neutrophils Relative %: 74 % (ref 43–77)
Platelets: 213 10*3/uL (ref 150–400)
RBC: 5.46 MIL/uL (ref 4.22–5.81)
RDW: 13.3 % (ref 11.5–15.5)
WBC: 6.7 10*3/uL (ref 4.0–10.5)

## 2014-07-04 NOTE — Patient Instructions (Signed)
Recommend Low dose or baby Aspirin 81 mg daily   To reduce risk of Colon Cancer 20 %, Skin Cancer 26 % , Melanoma 46% and   Pancreatic cancer 60%  ++++++++++++++++++ Vitamin D goal is between 70-100.   Please make sure that you are taking your Vitamin D as directed.   It is very important as a natural antiinflammatory   helping hair, skin, and nails, as well as reducing stroke and heart attack risk.   It helps your bones and helps with mood.  It also decreases numerous cancer risks so please take it as directed.   Low Vit D is associated with a 200-300% higher risk for CANCER   and 200-300% higher risk for HEART   ATTACK  &  STROKE.    ......................................  It is also associated with higher death rate at younger ages,   autoimmune diseases like Rheumatoid arthritis, Lupus, Multiple Sclerosis.     Also many other serious conditions, like depression, Alzheimer's  Dementia, infertility, muscle aches, fatigue, fibromyalgia - just to name a few.  +++++++++++++++++++    Recommend the book "The END of DIETING" by Dr Ralph Fuhrman   & the book "The END of DIABETES " by Dr Helen Fuhrman  At [employer] - get book & Audio CD's     Being diabetic has a  300% increased risk for heart attack, stroke, cancer, and alzheimer- type vascular dementia. It is very important that you work harder with diet by avoiding all foods that are white. Avoid white rice (brown & wild rice is OK), white potatoes (sweetpotatoes in moderation is OK), White bread or wheat bread or anything made out of white flour like bagels, donuts, rolls, buns, biscuits, cakes, pastries, cookies, pizza crust, and pasta (made from white flour & egg whites) - vegetarian pasta or spinach or wheat pasta is OK. Multigrain breads like Arnold's or Pepperidge Farm, or multigrain sandwich thins or flatbreads.  Diet, exercise and weight loss can reverse and cure diabetes in the early stages.  Diet, exercise and weight  loss is very important in the control and prevention of complications of diabetes which affects every system in your body, ie. Brain - dementia/stroke, eyes - glaucoma/blindness, heart - heart attack/heart failure, kidneys - dialysis, stomach - gastric paralysis, intestines - malabsorption, nerves - severe painful neuritis, circulation - gangrene & loss of a leg(s), and finally cancer and Alzheimers.    I recommend avoid fried & greasy foods,  sweets/candy, white rice (brown or wild rice or Quinoa is OK), white potatoes (sweet potatoes are OK) - anything made from white flour - bagels, doughnuts, rolls, buns, biscuits,white and wheat breads, pizza crust and traditional pasta made of white flour & egg white(vegetarian pasta or spinach or wheat pasta is OK).  Multi-grain bread is OK - like multi-grain flat bread or sandwich thins. Avoid alcohol in excess. Exercise is also important.    Eat all the vegetables you want - avoid meat, especially red meat and dairy - especially cheese.  Cheese is the most concentrated form of trans-fats which is the worst thing to clog up our arteries. Veggie cheese is OK which can be found in the fresh produce section at [employer] or [employer] or [employer]  ++++++++++++++++++++++++++  

## 2014-07-05 LAB — HEMOGLOBIN A1C
Hgb A1c MFr Bld: 5.6 % (ref <5.7)
MEAN PLASMA GLUCOSE: 114 mg/dL (ref <117)

## 2014-07-05 LAB — LIPID PANEL
CHOL/HDL RATIO: 3.2 ratio
CHOLESTEROL: 177 mg/dL (ref 0–200)
HDL: 55 mg/dL (ref >=40)
LDL CALC: 94 mg/dL (ref 0–99)
TRIGLYCERIDES: 138 mg/dL (ref <150)
VLDL: 28 mg/dL (ref 0–40)

## 2014-07-05 LAB — HEPATIC FUNCTION PANEL
ALT: 27 U/L (ref 0–53)
AST: 29 U/L (ref 0–37)
Albumin: 4.6 g/dL (ref 3.5–5.2)
Alkaline Phosphatase: 48 U/L (ref 39–117)
BILIRUBIN INDIRECT: 0.6 mg/dL (ref 0.2–1.2)
Bilirubin, Direct: 0.2 mg/dL (ref 0.0–0.3)
TOTAL PROTEIN: 7.3 g/dL (ref 6.0–8.3)
Total Bilirubin: 0.8 mg/dL (ref 0.2–1.2)

## 2014-07-05 LAB — BASIC METABOLIC PANEL WITH GFR
BUN: 12 mg/dL (ref 6–23)
CO2: 27 mEq/L (ref 19–32)
Calcium: 9.6 mg/dL (ref 8.4–10.5)
Chloride: 99 mEq/L (ref 96–112)
Creat: 0.93 mg/dL (ref 0.50–1.35)
GFR, Est African American: >89 mL/min
GLUCOSE: 107 mg/dL — AB (ref 70–99)
POTASSIUM: 4.2 meq/L (ref 3.5–5.3)
Sodium: 136 mEq/L (ref 135–145)

## 2014-07-05 LAB — TESTOSTERONE: TESTOSTERONE: 1709.57 ng/dL — AB (ref 300–890)

## 2014-07-05 LAB — INSULIN, RANDOM: Insulin: 11.1 u[IU]/mL (ref 2.0–19.6)

## 2014-07-05 LAB — VITAMIN D 25 HYDROXY (VIT D DEFICIENCY, FRACTURES): Vit D, 25-Hydroxy: 72 ng/mL (ref 30–100)

## 2014-07-05 LAB — TSH: TSH: 0.708 u[IU]/mL (ref 0.350–4.500)

## 2014-07-05 LAB — MAGNESIUM: Magnesium: 2 mg/dL (ref 1.5–2.5)

## 2014-07-08 ENCOUNTER — Encounter: Payer: Self-pay | Admitting: Internal Medicine
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
## 2014-08-07 ENCOUNTER — Encounter: Payer: Self-pay | Admitting: Internal Medicine

## 2014-08-22 ENCOUNTER — Encounter: Payer: Self-pay | Admitting: Internal Medicine

## 2014-09-18 ENCOUNTER — Other Ambulatory Visit: Payer: Self-pay | Admitting: *Deleted

## 2014-09-18 ENCOUNTER — Ambulatory Visit: Payer: BLUE CROSS/BLUE SHIELD | Admitting: Internal Medicine

## 2014-09-18 ENCOUNTER — Encounter: Payer: Self-pay | Admitting: Internal Medicine

## 2014-09-18 VITALS — BP 154/92 | HR 88 | Temp 97.9°F | Resp 16 | Ht 65.0 in | Wt 224.6 lb

## 2014-09-18 DIAGNOSIS — L03032 Cellulitis of left toe: Secondary | ICD-10-CM

## 2014-09-18 DIAGNOSIS — R0989 Other specified symptoms and signs involving the circulatory and respiratory systems: Secondary | ICD-10-CM

## 2014-09-18 DIAGNOSIS — S91209S Unspecified open wound of unspecified toe(s) with damage to nail, sequela: Secondary | ICD-10-CM

## 2014-09-18 DIAGNOSIS — B351 Tinea unguium: Secondary | ICD-10-CM

## 2014-09-18 MED ORDER — DOXYCYCLINE HYCLATE 100 MG PO CAPS: ORAL_CAPSULE | ORAL | Status: AC

## 2014-09-18 MED ORDER — TERBINAFINE HCL 250 MG PO TABS: ORAL_TABLET | ORAL | Status: AC

## 2014-09-18 MED ORDER — "SYRINGE/NEEDLE (DISP) 21G X 1"" 3 ML MISC": Status: DC

## 2014-09-18 MED ORDER — TESTOSTERONE CYPIONATE 200 MG/ML IM SOLN: INTRAMUSCULAR | Status: DC

## 2014-09-18 NOTE — Progress Notes (Signed)
   Subjective:    Patient ID: [name], male    DOB: 17-Aug-1960, 54 y.o.   MRN: [id number]  HPI Patient presents with a partial avulsion of a left great dystrophic toe nail. Remote hx/o prior nail extraction by podiatrist, Dr Paulla Dolly for toenail fungus. Patient's BP is noted elevated & attributed to partial toenail avulsion.  Meds, Allergies, PMHx reviewed & unchanged.   Review of Systems  10 point systems review negative except as above.    Objective:   Physical Exam  BP 154/92 mmHg  Pulse 88  Temp(Src) 97.9 F (36.6 C)  Resp 16  Ht 5\' 5"  (1.651 m)  Wt 224 lb 9.6 oz (101.878 kg)  BMI 37.38 kg/m2  A thickened dystrophic left great toenail partially elevates from the distal nail bed. There is surrounding cellulitic changes in the paronychial area. Capillary refill is nl to distal toe tip.   Procedure (CPT: 64332) - Excision of toenail         With informed consent and after aseptic prep with alcohol, then local anes with lidocaine 1% x 2.5 ml,  the dystrophic nail was sharply debrided free from the nail bed and the area was scrubbed with alcohol and then antibiotic ointment was applied & covered with a sterile gauze and secured with an elastic reinforcement. Patient was instructed in wound care.    Assessment & Plan:   1. Hypertension   2. Toenail avulsion, sequela  - Excise nail   3. Cellulitis of toe of left foot  - doxycycline (VIBRAMYCIN) 100 MG capsule; Take 2 capsules at once on a full stomach, then take 1 capsule 1 x day on a full stomach for infection for 1 month for skin infection  Dispense: 30 capsule; Refill: 0 - advised avid prolonged sun exposure while on TCN  4. Fungal infection of toenail  - terbinafine (LAMISIL) 250 MG tablet; Take 1 tablet daily for fungal toenail infection  Dispense: 90 tablet; Refill: 0 - advised check LFT's in ~ 6 weeks.

## 2014-09-21 ENCOUNTER — Ambulatory Visit: Payer: Self-pay | Admitting: Internal Medicine

## 2014-10-24 ENCOUNTER — Ambulatory Visit (INDEPENDENT_AMBULATORY_CARE_PROVIDER_SITE_OTHER): Payer: BLUE CROSS/BLUE SHIELD | Admitting: Internal Medicine

## 2014-10-24 ENCOUNTER — Encounter: Payer: Self-pay | Admitting: Internal Medicine

## 2014-10-24 VITALS — BP 152/84 | HR 72 | Temp 97.3°F | Resp 16 | Ht 65.0 in | Wt 221.0 lb

## 2014-10-24 DIAGNOSIS — J029 Acute pharyngitis, unspecified: Secondary | ICD-10-CM | POA: Diagnosis not present

## 2014-10-24 DIAGNOSIS — S46912A Strain of unspecified muscle, fascia and tendon at shoulder and upper arm level, left arm, initial encounter: Secondary | ICD-10-CM

## 2014-10-24 MED ORDER — DEXAMETHASONE SODIUM PHOSPHATE 100 MG/10ML IJ SOLN
10.0000 mg | Freq: Once | INTRAMUSCULAR | Status: AC
  Administered 2014-10-24: 10 mg via INTRAMUSCULAR
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
## 2014-11-13 ENCOUNTER — Other Ambulatory Visit: Payer: Self-pay | Admitting: Internal Medicine

## 2014-12-06 ENCOUNTER — Encounter: Payer: Self-pay | Admitting: Internal Medicine

## 2014-12-06 ENCOUNTER — Ambulatory Visit (INDEPENDENT_AMBULATORY_CARE_PROVIDER_SITE_OTHER): Payer: BLUE CROSS/BLUE SHIELD | Admitting: Internal Medicine

## 2014-12-06 VITALS — BP 152/90 | HR 56 | Temp 97.5°F | Resp 16 | Ht 65.0 in | Wt 213.2 lb

## 2014-12-06 DIAGNOSIS — R5383 Other fatigue: Secondary | ICD-10-CM

## 2014-12-06 DIAGNOSIS — Z1212 Encounter for screening for malignant neoplasm of rectum: Secondary | ICD-10-CM

## 2014-12-06 DIAGNOSIS — R0989 Other specified symptoms and signs involving the circulatory and respiratory systems: Secondary | ICD-10-CM

## 2014-12-06 DIAGNOSIS — Z111 Encounter for screening for respiratory tuberculosis: Secondary | ICD-10-CM

## 2014-12-06 DIAGNOSIS — Z0001 Encounter for general adult medical examination with abnormal findings: Secondary | ICD-10-CM

## 2014-12-06 DIAGNOSIS — Z6835 Body mass index (BMI) 35.0-35.9, adult: Secondary | ICD-10-CM

## 2014-12-06 DIAGNOSIS — I10 Essential (primary) hypertension: Secondary | ICD-10-CM

## 2014-12-06 DIAGNOSIS — F411 Generalized anxiety disorder: Secondary | ICD-10-CM

## 2014-12-06 DIAGNOSIS — Z79899 Other long term (current) drug therapy: Secondary | ICD-10-CM

## 2014-12-06 DIAGNOSIS — E559 Vitamin D deficiency, unspecified: Secondary | ICD-10-CM

## 2014-12-06 DIAGNOSIS — Z23 Encounter for immunization: Secondary | ICD-10-CM

## 2014-12-06 DIAGNOSIS — Z125 Encounter for screening for malignant neoplasm of prostate: Secondary | ICD-10-CM | POA: Diagnosis not present

## 2014-12-06 DIAGNOSIS — E785 Hyperlipidemia, unspecified: Secondary | ICD-10-CM

## 2014-12-06 DIAGNOSIS — G4733 Obstructive sleep apnea (adult) (pediatric): Secondary | ICD-10-CM

## 2014-12-06 DIAGNOSIS — Z0000 Encounter for general adult medical examination without abnormal findings: Secondary | ICD-10-CM | POA: Diagnosis not present

## 2014-12-06 DIAGNOSIS — E349 Endocrine disorder, unspecified: Secondary | ICD-10-CM

## 2014-12-06 DIAGNOSIS — R7303 Prediabetes: Secondary | ICD-10-CM

## 2014-12-06 LAB — CBC WITH DIFFERENTIAL/PLATELET
BASOS PCT: 1 % (ref 0–1)
Basophils Absolute: 0.1 10*3/uL (ref 0.0–0.1)
Eosinophils Absolute: 0.1 10*3/uL (ref 0.0–0.7)
Eosinophils Relative: 1 % (ref 0–5)
HCT: 51.2 % (ref 39.0–52.0)
HEMOGLOBIN: 18.2 g/dL — AB (ref 13.0–17.0)
LYMPHS ABS: 1.3 10*3/uL (ref 0.7–4.0)
Lymphocytes Relative: 14 % (ref 12–46)
MCH: 32.4 pg (ref 26.0–34.0)
MCHC: 35.5 g/dL (ref 30.0–36.0)
MCV: 91.1 fL (ref 78.0–100.0)
MONO ABS: 1.2 10*3/uL — AB (ref 0.1–1.0)
MONOS PCT: 13 % — AB (ref 3–12)
MPV: 11.2 fL (ref 8.6–12.4)
NEUTROS ABS: 6.8 10*3/uL (ref 1.7–7.7)
NEUTROS PCT: 71 % (ref 43–77)
Platelets: 210 10*3/uL (ref 150–400)
RBC: 5.62 MIL/uL (ref 4.22–5.81)
RDW: 13.5 % (ref 11.5–15.5)
WBC: 9.6 10*3/uL (ref 4.0–10.5)

## 2014-12-06 LAB — LIPID PANEL
CHOL/HDL RATIO: 2.6 ratio (ref <=5.0)
CHOLESTEROL: 150 mg/dL (ref 125–200)
HDL: 58 mg/dL (ref >=40)
LDL Cholesterol: 77 mg/dL (ref <130)
Triglycerides: 73 mg/dL (ref <150)
VLDL: 15 mg/dL (ref <30)

## 2014-12-06 LAB — BASIC METABOLIC PANEL WITH GFR
BUN: 11 mg/dL (ref 7–25)
CALCIUM: 9.2 mg/dL (ref 8.6–10.3)
CHLORIDE: 97 mmol/L — AB (ref 98–110)
CO2: 29 mmol/L (ref 20–31)
CREATININE: 1.11 mg/dL (ref 0.70–1.33)
GFR, Est African American: 85 mL/min (ref >=60)
GFR, Est Non African American: 74 mL/min (ref >=60)
GLUCOSE: 91 mg/dL (ref 65–99)
Potassium: 4.2 mmol/L (ref 3.5–5.3)
Sodium: 138 mmol/L (ref 135–146)

## 2014-12-06 LAB — IRON AND TIBC
%SAT: 25 % (ref 15–60)
IRON: 94 ug/dL (ref 50–180)
TIBC: 375 ug/dL (ref 250–425)
UIBC: 281 ug/dL (ref 125–400)

## 2014-12-06 LAB — VITAMIN B12: Vitamin B-12: 558 pg/mL (ref 211–911)

## 2014-12-06 LAB — HEPATIC FUNCTION PANEL
ALBUMIN: 4.7 g/dL (ref 3.6–5.1)
ALT: 28 U/L (ref 9–46)
AST: 20 U/L (ref 10–35)
Alkaline Phosphatase: 47 U/L (ref 40–115)
Bilirubin, Direct: 0.2 mg/dL (ref <=0.2)
Indirect Bilirubin: 0.8 mg/dL (ref 0.2–1.2)
TOTAL PROTEIN: 7.1 g/dL (ref 6.1–8.1)
Total Bilirubin: 1 mg/dL (ref 0.2–1.2)

## 2014-12-06 LAB — MAGNESIUM: MAGNESIUM: 2 mg/dL (ref 1.5–2.5)

## 2014-12-06 LAB — TSH: TSH: 1.079 u[IU]/mL (ref 0.350–4.500)

## 2014-12-06 MED ORDER — BENAZEPRIL HCL 40 MG PO TABS: ORAL_TABLET | ORAL | Status: DC
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
## 2014-12-06 NOTE — Patient Instructions (Signed)
Hypertension As your heart beats, it forces blood through your arteries. This force is your blood pressure. If the pressure is too high, it is called hypertension (HTN) or high blood pressure. HTN is dangerous because you may have it and not know it. High blood pressure may mean that your heart has to work harder to pump blood. Your arteries may be narrow or stiff. The extra work puts you at risk for heart disease, stroke, and other problems.  Blood pressure consists of two numbers, a higher number over a lower, 110/72, for example. It is stated as "110 over 72." The ideal is below 120 for the top number (systolic) and under 80 for the bottom (diastolic). Write down your blood pressure today. You should pay close attention to your blood pressure if you have certain conditions such as:  Heart failure.  Prior heart attack.  Diabetes  Chronic kidney disease.  Prior stroke.  Multiple risk factors for heart disease. To see if you have HTN, your blood pressure should be measured while you are seated with your arm held at the level of the heart. It should be measured at least twice. A one-time elevated blood pressure reading (especially in the Emergency Department) does not mean that you need treatment. There may be conditions in which the blood pressure is different between your right and left arms. It is important to see your caregiver soon for a recheck. Most people have essential hypertension which means that there is not a specific cause. This type of high blood pressure may be lowered by changing lifestyle factors such as:  Stress.  Smoking.  Lack of exercise.  Excessive weight.  Drug/tobacco/alcohol use.  Eating less salt. Most people do not have symptoms from high blood pressure until it has caused damage to the body. Effective treatment can often prevent, delay or reduce that damage. TREATMENT  When a cause has been identified, treatment for high blood pressure is directed at the  cause. There are a large number of medications to treat HTN. These fall into several categories, and your caregiver will help you select the medicines that are best for you. Medications may have side effects. You should review side effects with your caregiver. If your blood pressure stays high after you have made lifestyle changes or started on medicines,   Your medication(s) may need to be changed.  Other problems may need to be addressed.  Be certain you understand your prescriptions, and know how and when to take your medicine.  Be sure to follow up with your caregiver within the time frame advised (usually within two weeks) to have your blood pressure rechecked and to review your medications.  If you are taking more than one medicine to lower your blood pressure, make sure you know how and at what times they should be taken. Taking two medicines at the same time can result in blood pressure that is too low. SEEK IMMEDIATE MEDICAL CARE IF:  You develop a severe headache, blurred or changing vision, or confusion.  You have unusual weakness or numbness, or a faint feeling.  You have severe chest or abdominal pain, vomiting, or breathing problems. MAKE SURE YOU:   Understand these instructions.  Will watch your condition.  Will get help right away if you are not doing well or get worse.  Diabetes and Exercise Exercising regularly is important. It is not just about losing weight. It has many health benefits, such as:  Improving your overall fitness, flexibility, and endurance.  Increasing your bone density.  Helping with weight control.  Decreasing your body fat.  Increasing your muscle strength.  Reducing stress and tension.  Improving your overall health. People with diabetes who exercise gain additional benefits because exercise:  Reduces appetite.  Improves the body's use of blood sugar (glucose).  Helps lower or control blood glucose.  Decreases blood  pressure.  Helps control blood lipids (such as cholesterol and triglycerides).  Improves the body's use of the hormone insulin by:  Increasing the body's insulin sensitivity.  Reducing the body's insulin needs.  Decreases the risk for heart disease because exercising:  Lowers cholesterol and triglycerides levels.  Increases the levels of good cholesterol (such as high-density lipoproteins [HDL]) in the body.  Lowers blood glucose levels. YOUR ACTIVITY PLAN  Choose an activity that you enjoy and set realistic goals. Your health care provider or diabetes educator can help you make an activity plan that works for you. You can break activities into 2 or 3 sessions throughout the day. Doing so is as good as one long session. Exercise ideas include:  Taking the dog for a walk.  Taking the stairs instead of the elevator.  Dancing to your favorite song.  Doing your favorite exercise with a friend. RECOMMENDATIONS FOR EXERCISING WITH TYPE 1 OR TYPE 2 DIABETES   Check your blood glucose before exercising. If blood glucose levels are greater than 240 mg/dL, check for urine ketones. Do not exercise if ketones are present.  Avoid injecting insulin into areas of the body that are going to be exercised. For example, avoid injecting insulin into:  The arms when playing tennis.  The legs when jogging.  Keep a record of:  Food intake before and after you exercise.  Expected peak times of insulin action.  Blood glucose levels before and after you exercise.  The type and amount of exercise you have done.  Review your records with your health care provider. Your health care provider will help you to develop guidelines for adjusting food intake and insulin amounts before and after exercising.  If you take insulin or oral hypoglycemic agents, watch for signs and symptoms of hypoglycemia. They include:  Dizziness.  Shaking.  Sweating.  Chills.  Confusion.  Drink plenty of water  while you exercise to prevent dehydration or heat stroke. Body water is lost during exercise and must be replaced.  Talk to your health care provider before starting an exercise program to make sure it is safe for you. Remember, almost any type of activity is better than none.  Cholesterol Cholesterol is a white, waxy, fat-like protein needed by your body in small amounts. The liver makes all the cholesterol you need. It is carried from the liver by the blood through the blood vessels. Deposits (plaque) may build up on blood vessel walls. This makes the arteries narrower and stiffer. Plaque increases the risk for heart attack and stroke. You cannot feel your cholesterol level even if it is very high. The only way to know is by a blood test to check your lipid (fats) levels. Once you know your cholesterol levels, you should keep a record of the test results. Work with your caregiver to to keep your levels in the desired range. WHAT THE RESULTS MEAN:  Total cholesterol is a rough measure of all the cholesterol in your blood.  LDL is the so-called bad cholesterol. This is the type that deposits cholesterol in the walls of the arteries. You want this level to be low.  HDL is the good cholesterol because it cleans the arteries and carries the LDL away. You want this level to be high.  Triglycerides are fat that the body can either burn for energy or store. High levels are closely linked to heart disease. DESIRED LEVELS:  Total cholesterol below 200.  LDL below 100 for people at risk, below 70 for very high risk.  HDL above 50 is good, above 60 is best.  Triglycerides below 150. HOW TO LOWER YOUR CHOLESTEROL:  Diet.  Choose fish or white meat chicken and [place], roasted or baked. Limit fatty cuts of red meat, fried foods, and processed meats, such as sausage and lunch meat.  Eat lots of fresh fruits and vegetables. Choose whole grains, beans, pasta, potatoes and cereals.  Use only small  amounts of olive, corn or canola oils. Avoid butter, mayonnaise, shortening or palm kernel oils. Avoid foods with trans-fats.  Use skim/nonfat milk and low-fat/nonfat yogurt and cheeses. Avoid whole milk, cream, ice cream, egg yolks and cheeses. Healthy desserts include angel food cake, ginger snaps, animal crackers, hard candy, popsicles, and low-fat/nonfat frozen yogurt. Avoid pastries, cakes, pies and cookies.  Exercise.  A regular program helps decrease LDL and raises HDL.  Helps with weight control.  Do things that increase your activity level like gardening, walking, or taking the stairs.  Medication.  May be prescribed by your caregiver to help lowering cholesterol and the risk for heart disease.  You may need medicine even if your levels are normal if you have several risk factors. HOME CARE INSTRUCTIONS   Follow your diet and exercise programs as suggested by your caregiver.  Take medications as directed.  Have blood work done when your caregiver feels it is necessary. MAKE SURE YOU:   Understand these instructions.  Will watch your condition.  Will get help right away if you are not doing well or get worse.  Vitamin D Deficiency Vitamin D is an important vitamin that your body needs. Having too little of it in your body is called a deficiency. A very bad deficiency can make your bones soft and can cause a condition called rickets.  Vitamin D is important to your body for different reasons, such as:   It helps your body absorb 2 minerals called calcium and phosphorus.  It helps make your bones healthy.  It may prevent some diseases, such as diabetes and multiple sclerosis.  It helps your muscles and heart. You can get vitamin D in several ways. It is a natural part of some foods. The vitamin is also added to some dairy products and cereals. Some people take vitamin D supplements. Also, your body makes vitamin D when you are in the sun. It changes the sun's rays  into a form of the vitamin that your body can use. CAUSES   Not eating enough foods that contain vitamin D.  Not getting enough sunlight.  Having certain digestive system diseases that make it hard to absorb vitamin D. These diseases include Crohn's disease, chronic pancreatitis, and cystic fibrosis.  Having a surgery in which part of the stomach or small intestine is removed.  Being obese. Fat cells pull vitamin D out of your blood. That means that obese people may not have enough vitamin D left in their blood and in other body tissues.  Having chronic kidney or liver disease. RISK FACTORS Risk factors are things that make you more likely to develop a vitamin D deficiency. They include:  Being older.  Not  being able to get outside very much.  Living in a nursing home.  Having had broken bones.  Having weak or thin bones (osteoporosis).  Having a disease or condition that changes how your body absorbs vitamin D.  Having dark skin.  Some medicines such as seizure medicines or steroids.  Being overweight or obese. SYMPTOMS Mild cases of vitamin D deficiency may not have any symptoms. If you have a very bad case, symptoms may include:  Bone pain.  Muscle pain.  Falling often.  Broken bones caused by a minor injury, due to osteoporosis. DIAGNOSIS A blood test is the best way to tell if you have a vitamin D deficiency. TREATMENT Vitamin D deficiency can be treated in different ways. Treatment for vitamin D deficiency depends on what is causing it. Options include:  Taking vitamin D supplements.  Taking a calcium supplement. Your caregiver will suggest what dose is best for you. HOME CARE INSTRUCTIONS  Take any supplements that your caregiver prescribes. Follow the directions carefully. Take only the suggested amount.  Have your blood tested 2 months after you start taking supplements.  Eat foods that contain vitamin D. Healthy choices include:  Fortified dairy  products, cereals, or juices. Fortified means vitamin D has been added to the food. Check the label on the package to be sure.  Fatty fish like salmon or trout.  Eggs.  Oysters.  Do not use a tanning bed.  Keep your weight at a healthy level. Lose weight if you need to.  Keep all follow-up appointments. Your caregiver will need to perform blood tests to make sure your vitamin D deficiency is going away. SEEK MEDICAL CARE IF:  You have any questions about your treatment.  You continue to have symptoms of vitamin D deficiency.  You have nausea or vomiting.  You are constipated.  You feel confused.  You have severe abdominal or back pain. MAKE SURE YOU:  Understand these instructions.  Will watch your condition.  Will get help right away if you are not doing well or get worse.  Preventive Care for Adults  A healthy lifestyle and preventive care can promote health and wellness. Preventive health guidelines for men include the following key practices: A routine yearly physical is a good way to check with your health care provider about your health and preventative screening. It is a chance to share any concerns and updates on your health and to receive a thorough exam. Visit your dentist for a routine exam and preventative care every 6 months. Brush your teeth twice a day and floss once a day. Good oral hygiene prevents tooth decay and gum disease. The frequency of eye exams is based on your age, health, family medical history, use of contact lenses, and other factors. Follow your health care provider's recommendations for frequency of eye exams. Eat a healthy diet. Foods such as vegetables, fruits, whole grains, low-fat dairy products, and lean protein foods contain the nutrients you need without too many calories. Decrease your intake of foods high in solid fats, added sugars, and salt. Eat the right amount of calories for you.Get information about a proper diet from your health  care provider, if necessary. Regular physical exercise is one of the most important things you can do for your health. Most adults should get at least 150 minutes of moderate-intensity exercise (any activity that increases your heart rate and causes you to sweat) each week. In addition, most adults need muscle-strengthening exercises on 2 or more days  a week. Maintain a healthy weight. The body mass index (BMI) is a screening tool to identify possible weight problems. It provides an estimate of body fat based on height and weight. Your health care provider can find your BMI and can help you achieve or maintain a healthy weight.For adults 20 years and older: A BMI below 18.5 is considered underweight. A BMI of 18.5 to 24.9 is normal. A BMI of 25 to 29.9 is considered overweight. A BMI of 30 and above is considered obese. Maintain normal blood lipids and cholesterol levels by exercising and minimizing your intake of saturated fat. Eat a balanced diet with plenty of fruit and vegetables. Blood tests for lipids and cholesterol should begin at age 87 and be repeated every 5 years. If your lipid or cholesterol levels are high, you are over 50, or you are at high risk for heart disease, you may need your cholesterol levels checked more frequently.Ongoing high lipid and cholesterol levels should be treated with medicines if diet and exercise are not working. If you smoke, find out from your health care provider how to quit. If you do not use tobacco, do not start. Lung cancer screening is recommended for adults aged 12-80 years who are at high risk for developing lung cancer because of a history of smoking. A yearly low-dose CT scan of the lungs is recommended for people who have at least a 30-pack-year history of smoking and are a current smoker or have quit within the past 15 years. A pack year of smoking is smoking an average of 1 pack of cigarettes a day for 1 year (for example: 1 pack a day for 30 years or 2  packs a day for 15 years). Yearly screening should continue until the smoker has stopped smoking for at least 15 years. Yearly screening should be stopped for people who develop a health problem that would prevent them from having lung cancer treatment. If you choose to drink alcohol, do not have more than 2 drinks per day. One drink is considered to be 12 ounces (355 mL) of beer, 5 ounces (148 mL) of wine, or 1.5 ounces (44 mL) of liquor. Avoid use of street drugs. Do not share needles with anyone. Ask for help if you need support or instructions about stopping the use of drugs. High blood pressure causes heart disease and increases the risk of stroke. Your blood pressure should be checked at least every 1-2 years. Ongoing high blood pressure should be treated with medicines, if weight loss and exercise are not effective. If you are 99-29 years old, ask your health care provider if you should take aspirin to prevent heart disease. Diabetes screening involves taking a blood sample to check your fasting blood sugar level. This should be done once every 3 years, after age 43, if you are within normal weight and without risk factors for diabetes. Testing should be considered at a younger age or be carried out more frequently if you are overweight and have at least 1 risk factor for diabetes. Colorectal cancer can be detected and often prevented. Most routine colorectal cancer screening begins at the age of 16 and continues through age 31. However, your health care provider may recommend screening at an earlier age if you have risk factors for colon cancer. On a yearly basis, your health care provider may provide home test kits to check for hidden blood in the stool. Use of a small camera at the end of a tube to directly  examine the colon (sigmoidoscopy or colonoscopy) can detect the earliest forms of colorectal cancer. Talk to your health care provider about this at age 14, when routine screening begins. Direct  exam of the colon should be repeated every 5-10 years through age 25, unless early forms of precancerous polyps or small growths are found.  Talk with your health care provider about prostate cancer screening. Testicular cancer screening isrecommended for adult males. Screening includes self-exam, a health care provider exam, and other screening tests. Consult with your health care provider about any symptoms you have or any concerns you have about testicular cancer. Use sunscreen. Apply sunscreen liberally and repeatedly throughout the day. You should seek shade when your shadow is shorter than you. Protect yourself by wearing long sleeves, pants, a wide-brimmed hat, and sunglasses year round, whenever you are outdoors. Once a month, do a whole-body skin exam, using a mirror to look at the skin on your back. Tell your health care provider about new moles, moles that have irregular borders, moles that are larger than a pencil eraser, or moles that have changed in shape or color. Stay current with required vaccines (immunizations). Influenza vaccine. All adults should be immunized every year. Tetanus, diphtheria, and acellular pertussis (Td, Tdap) vaccine. An adult who has not previously received Tdap or who does not know his vaccine status should receive 1 dose of Tdap. This initial dose should be followed by tetanus and diphtheria toxoids (Td) booster doses every 10 years. Adults with an unknown or incomplete history of completing a 3-dose immunization series with Td-containing vaccines should begin or complete a primary immunization series including a Tdap dose. Adults should receive a Td booster every 10 years. Varicella vaccine. An adult without evidence of immunity to varicella should receive 2 doses or a second dose if he has previously received 1 dose. Human papillomavirus (HPV) vaccine. Males aged 73-21 years who have not received the vaccine previously should receive the 3-dose series. Males aged  22-26 years may be immunized. Immunization is recommended through the age of 90 years for any male who has sex with males and did not get any or all doses earlier. Immunization is recommended for any person with an immunocompromised condition through the age of 22 years if he did not get any or all doses earlier. During the 3-dose series, the second dose should be obtained 4-8 weeks after the first dose. The third dose should be obtained 24 weeks after the first dose and 16 weeks after the second dose. Zoster vaccine. One dose is recommended for adults aged 75 years or older unless certain conditions are present.  PREVNAR  - Pneumococcal 13-valent conjugate (PCV13) vaccine. When indicated, a person who is uncertain of his immunization history and has no record of immunization should receive the PCV13 vaccine. An adult aged 32 years or older who has certain medical conditions and has not been previously immunized should receive 1 dose of PCV13 vaccine. This PCV13 should be followed with a dose of pneumococcal polysaccharide (PPSV23) vaccine. The PPSV23 vaccine dose should be obtained at least 8 weeks after the dose of PCV13 vaccine. An adult aged 20 years or older who has certain medical conditions and previously received 1 or more doses of PPSV23 vaccine should receive 1 dose of PCV13. The PCV13 vaccine dose should be obtained 1 or more years after the last PPSV23 vaccine dose.  PNEUMOVAX - Pneumococcal polysaccharide (PPSV23) vaccine. When PCV13 is also indicated, PCV13 should be obtained first. All adults aged  65 years and older should be immunized. An adult younger than age 75 years who has certain medical conditions should be immunized. Any person who resides in a nursing home or long-term care facility should be immunized. An adult smoker should be immunized. People with an immunocompromised condition and certain other conditions should receive both PCV13 and PPSV23 vaccines. People with human  immunodeficiency virus (HIV) infection should be immunized as soon as possible after diagnosis. Immunization during chemotherapy or radiation therapy should be avoided. Routine use of PPSV23 vaccine is not recommended for American Indians, [place] Natives, or people younger than 65 years unless there are medical conditions that require PPSV23 vaccine. When indicated, people who have unknown immunization and have no record of immunization should receive PPSV23 vaccine. One-time revaccination 5 years after the first dose of PPSV23 is recommended for people aged 19-64 years who have chronic kidney failure, nephrotic syndrome, asplenia, or immunocompromised conditions. People who received 1-2 doses of PPSV23 before age 59 years should receive another dose of PPSV23 vaccine at age 45 years or later if at least 5 years have passed since the previous dose. Doses of PPSV23 are not needed for people immunized with PPSV23 at or after age 49 years.  Hepatitis A vaccine. Adults who wish to be protected from this disease, have certain high-risk conditions, work with hepatitis A-infected animals, work in hepatitis A research labs, or travel to or work in countries with a high rate of hepatitis A should be immunized. Adults who were previously unvaccinated and who anticipate close contact with an international adoptee during the first 60 days after arrival in the [place] from a country with a high rate of hepatitis A should be immunized.  Hepatitis B vaccine. Adults should be immunized if they wish to be protected from this disease, have certain high-risk conditions, may be exposed to blood or other infectious body fluids, are household contacts or sex partners of hepatitis B positive people, are clients or workers in certain care facilities, or travel to or work in countries with a high rate of hepatitis B.  Preventive Service / Frequency  Ages 9 to 42 Blood pressure check. Lipid and cholesterol check Lung cancer  screening. / Every year if you are aged 39-80 years and have a 30-pack-year history of smoking and currently smoke or have quit within the past 15 years. Yearly screening is stopped once you have quit smoking for at least 15 years or develop a health problem that would prevent you from having lung cancer treatment. Fecal occult blood test (FOBT) of stool. / Every year beginning at age 26 and continuing until age 94. You may not have to do this test if you get a colonoscopy every 10 years. Flexible sigmoidoscopy** or colonoscopy.** / Every 5 years for a flexible sigmoidoscopy or every 10 years for a colonoscopy beginning at age 1 and continuing until age 38. Screening for abdominal aortic aneurysm (AAA)  by ultrasound is recommended for people who have history of high blood pressure or who are current or former smokers.

## 2014-12-07 LAB — URINALYSIS, ROUTINE W REFLEX MICROSCOPIC
BILIRUBIN URINE: NEGATIVE
GLUCOSE, UA: NEGATIVE
HGB URINE DIPSTICK: NEGATIVE
Ketones, ur: NEGATIVE
LEUKOCYTES UA: NEGATIVE
Nitrite: NEGATIVE
PH: 7.5 (ref 5.0–8.0)
Specific Gravity, Urine: 1.01 (ref 1.001–1.035)

## 2014-12-07 LAB — INSULIN, RANDOM: Insulin: 5.1 u[IU]/mL (ref 2.0–19.6)

## 2014-12-07 LAB — TESTOSTERONE: TESTOSTERONE: 1519.78 ng/dL — AB (ref 300–890)

## 2014-12-07 LAB — VITAMIN D 25 HYDROXY (VIT D DEFICIENCY, FRACTURES): VIT D 25 HYDROXY: 58 ng/mL (ref 30–100)

## 2014-12-07 LAB — PSA: PSA: 1.23 ng/mL (ref <=4.00)

## 2014-12-07 LAB — MICROALBUMIN / CREATININE URINE RATIO
Creatinine, Urine: 75.8 mg/dL
Microalb Creat Ratio: 98.9 mg/g — ABNORMAL HIGH (ref 0.0–30.0)
Microalb, Ur: 7.5 mg/dL — ABNORMAL HIGH (ref <2.0)

## 2014-12-07 LAB — HEMOGLOBIN A1C
HEMOGLOBIN A1C: 5.9 % — AB (ref <5.7)
MEAN PLASMA GLUCOSE: 123 mg/dL — AB (ref <117)

## 2014-12-11 ENCOUNTER — Other Ambulatory Visit: Payer: Self-pay | Admitting: Internal Medicine

## 2014-12-11 DIAGNOSIS — R0989 Other specified symptoms and signs involving the circulatory and respiratory systems: Secondary | ICD-10-CM

## 2014-12-14 LAB — CATECHOLAMINES, FRACTIONATED, URINE, 24 HOUR
CREATININE, URINE MG/DAY-CATEUR: 2.89 g/(24.h) — AB (ref 0.63–2.50)
Calculated Total (E+NE): 106 mcg/24 h (ref 26–121)
DOPAMINE, 24 HR URINE: 377 ug/(24.h) (ref 52–480)
Epinephrine, 24 hr Urine: 16 mcg/24 h (ref 2–24)
Norepinephrine, 24 hr Ur: 90 mcg/24 h (ref 15–100)
Total Volume - CF 24Hr U: 1600 mL

## 2014-12-15 ENCOUNTER — Other Ambulatory Visit: Payer: Self-pay | Admitting: *Deleted

## 2014-12-15 DIAGNOSIS — Z1212 Encounter for screening for malignant neoplasm of rectum: Secondary | ICD-10-CM

## 2014-12-15 DIAGNOSIS — Z0001 Encounter for general adult medical examination with abnormal findings: Secondary | ICD-10-CM

## 2014-12-15 LAB — POC HEMOCCULT BLD/STL (HOME/3-CARD/SCREEN)
Card #3 Fecal Occult Blood, POC: NEGATIVE
FECAL OCCULT BLD: NEGATIVE
Fecal Occult Blood, POC: POSITIVE — AB

## 2014-12-15 LAB — VMA, URINE, 24 HOUR
CREATININE 24H URINE: 2.89 g/(24.h) — AB (ref 0.63–2.50)
VANILLYLMANDELIC ACID, (VMA): 6.9 mg/(24.h) — AB (ref <=6.0)

## 2014-12-16 ENCOUNTER — Other Ambulatory Visit: Payer: Self-pay | Admitting: Internal Medicine

## 2014-12-16 LAB — 5 HIAA, QUANTITATIVE, URINE, 24 HOUR: 5-HIAA, 24 Hr Urine: 5 mg/24 h (ref <=6.0)

## 2014-12-18 ENCOUNTER — Other Ambulatory Visit: Payer: Self-pay | Admitting: Internal Medicine

## 2014-12-18 DIAGNOSIS — I10 Essential (primary) hypertension: Secondary | ICD-10-CM

## 2014-12-20 ENCOUNTER — Other Ambulatory Visit: Payer: Self-pay | Admitting: Internal Medicine

## 2014-12-20 DIAGNOSIS — C7410 Malignant neoplasm of medulla of unspecified adrenal gland: Secondary | ICD-10-CM

## 2014-12-20 DIAGNOSIS — I10 Essential (primary) hypertension: Secondary | ICD-10-CM

## 2014-12-27 ENCOUNTER — Ambulatory Visit (HOSPITAL_COMMUNITY)
Admission: RE | Admit: 2014-12-27 | Discharge: 2014-12-27 | Disposition: A | Discharge status: Home / Self Care | Payer: BLUE CROSS/BLUE SHIELD | Source: Ambulatory Visit | Attending: Internal Medicine | Admitting: Internal Medicine

## 2014-12-27 ENCOUNTER — Other Ambulatory Visit: Payer: Self-pay | Admitting: Internal Medicine

## 2014-12-27 ENCOUNTER — Ambulatory Visit (HOSPITAL_COMMUNITY): Payer: BLUE CROSS/BLUE SHIELD

## 2014-12-27 ENCOUNTER — Encounter (HOSPITAL_COMMUNITY): Payer: Self-pay

## 2014-12-27 DIAGNOSIS — R232 Flushing: Secondary | ICD-10-CM | POA: Diagnosis not present

## 2014-12-27 DIAGNOSIS — R918 Other nonspecific abnormal finding of lung field: Secondary | ICD-10-CM | POA: Insufficient documentation

## 2014-12-27 DIAGNOSIS — K5730 Diverticulosis of large intestine without perforation or abscess without bleeding: Secondary | ICD-10-CM | POA: Insufficient documentation

## 2014-12-27 DIAGNOSIS — N281 Cyst of kidney, acquired: Secondary | ICD-10-CM | POA: Diagnosis not present

## 2014-12-27 DIAGNOSIS — N289 Disorder of kidney and ureter, unspecified: Secondary | ICD-10-CM | POA: Insufficient documentation

## 2014-12-27 DIAGNOSIS — I10 Essential (primary) hypertension: Secondary | ICD-10-CM | POA: Insufficient documentation

## 2014-12-27 DIAGNOSIS — I2510 Atherosclerotic heart disease of native coronary artery without angina pectoris: Secondary | ICD-10-CM | POA: Diagnosis not present

## 2014-12-27 DIAGNOSIS — C7410 Malignant neoplasm of medulla of unspecified adrenal gland: Secondary | ICD-10-CM

## 2014-12-27 DIAGNOSIS — K760 Fatty (change of) liver, not elsewhere classified: Secondary | ICD-10-CM | POA: Diagnosis not present

## 2014-12-27 MED ORDER — IOHEXOL 300 MG/ML  SOLN
100.0000 mL | Freq: Once | INTRAMUSCULAR | Status: AC | PRN
  Administered 2014-12-27: 100 mL via INTRAVENOUS

## 2015-01-08 ENCOUNTER — Encounter: Payer: Self-pay | Admitting: Internal Medicine

## 2015-01-08 ENCOUNTER — Ambulatory Visit (INDEPENDENT_AMBULATORY_CARE_PROVIDER_SITE_OTHER): Payer: BLUE CROSS/BLUE SHIELD | Admitting: Internal Medicine

## 2015-01-08 VITALS — BP 158/88 | HR 60 | Temp 98.2°F | Resp 18 | Ht 65.0 in | Wt 210.0 lb

## 2015-01-08 DIAGNOSIS — M2550 Pain in unspecified joint: Secondary | ICD-10-CM

## 2015-01-08 DIAGNOSIS — I10 Essential (primary) hypertension: Secondary | ICD-10-CM

## 2015-01-08 DIAGNOSIS — G8929 Other chronic pain: Secondary | ICD-10-CM

## 2015-01-08 DIAGNOSIS — Z6835 Body mass index (BMI) 35.0-35.9, adult: Secondary | ICD-10-CM | POA: Diagnosis not present

## 2015-01-08 DIAGNOSIS — R7303 Prediabetes: Secondary | ICD-10-CM

## 2015-01-08 DIAGNOSIS — R0989 Other specified symptoms and signs involving the circulatory and respiratory systems: Secondary | ICD-10-CM

## 2015-01-08 MED ORDER — MINOXIDIL 10 MG PO TABS: ORAL_TABLET | ORAL | Status: DC
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
## 2015-01-23 ENCOUNTER — Other Ambulatory Visit: Payer: Self-pay | Admitting: Internal Medicine

## 2015-01-23 DIAGNOSIS — J209 Acute bronchitis, unspecified: Secondary | ICD-10-CM

## 2015-01-23 MED ORDER — AZITHROMYCIN 250 MG PO TABS: ORAL_TABLET | ORAL | Status: DC

## 2015-01-23 MED ORDER — PREDNISONE 20 MG PO TABS: ORAL_TABLET | ORAL | Status: DC

## 2015-02-08 ENCOUNTER — Encounter: Payer: Self-pay | Admitting: Internal Medicine

## 2015-02-08 ENCOUNTER — Ambulatory Visit (INDEPENDENT_AMBULATORY_CARE_PROVIDER_SITE_OTHER): Payer: BLUE CROSS/BLUE SHIELD | Admitting: Internal Medicine

## 2015-02-08 VITALS — BP 148/80 | HR 72 | Temp 97.7°F | Resp 16 | Ht 65.0 in | Wt 209.4 lb

## 2015-02-08 DIAGNOSIS — R0989 Other specified symptoms and signs involving the circulatory and respiratory systems: Secondary | ICD-10-CM

## 2015-02-08 DIAGNOSIS — I10 Essential (primary) hypertension: Secondary | ICD-10-CM

## 2015-02-08 DIAGNOSIS — Z6835 Body mass index (BMI) 35.0-35.9, adult: Secondary | ICD-10-CM

## 2015-02-10 ENCOUNTER — Encounter: Payer: Self-pay | Admitting: Internal Medicine

## 2015-02-10 NOTE — Progress Notes (Signed)
   Subjective:    Patient ID: [name], male    DOB: 07-13-1960, 54 y.o.   MRN: [id number]  HPI Patient returns for 1 month f/u of elevated BP after adding Minoxidil 5 mg daily with some improvement with both Sys and dias down about 10 points , but still elevated at 158/88 today and he reports in the interim since last OV that BPs have range betw 140-160 sys and 70-90 dias and surprisingly he has been relatively asx.   Medication Sig  . atorvastatin  80 MG tablet take 1 tablet by mouth once daily for cholesterol  . benazepril  40 MG tablet Take 1 tablet daily at nite for BP  . bisoprolol-hctz (ZIAC) 10-6.25 MG  take 1 tablet by mouth once daily for blood pressure  . clonazePAM  1 MG tablet   . COLACE 100 MG capsule Take 100 mg by mouth daily.  Marland Kitchen HYDROmorphone (DILAUDID) 8 MG tablet 8 mg every 6 (six) hours as needed.   Marland Kitchen HYDROmorphone HCl 16 MG T24A Take 1 tablet by mouth daily.  Marland Kitchen HYDROmorphone HCl 32 MG T24A Take 1 tablet by mouth daily.  . minoxidil (LONITEN) 10 MG tablet Take 1/2 to 1 tablet daily as directed for BP  . oxymorphone (OPANA) 10 MG tablet   . DEPO-TESTOSTERONE  200 MG/ML inj inject 2 milliliters intramuscularly every 2 weeks  . VITAMIN D, CHOLECALCIFEROL, PO Take 10,000 mg by mouth daily.  . VOLTAREN 1 % GEL Apply 2 g topically Daily.    Allergies  Allergen Reactions  . Penicillins Anaphylaxis  . Ace Inhibitors Other (See Comments)    unknown  . Keflex [Cephalexin] Hives   Past Medical History  Diagnosis Date  . Diverticulitis   . Chronic pain of left knee   . Hyperlipidemia   . Labile hypertension   . Anxiety   . Prediabetes   . IBS (irritable bowel syndrome)   . Other testicular hypofunction   . Allergy   . Vitamin D deficiency   . Sleep apnea     uses CPAP  . Kidney stones   . Arthritis    Review of Systems 10 point systems review negative except as above.     Objective:   Physical Exam  BP 148/80 mmHg  Pulse 72  Temp(Src) 97.7 F (36.5 C)   Resp 16  Ht 5\' 5"  (1.651 m)  Wt 209 lb 6.4 oz (94.983 kg)  BMI 34.85 kg/m2  HEENT - Eac's patent. TM's Nl. EOM's full. PERRLA. NasoOroPharynx clear. Neck - supple. Nl Thyroid. Carotids 2+ & No bruits, nodes, JVD Chest - Clear equal BS w/o Rales, rhonchi, wheezes. Cor - Nl HS. RRR w/o sig MGR. PP 1(+). No edema. Abd - No palpable organomegaly, masses or tenderness. BS nl. MS- FROM w/o deformities. Muscle power, tone and bulk Nl. Gait Nl. Neuro - No obvious Cr N abnormalities. Sensory, motor and Cerebellar functions appear Nl w/o focal abnormalities. Psyche - Mental status normal & appropriate.  No delusions, ideations or obvious mood abnormalities.    Assessment & Plan:   1. Hypertension  - recc increase minoxidil to 10 mg daily , continue freq BP monitoring and ROV 1 mo . Discussed dash diet, continued exercise and meds/SE's.

## 2015-02-12 ENCOUNTER — Other Ambulatory Visit: Payer: Self-pay | Admitting: Internal Medicine

## 2015-02-12 DIAGNOSIS — F411 Generalized anxiety disorder: Secondary | ICD-10-CM

## 2015-03-13 ENCOUNTER — Other Ambulatory Visit: Payer: Self-pay | Admitting: *Deleted

## 2015-03-13 ENCOUNTER — Encounter: Payer: Self-pay | Admitting: Internal Medicine

## 2015-03-13 ENCOUNTER — Ambulatory Visit (INDEPENDENT_AMBULATORY_CARE_PROVIDER_SITE_OTHER): Payer: BLUE CROSS/BLUE SHIELD | Admitting: Internal Medicine

## 2015-03-13 VITALS — BP 134/82 | HR 64 | Temp 97.9°F | Resp 16 | Ht 65.0 in | Wt 217.4 lb

## 2015-03-13 DIAGNOSIS — R7303 Prediabetes: Secondary | ICD-10-CM | POA: Diagnosis not present

## 2015-03-13 DIAGNOSIS — E559 Vitamin D deficiency, unspecified: Secondary | ICD-10-CM | POA: Diagnosis not present

## 2015-03-13 DIAGNOSIS — E291 Testicular hypofunction: Secondary | ICD-10-CM

## 2015-03-13 DIAGNOSIS — E785 Hyperlipidemia, unspecified: Secondary | ICD-10-CM

## 2015-03-13 DIAGNOSIS — I10 Essential (primary) hypertension: Secondary | ICD-10-CM

## 2015-03-13 DIAGNOSIS — E349 Endocrine disorder, unspecified: Secondary | ICD-10-CM

## 2015-03-13 DIAGNOSIS — R0989 Other specified symptoms and signs involving the circulatory and respiratory systems: Secondary | ICD-10-CM

## 2015-03-13 DIAGNOSIS — Z79899 Other long term (current) drug therapy: Secondary | ICD-10-CM | POA: Diagnosis not present

## 2015-03-13 LAB — CBC WITH DIFFERENTIAL/PLATELET
Basophils Absolute: 0.1 10*3/uL (ref 0.0–0.1)
Basophils Relative: 1 % (ref 0–1)
EOS ABS: 0.2 10*3/uL (ref 0.0–0.7)
Eosinophils Relative: 3 % (ref 0–5)
HCT: 45.5 % (ref 39.0–52.0)
HEMOGLOBIN: 15.7 g/dL (ref 13.0–17.0)
LYMPHS ABS: 1.4 10*3/uL (ref 0.7–4.0)
Lymphocytes Relative: 22 % (ref 12–46)
MCH: 31.5 pg (ref 26.0–34.0)
MCHC: 34.5 g/dL (ref 30.0–36.0)
MCV: 91.2 fL (ref 78.0–100.0)
MONO ABS: 0.8 10*3/uL (ref 0.1–1.0)
MONOS PCT: 13 % — AB (ref 3–12)
MPV: 10.8 fL (ref 8.6–12.4)
NEUTROS ABS: 3.8 10*3/uL (ref 1.7–7.7)
Neutrophils Relative %: 61 % (ref 43–77)
Platelets: 202 10*3/uL (ref 150–400)
RBC: 4.99 MIL/uL (ref 4.22–5.81)
RDW: 13.2 % (ref 11.5–15.5)
WBC: 6.2 10*3/uL (ref 4.0–10.5)

## 2015-03-13 LAB — HEPATIC FUNCTION PANEL
ALT: 21 U/L (ref 9–46)
AST: 24 U/L (ref 10–35)
Albumin: 4.4 g/dL (ref 3.6–5.1)
Alkaline Phosphatase: 42 U/L (ref 40–115)
BILIRUBIN DIRECT: 0.2 mg/dL (ref <=0.2)
BILIRUBIN INDIRECT: 0.5 mg/dL (ref 0.2–1.2)
TOTAL PROTEIN: 6.7 g/dL (ref 6.1–8.1)
Total Bilirubin: 0.7 mg/dL (ref 0.2–1.2)

## 2015-03-13 LAB — BASIC METABOLIC PANEL WITH GFR
BUN: 15 mg/dL (ref 7–25)
CHLORIDE: 98 mmol/L (ref 98–110)
CO2: 31 mmol/L (ref 20–31)
CREATININE: 0.96 mg/dL (ref 0.70–1.33)
Calcium: 9.5 mg/dL (ref 8.6–10.3)
GFR, Est African American: >89 mL/min (ref >=60)
GFR, Est Non African American: 88 mL/min (ref >=60)
GLUCOSE: 93 mg/dL (ref 65–99)
Potassium: 4.3 mmol/L (ref 3.5–5.3)
Sodium: 138 mmol/L (ref 135–146)

## 2015-03-13 LAB — MAGNESIUM: Magnesium: 2 mg/dL (ref 1.5–2.5)

## 2015-03-13 LAB — LIPID PANEL
CHOL/HDL RATIO: 3.5 ratio (ref <=5.0)
Cholesterol: 170 mg/dL (ref 125–200)
HDL: 49 mg/dL (ref >=40)
LDL Cholesterol: 81 mg/dL (ref <130)
Triglycerides: 198 mg/dL — ABNORMAL HIGH (ref <150)
VLDL: 40 mg/dL — AB (ref <30)

## 2015-03-13 LAB — TSH: TSH: 1.021 u[IU]/mL (ref 0.350–4.500)

## 2015-03-13 MED ORDER — TESTOSTERONE CYPIONATE 200 MG/ML IM SOLN: INTRAMUSCULAR | Status: DC
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
## 2015-03-13 NOTE — Patient Instructions (Signed)
Recommend Adult Low Dose Aspirin or   coated  Aspirin 81 mg daily   To reduce risk of Colon Cancer 20 %,   Skin Cancer 26 % ,   Melanoma 46%   and   Pancreatic cancer 60%   ++++++++++++++++++++++++++++++++++++++++++++++++++++++  Vitamin D goal   is between 70-100.   Please make sure that you are taking your Vitamin D as directed.   It is very important as a natural anti-inflammatory   helping hair, skin, and nails, as well as reducing stroke and heart attack risk.   It helps your bones and helps with mood.  It also decreases numerous cancer risks so please take it as directed.   Low Vit D is associated with a 200-300% higher risk for CANCER   and 200-300% higher risk for HEART   ATTACK  &  STROKE.   ......................................  It is also associated with higher death rate at younger ages,   autoimmune diseases like Rheumatoid arthritis, Lupus, Multiple Sclerosis.     Also many other serious conditions, like depression, Alzheimer's  Dementia, infertility, muscle aches, fatigue, fibromyalgia - just to name a few.  ++++++++++++++++++++++++++++++++++++++++++++++++  Recommend the book "The END of DIETING" by Dr Kijana Fuhrman   & the book "The END of DIABETES " by Dr Tayari Fuhrman  At [employer] - get book & Audio CD's     Being diabetic has a  300% increased risk for heart attack, stroke, cancer, and alzheimer- type vascular dementia. It is very important that you work harder with diet by avoiding all foods that are white. Avoid white rice (brown & wild rice is OK), white potatoes (sweetpotatoes in moderation is OK), White bread or wheat bread or anything made out of white flour like bagels, donuts, rolls, buns, biscuits, cakes, pastries, cookies, pizza crust, and pasta (made from white flour & egg whites) - vegetarian pasta or spinach or wheat pasta is OK. Multigrain breads like Arnold's or Pepperidge Farm, or multigrain sandwich thins or flatbreads.  Diet,  exercise and weight loss can reverse and cure diabetes in the early stages.  Diet, exercise and weight loss is very important in the control and prevention of complications of diabetes which affects every system in your body, ie. Brain - dementia/stroke, eyes - glaucoma/blindness, heart - heart attack/heart failure, kidneys - dialysis, stomach - gastric paralysis, intestines - malabsorption, nerves - severe painful neuritis, circulation - gangrene & loss of a leg(s), and finally cancer and Alzheimers.    I recommend avoid fried & greasy foods,  sweets/candy, white rice (brown or wild rice or Quinoa is OK), white potatoes (sweet potatoes are OK) - anything made from white flour - bagels, doughnuts, rolls, buns, biscuits,white and wheat breads, pizza crust and traditional pasta made of white flour & egg white(vegetarian pasta or spinach or wheat pasta is OK).  Multi-grain bread is OK - like multi-grain flat bread or sandwich thins. Avoid alcohol in excess. Exercise is also important.    Eat all the vegetables you want - avoid meat, especially red meat and dairy - especially cheese.  Cheese is the most concentrated form of trans-fats which is the worst thing to clog up our arteries. Veggie cheese is OK which can be found in the fresh produce section at [employer] or [employer] or [employer]  ++++++++++++++++++++++++++++++++++++++++++++++++++ DASH Eating Plan  DASH stands for "Dietary Approaches to Stop Hypertension."   The DASH eating plan is a healthy eating plan that has been shown to reduce high   blood pressure (hypertension). Additional health benefits may include reducing the risk of type 2 diabetes mellitus, heart disease, and stroke. The DASH eating plan may also help with weight loss.  WHAT DO I NEED TO KNOW ABOUT THE DASH EATING PLAN? For the DASH eating plan, you will follow these general guidelines:  Choose foods with a percent daily value for sodium of less than 5% (as listed on the food  label).  Use salt-free seasonings or herbs instead of table salt or sea salt.  Check with your health care provider or pharmacist before using salt substitutes.  Eat lower-sodium products, often labeled as "lower sodium" or "no salt added."  Eat fresh foods.  Eat more vegetables, fruits, and low-fat dairy products.    Choose whole grains. Look for the word "whole" as the first word in the ingredient list.  Choose fish   Limit sweets, desserts, sugars, and sugary drinks.  Choose heart-healthy fats.  Eat veggie cheese   Eat more home-cooked food and less restaurant, buffet, and fast food.  Limit fried foods.  Cook foods using methods other than frying.  Limit canned vegetables. If you do use them, rinse them well to decrease the sodium.  When eating at a restaurant, ask that your food be prepared with less salt, or no salt if possible.                      WHAT FOODS CAN I EAT?  Seek help from a dietitian for individual calorie needs.  Grains Whole grain or whole wheat bread. Brown rice. Whole grain or whole wheat pasta. Quinoa, bulgur, and whole grain cereals. Low-sodium cereals. Corn or whole wheat flour tortillas. Whole grain cornbread. Whole grain crackers. Low-sodium crackers.  Vegetables Fresh or frozen vegetables (raw, steamed, roasted, or grilled). Low-sodium or reduced-sodium tomato and vegetable juices. Low-sodium or reduced-sodium tomato sauce and paste. Low-sodium or reduced-sodium canned vegetables.   Fruits All fresh, canned (in natural juice), or frozen fruits.  Protein Products  All fish and seafood.  Dried beans, peas, or lentils. Unsalted nuts and seeds. Unsalted canned beans.  Dairy Low-fat dairy products, such as skim or 1% milk, 2% or reduced-fat cheeses, low-fat ricotta or cottage cheese, or plain low-fat yogurt. Low-sodium or reduced-sodium cheeses.  Fats and Oils Tub margarines without trans fats. Light or reduced-fat mayonnaise and salad  dressings (reduced sodium). Avocado. Safflower, olive, or canola oils. Natural peanut or almond butter.  Other Unsalted popcorn and pretzels. The items listed above may not be a complete list of recommended foods or beverages. Contact your dietitian for more options.  +++++++++++++++++++++++++++++++++++++++++++  WHAT FOODS ARE NOT RECOMMENDED?  Grains/ White flour or wheat flour  White bread. White pasta. White rice. Refined cornbread. Bagels and croissants. Crackers that contain trans fat.  Vegetables  Creamed or fried vegetables. Vegetables in a . Regular canned vegetables. Regular canned tomato sauce and paste. Regular tomato and vegetable juices.  Fruits Dried fruits. Canned fruit in light or heavy syrup. Fruit juice.  Meat and Other Protein Products Meat in general. Fatty cuts of meat. Ribs, chicken wings, bacon, sausage, bologna, salami, chitterlings, fatback, hot dogs, bratwurst, and packaged luncheon meats.  Dairy Whole or 2% milk, cream, half-and-half, and cream cheese. Whole-fat or sweetened yogurt. Full-fat cheeses or blue cheese. Nondairy creamers and whipped toppings. Processed cheese, cheese spreads, or cheese curds.  Condiments Onion and garlic salt, seasoned salt, table salt, and sea salt. Canned and packaged gravies. Worcestershire sauce. Tartar sauce.   Barbecue sauce. Teriyaki sauce. Soy sauce, including reduced sodium. Steak sauce. Fish sauce. Oyster sauce. Cocktail sauce. Horseradish. Ketchup and mustard. Meat flavorings and tenderizers. Bouillon cubes. Hot sauce. Tabasco sauce. Marinades. Taco seasonings. Relishes.  Fats and Oils Butter, stick margarine, lard, shortening, ghee, and bacon fat. Coconut, palm kernel, or palm oils. Regular salad dressings.  Pickles and olives. Salted popcorn and pretzels. The items listed above may not be a complete list of foods and beverages to avoid.   

## 2015-03-14 LAB — INSULIN, RANDOM: Insulin: 5.8 u[IU]/mL (ref 2.0–19.6)

## 2015-03-14 LAB — HEMOGLOBIN A1C
HEMOGLOBIN A1C: 5.6 % (ref <5.7)
MEAN PLASMA GLUCOSE: 114 mg/dL (ref <117)

## 2015-03-14 LAB — TESTOSTERONE: Testosterone: 1372 ng/dL — ABNORMAL HIGH (ref 250–827)

## 2015-03-14 LAB — VITAMIN D 25 HYDROXY (VIT D DEFICIENCY, FRACTURES): Vit D, 25-Hydroxy: 50 ng/mL (ref 30–100)

## 2015-06-04 ENCOUNTER — Other Ambulatory Visit: Payer: Self-pay | Admitting: Internal Medicine

## 2015-06-11 DIAGNOSIS — H40033 Anatomical narrow angle, bilateral: Secondary | ICD-10-CM | POA: Diagnosis not present

## 2015-06-12 ENCOUNTER — Encounter: Payer: Self-pay | Admitting: Internal Medicine

## 2015-06-12 ENCOUNTER — Ambulatory Visit (INDEPENDENT_AMBULATORY_CARE_PROVIDER_SITE_OTHER): Payer: BLUE CROSS/BLUE SHIELD | Admitting: Internal Medicine

## 2015-06-12 VITALS — BP 128/74 | HR 72 | Temp 97.6°F | Resp 16 | Ht 65.0 in | Wt 212.4 lb

## 2015-06-12 DIAGNOSIS — E559 Vitamin D deficiency, unspecified: Secondary | ICD-10-CM | POA: Diagnosis not present

## 2015-06-12 DIAGNOSIS — R7303 Prediabetes: Secondary | ICD-10-CM | POA: Diagnosis not present

## 2015-06-12 DIAGNOSIS — E291 Testicular hypofunction: Secondary | ICD-10-CM | POA: Diagnosis not present

## 2015-06-12 DIAGNOSIS — I10 Essential (primary) hypertension: Secondary | ICD-10-CM | POA: Diagnosis not present

## 2015-06-12 DIAGNOSIS — R0989 Other specified symptoms and signs involving the circulatory and respiratory systems: Secondary | ICD-10-CM

## 2015-06-12 DIAGNOSIS — E785 Hyperlipidemia, unspecified: Secondary | ICD-10-CM

## 2015-06-12 DIAGNOSIS — Z79899 Other long term (current) drug therapy: Secondary | ICD-10-CM | POA: Diagnosis not present

## 2015-06-12 DIAGNOSIS — E349 Endocrine disorder, unspecified: Secondary | ICD-10-CM

## 2015-06-12 LAB — CBC WITH DIFFERENTIAL/PLATELET
BASOS PCT: 1 %
Basophils Absolute: 75 cells/uL (ref 0–200)
EOS ABS: 75 {cells}/uL (ref 15–500)
Eosinophils Relative: 1 %
HEMATOCRIT: 45.6 % (ref 38.5–50.0)
HEMOGLOBIN: 15.4 g/dL (ref 13.2–17.1)
Lymphocytes Relative: 17 %
Lymphs Abs: 1275 cells/uL (ref 850–3900)
MCH: 31 pg (ref 27.0–33.0)
MCHC: 33.8 g/dL (ref 32.0–36.0)
MCV: 91.8 fL (ref 80.0–100.0)
MONO ABS: 600 {cells}/uL (ref 200–950)
MPV: 11.6 fL (ref 7.5–12.5)
Monocytes Relative: 8 %
NEUTROS PCT: 73 %
Neutro Abs: 5475 cells/uL (ref 1500–7800)
Platelets: 229 10*3/uL (ref 140–400)
RBC: 4.97 MIL/uL (ref 4.20–5.80)
RDW: 13 % (ref 11.0–15.0)
WBC: 7.5 10*3/uL (ref 3.8–10.8)

## 2015-06-12 LAB — BASIC METABOLIC PANEL WITH GFR
BUN: 16 mg/dL (ref 7–25)
CO2: 27 mmol/L (ref 20–31)
Calcium: 9.2 mg/dL (ref 8.6–10.3)
Chloride: 100 mmol/L (ref 98–110)
Creat: 0.96 mg/dL (ref 0.70–1.33)
GFR, EST NON AFRICAN AMERICAN: 87 mL/min (ref >=60)
GLUCOSE: 111 mg/dL — AB (ref 65–99)
POTASSIUM: 4.2 mmol/L (ref 3.5–5.3)
Sodium: 137 mmol/L (ref 135–146)

## 2015-06-12 LAB — HEPATIC FUNCTION PANEL
ALK PHOS: 40 U/L (ref 40–115)
ALT: 16 U/L (ref 9–46)
AST: 21 U/L (ref 10–35)
Albumin: 4.2 g/dL (ref 3.6–5.1)
BILIRUBIN INDIRECT: 0.5 mg/dL (ref 0.2–1.2)
Bilirubin, Direct: 0.1 mg/dL (ref <=0.2)
TOTAL PROTEIN: 6.6 g/dL (ref 6.1–8.1)
Total Bilirubin: 0.6 mg/dL (ref 0.2–1.2)

## 2015-06-12 LAB — LIPID PANEL
Cholesterol: 154 mg/dL (ref 125–200)
HDL: 48 mg/dL (ref >=40)
LDL CALC: 71 mg/dL (ref <130)
TRIGLYCERIDES: 173 mg/dL — AB (ref <150)
Total CHOL/HDL Ratio: 3.2 Ratio (ref <=5.0)
VLDL: 35 mg/dL — AB (ref <30)

## 2015-06-12 LAB — TSH: TSH: 0.51 mIU/L (ref 0.40–4.50)

## 2015-06-12 LAB — MAGNESIUM: Magnesium: 2 mg/dL (ref 1.5–2.5)

## 2015-06-12 LAB — HEMOGLOBIN A1C
Hgb A1c MFr Bld: 5.4 % (ref <5.7)
MEAN PLASMA GLUCOSE: 108 mg/dL
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
## 2015-06-12 NOTE — Patient Instructions (Signed)
Recommend Adult Low Dose Aspirin or   coated  Aspirin 81 mg daily   To reduce risk of Colon Cancer 20 %,   Skin Cancer 26 % ,   Melanoma 46%   and   Pancreatic cancer 60%   ++++++++++++++++++++++++++++++++++++++++++++++++++++++ Vitamin D goal   is between 70-100.   Please make sure that you are taking your Vitamin D as directed.   It is very important as a natural anti-inflammatory   helping hair, skin, and nails, as well as reducing stroke and heart attack risk.   It helps your bones and helps with mood.  It also decreases numerous cancer risks so please take it as directed.   Low Vit D is associated with a 200-300% higher risk for CANCER   and 200-300% higher risk for HEART   ATTACK  &  STROKE.   ......................................  It is also associated with higher death rate at younger ages,   autoimmune diseases like Rheumatoid arthritis, Lupus, Multiple Sclerosis.     Also many other serious conditions, like depression, Alzheimer's  Dementia, infertility, muscle aches, fatigue, fibromyalgia - just to name a few.  ++++++++++++++++++++++++++++++++++++++++++++++++  Recommend the book "The END of DIETING" by [name]   & the book "The END of DIABETES " by [name]  At [employer] - get book & Audio CD's     Being diabetic has a  300% increased risk for heart attack, stroke, cancer, and alzheimer- type vascular dementia. It is very important that you work harder with diet by avoiding all foods that are white. Avoid white rice (brown & wild rice is OK), white potatoes (sweetpotatoes in moderation is OK), White bread or wheat bread or anything made out of white flour like bagels, donuts, rolls, buns, biscuits, cakes, pastries, cookies, pizza crust, and pasta (made from white flour & egg whites) - vegetarian pasta or spinach or wheat pasta is OK. Multigrain breads like Arnold's or Pepperidge [employer], or multigrain sandwich thins or flatbreads.  Diet,  exercise and weight loss can reverse and cure diabetes in the early stages.  Diet, exercise and weight loss is very important in the control and prevention of complications of diabetes which affects every system in your body, ie. Brain - dementia/stroke, eyes - glaucoma/blindness, heart - heart attack/heart failure, kidneys - dialysis, stomach - gastric paralysis, intestines - malabsorption, nerves - severe painful neuritis, circulation - gangrene & loss of a leg(s), and finally cancer and Alzheimers.    I recommend avoid fried & greasy foods,  sweets/candy, white rice (brown or wild rice or Quinoa is OK), white potatoes (sweet potatoes are OK) - anything made from white flour - bagels, doughnuts, rolls, buns, biscuits,white and wheat breads, pizza crust and traditional pasta made of white flour & egg white(vegetarian pasta or spinach or wheat pasta is OK).  Multi-grain bread is OK - like multi-grain flat bread or sandwich thins. Avoid alcohol in excess. Exercise is also important.    Eat all the vegetables you want - avoid meat, especially red meat and dairy - especially cheese.  Cheese is the most concentrated form of trans-fats which is the worst thing to clog up our arteries. Veggie cheese is OK which can be found in the fresh produce section at [employer] or [employer] or [employer]  ++++++++++++++++++++++++++++++++++++++++++++++++++ DASH Eating Plan  DASH stands for "Dietary Approaches to Stop Hypertension."   The DASH eating plan is a healthy eating plan that has been shown to reduce high blood   pressure (hypertension). Additional health benefits may include reducing the risk of type 2 diabetes mellitus, heart disease, and stroke. The DASH eating plan may also help with weight loss.  WHAT DO I NEED TO KNOW ABOUT THE DASH EATING PLAN?  For the DASH eating plan, you will follow these general guidelines:  Choose foods with a percent daily value for sodium of less than 5% (as listed on the food  label).  Use salt-free seasonings or herbs instead of table salt or sea salt.  Check with your health care provider or pharmacist before using salt substitutes.  Eat lower-sodium products, often labeled as "lower sodium" or "no salt added."  Eat fresh foods.  Eat more vegetables, fruits, and low-fat dairy products.    Choose whole grains. Look for the word "whole" as the first word in the ingredient list.  Choose fish   Limit sweets, desserts, sugars, and sugary drinks.  Choose heart-healthy fats.  Eat veggie cheese   Eat more home-cooked food and less restaurant, buffet, and fast food.  Limit fried foods.  Cook foods using methods other than frying.  Limit canned vegetables. If you do use them, rinse them well to decrease the sodium.  When eating at a restaurant, ask that your food be prepared with less salt, or no salt if possible.                      WHAT FOODS CAN I EAT?  Read [name]'s books on The End of Dieting & The End of Diabetes  Grains  Whole grain or whole wheat bread. Brown rice. Whole grain or whole wheat pasta. Quinoa, bulgur, and whole grain cereals. Low-sodium cereals. Corn or whole wheat flour tortillas. Whole grain cornbread. Whole grain crackers. Low-sodium crackers.  Vegetables  Fresh or frozen vegetables (raw, steamed, roasted, or grilled). Low-sodium or reduced-sodium tomato and vegetable juices. Low-sodium or reduced-sodium tomato sauce and paste. Low-sodium or reduced-sodium canned vegetables.   Fruits  All fresh, canned (in natural juice), or frozen fruits.  Protein Products   All fish and seafood.  Dried beans, peas, or lentils. Unsalted nuts and seeds. Unsalted canned beans.  Dairy  Low-fat dairy products, such as skim or 1% milk, 2% or reduced-fat cheeses, low-fat ricotta or cottage cheese, or plain low-fat yogurt. Low-sodium or reduced-sodium cheeses.  Fats and Oils  Tub margarines without trans fats. Light or  reduced-fat mayonnaise and salad dressings (reduced sodium). Avocado. Safflower, olive, or canola oils. Natural peanut or almond butter.  Other  Unsalted popcorn and pretzels. The items listed above may not be a complete list of recommended foods or beverages. Contact your dietitian for more options.  +++++++++++++++++++++++++++++++++++++++++++  WHAT FOODS ARE NOT RECOMMENDED?  Grains/ White flour or wheat flour  White bread. White pasta. White rice. Refined cornbread. Bagels and croissants. Crackers that contain trans fat.  Vegetables  Creamed or fried vegetables. Vegetables in a . Regular canned vegetables. Regular canned tomato sauce and paste. Regular tomato and vegetable juices.  Fruits  Dried fruits. Canned fruit in light or heavy syrup. Fruit juice.  Meat and Other Protein Products  Meat in general - RED mwaet & White meat.  Fatty cuts of meat. Ribs, chicken wings, bacon, sausage, bologna, salami, chitterlings, fatback, hot dogs, bratwurst, and packaged luncheon meats.  Dairy  Whole or 2% milk, cream, half-and-half, and cream cheese. Whole-fat or sweetened yogurt. Full-fat cheeses or blue cheese. Nondairy creamers and whipped toppings. Processed cheese, cheese spreads, or   cheese curds.  Condiments  Onion and garlic salt, seasoned salt, table salt, and sea salt. Canned and packaged gravies. Worcestershire sauce. Tartar sauce. Barbecue sauce. Teriyaki sauce. Soy sauce, including reduced sodium. Steak sauce. Fish sauce. Oyster sauce. Cocktail sauce. Horseradish. Ketchup and mustard. Meat flavorings and tenderizers. Bouillon cubes. Hot sauce. Tabasco sauce. Marinades. Taco seasonings. Relishes.  Fats and Oils Butter, stick margarine, lard, shortening and bacon fat. Coconut, palm kernel, or palm oils. Regular salad dressings.  Pickles and olives. Salted popcorn and pretzels.  The items listed above may not be a complete list of foods and beverages to avoid.   

## 2015-06-13 ENCOUNTER — Ambulatory Visit: Payer: Self-pay | Admitting: Internal Medicine

## 2015-06-13 LAB — INSULIN, RANDOM: Insulin: 30.9 u[IU]/mL — ABNORMAL HIGH (ref 2.0–19.6)

## 2015-06-13 LAB — TESTOSTERONE: Testosterone: 1431 ng/dL — ABNORMAL HIGH (ref 250–827)

## 2015-06-13 LAB — VITAMIN D 25 HYDROXY (VIT D DEFICIENCY, FRACTURES): Vit D, 25-Hydroxy: 71 ng/mL (ref 30–100)

## 2015-06-18 DIAGNOSIS — M1711 Unilateral primary osteoarthritis, right knee: Secondary | ICD-10-CM | POA: Diagnosis not present

## 2015-06-25 ENCOUNTER — Ambulatory Visit: Payer: BLUE CROSS/BLUE SHIELD | Admitting: Acute Care

## 2015-06-25 DIAGNOSIS — G4733 Obstructive sleep apnea (adult) (pediatric): Secondary | ICD-10-CM | POA: Diagnosis not present

## 2015-06-25 DIAGNOSIS — Z79899 Other long term (current) drug therapy: Secondary | ICD-10-CM | POA: Diagnosis not present

## 2015-06-25 DIAGNOSIS — Z79891 Long term (current) use of opiate analgesic: Secondary | ICD-10-CM | POA: Diagnosis not present

## 2015-06-25 DIAGNOSIS — M25569 Pain in unspecified knee: Secondary | ICD-10-CM | POA: Diagnosis not present

## 2015-06-25 DIAGNOSIS — M179 Osteoarthritis of knee, unspecified: Secondary | ICD-10-CM | POA: Diagnosis not present

## 2015-06-25 DIAGNOSIS — G894 Chronic pain syndrome: Secondary | ICD-10-CM | POA: Diagnosis not present

## 2015-06-26 ENCOUNTER — Ambulatory Visit (INDEPENDENT_AMBULATORY_CARE_PROVIDER_SITE_OTHER): Payer: BLUE CROSS/BLUE SHIELD | Admitting: Acute Care

## 2015-06-26 ENCOUNTER — Encounter: Payer: Self-pay | Admitting: Acute Care

## 2015-06-26 VITALS — BP 118/76 | HR 57 | Ht 66.0 in | Wt 214.0 lb

## 2015-06-26 DIAGNOSIS — G4733 Obstructive sleep apnea (adult) (pediatric): Secondary | ICD-10-CM

## 2015-06-26 NOTE — Patient Instructions (Addendum)
It is nice to meet you today. We will request a download from your DME. We will call you with any recommendations based on the download results. Continue on CPAP at bedtime. You appear to be benefiting from the treatment Goal is to wear for at least 4-6 hours each night for maximal clinical benefit. Continue to work on weight loss, as the link between excess weight  and sleep apnea is well established.  Do not drive if sleepy. Follow up with Dr. Arn Medal  In 12 months or before as needed.  Please contact office for sooner follow up if symptoms do not improve or worsen or seek emergency care.

## 2015-06-26 NOTE — Progress Notes (Signed)
Reviewed and agree with assessment/plan.  Chesley Mires, MD [facility] Pulmonary/Critical Care 06/26/2015, 5:16 PM Pager:  [phone]

## 2015-06-26 NOTE — Assessment & Plan Note (Signed)
Here for annual follow up. States he is compliant with therapy. 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 Positive response to therapy Plan: Continue on CPAP at bedtime. You appear to be benefiting from the treatment Goal is to wear for at least 4-6 hours each night for maximal clinical benefit. Continue to work on weight loss, as the link between excess weight  and sleep apnea is well established.  Do not drive if sleepy. Follow up with Dr. Halford Chessman In 1 year or before as needed.

## 2015-06-26 NOTE — Progress Notes (Signed)
Subjective:    Patient ID: [name], male    DOB: Jul 24, 1960, 55 y.o.   MRN: [id number]  HPI  55 year old male former patient of Dr. Gwenette Greet followed for OSA .Started on CPAP 11/2012.  11/2012: Sleep Study:  HST 11/2012:  AHI 37/hr with desat to 72% Download 05/2013:  Auto 5-20, good compliance, some breakthru due to mild mask leak.  Auto set mode to optimize pressures.   06/26/2015  Presents to the office today for CPAP check. He was last seen by Dr. Gwenette Greet in 2014. He needs supplies for his machine.He is wearing his  CPAP every night . States that he cannot sleep without it. No daytime sleepiness. Snoring has resolved. Appears to be benefiting from treatment.He is working on weight loss.There is no download, as he has an old machine. We will request a download from his DME and assess if there is need for any changes in his therapy. He has no other complaints. No chest pain, SOB,orthopnea, hemoptysis leg or calf pain.   Current outpatient prescriptions:  .  atorvastatin (LIPITOR) 80 MG tablet, take 1 tablet by mouth once daily for cholesterol, Disp: 30 tablet, Rfl: 3 .  benazepril (LOTENSIN) 40 MG tablet, take 1 tablet by mouth AT NIGHT for blood pressure, Disp: 90 tablet, Rfl: 1 .  bisoprolol-hydrochlorothiazide (ZIAC) 10-6.25 MG per tablet, take 1 tablet by mouth once daily for blood pressure, Disp: 90 tablet, Rfl: 99 .  clonazePAM (KLONOPIN) 1 MG tablet, take 1/2-1 tablets by mouth three times a day if needed for anxiety, Disp: 90 tablet, Rfl: 5 .  docusate sodium (COLACE) 100 MG capsule, Take 100 mg by mouth daily., Disp: , Rfl:  .  HYDROmorphone (DILAUDID) 8 MG tablet, Take as directed, Disp: , Rfl: 0 .  HYDROmorphone HCl 16 MG T24A, Take 1 tablet by mouth daily., Disp: , Rfl: 0 .  minoxidil (LONITEN) 10 MG tablet, Take 1/2 to 1 tablet daily as directed for BP, Disp: 90 tablet, Rfl: 1 .  SYRINGE-NEEDLE, DISP, 3 ML (BD INTEGRA SYRINGE) 21G X 1" 3 ML MISC, Use 1 syringe and needle to  infect Testosterone., Disp: 50 each, Rfl: 0 .  testosterone cypionate (DEPOTESTOSTERONE CYPIONATE) 200 MG/ML injection, inject 1 milliliters intramuscularly every  weeks, Disp: 10 mL, Rfl: 5 .  VITAMIN D, CHOLECALCIFEROL, PO, Take 10,000 mg by mouth daily., Disp: , Rfl:  .  VOLTAREN 1 % GEL, Apply 2 g topically Daily. Reported on 06/26/2015, Disp: , Rfl:    Past Medical History  Diagnosis Date  . Diverticulitis   . Chronic pain of left knee   . Hyperlipidemia   . Labile hypertension   . Anxiety   . Prediabetes   . IBS (irritable bowel syndrome)   . Other testicular hypofunction   . Allergy   . Vitamin D deficiency   . Sleep apnea     uses CPAP  . Kidney stones   . Arthritis     Allergies  Allergen Reactions  . Penicillins Anaphylaxis  . Ace Inhibitors Other (See Comments)    unknown  . Keflex [Cephalexin] Hives    Review of Systems Constitutional:   No  weight loss, night sweats,  Fevers, chills, fatigue, or  lassitude.  HEENT:   No headaches,  Difficulty swallowing,  Tooth/dental problems, or  Sore throat,                No sneezing, itching, ear ache, nasal congestion, post nasal drip,  CV:  No chest pain,  Orthopnea, PND, swelling in lower extremities, anasarca, dizziness, palpitations, syncope.   GI  No heartburn, indigestion, abdominal pain, nausea, vomiting, diarrhea, change in bowel habits, loss of appetite, bloody stools.   Resp: No shortness of breath with exertion or at rest.  No excess mucus, no productive cough,  No non-productive cough,  No coughing up of blood.  No change in color of mucus.  No wheezing.  No chest wall deformity  Skin: no rash or lesions.  GU: no dysuria, change in color of urine, no urgency or frequency.  No flank pain, no hematuria   MS:  No joint pain or swelling.  No decreased range of motion.  No back pain.  Psych:  No change in mood or affect. No depression or anxiety.  No memory loss.        Objective:   Physical Exam BP  118/76 mmHg  Pulse 57  Ht 5\' 6"  (1.676 m)  Wt 214 lb (97.07 kg)  BMI 34.56 kg/m2  SpO2 96%  Physical Exam:  General- No distress,  A&Ox3, overweight ENT: No sinus tenderness, TM clear, pale nasal mucosa, no oral exudate,no post nasal drip, no LAN Cardiac: S1, S2, regular rate and rhythm, no murmur Chest: No wheeze/ rales/ dullness; no accessory muscle use, no nasal flaring, no sternal retractions Abd.: Obese,Soft Non-tender Ext: No clubbing cyanosis, edema Neuro:  normal strength Skin: No rashes, warm and dry Psych: normal mood and behavior  Magdalen Spatz, AGACNP-BC [facility]  06/26/2015    Assessment & Plan:

## 2015-07-02 ENCOUNTER — Ambulatory Visit (INDEPENDENT_AMBULATORY_CARE_PROVIDER_SITE_OTHER): Payer: BLUE CROSS/BLUE SHIELD | Admitting: Internal Medicine

## 2015-07-02 ENCOUNTER — Encounter: Payer: Self-pay | Admitting: Internal Medicine

## 2015-07-02 VITALS — BP 144/66 | HR 60 | Temp 97.7°F | Resp 16 | Ht 65.0 in | Wt 213.6 lb

## 2015-07-02 DIAGNOSIS — R079 Chest pain, unspecified: Secondary | ICD-10-CM

## 2015-07-02 DIAGNOSIS — Z136 Encounter for screening for cardiovascular disorders: Secondary | ICD-10-CM

## 2015-07-02 MED ORDER — NITROGLYCERIN 0.4 MG SL SUBL: SUBLINGUAL_TABLET | SUBLINGUAL | Status: DC

## 2015-07-02 NOTE — Progress Notes (Signed)
  Subjective:    Patient ID: [name], male    DOB: Mar 21, 1960, 55 y.o.   MRN: [id number]  HPI   This very nice 55 yo MWM atty with HTN (2009) had an episode yesterday after exertion of prolonged chest "tightness" lasting about 4 hr and assoc dyspnea and ? mild nausea . He has a propensity to sweating and also had during this episode yesterday . Has headed to ER and began feeling better.  so he deferred ER eval. Other times with exertion has had no sx.   Medication Sig  . Atorvastatin 80 MG take 1 tablet by mouth once daily for cholesterol  . benazepril 40 MG tablet take 1 tablet by mouth AT NIGHT for blood pressure  . bisoprolol-hctz 10-6.25 MG  take 1 tablet by mouth once daily for blood pressure  . clonazePAM  1 MG tablet take 1/2-1 tablets by mouth three times a day if needed for anxiety  . COLACE 100 MG  Take 100 mg by mouth daily.  Marland Kitchen HYDROmorphone  8 MG  Take as directed  . HYDROmorphone HCl 16 MG T24A Take 1 tablet by mouth daily.  . minoxidil  10 MG  Take 1/2 to 1 tablet daily as directed for BP  . testosterone cypionate 200 MG/ML injec inject 1 milliliters intramuscularly every  weeks  . VITAMIN D Take 10,000 mg by mouth daily.  . VOLTAREN 1 % GEL Apply 2 g topically Daily. Reported on 06/26/2015   Allergies  Allergen Reactions  . Penicillins Anaphylaxis  . Ace Inhibitors Other (See Comments)    unknown  . Keflex [Cephalexin] Hives   Past Medical History  Diagnosis Date  . Diverticulitis   . Chronic pain of left knee   . Hyperlipidemia   . Labile hypertension   . Anxiety   . Prediabetes   . IBS (irritable bowel syndrome)   . Other testicular hypofunction   . Allergy   . Vitamin D deficiency   . Sleep apnea     uses CPAP  . Kidney stones   . Arthritis    Review of Systems  10 point systems review negative except as above.    Objective:   Physical Exam   BP 144/66 mmHg  Pulse 60  Temp(Src) 97.7 F (36.5 C)  Resp 16  Ht 5\' 5"  (1.651 m)  Wt 213 lb 9.6 oz  (96.888 kg)  BMI 35.54 kg/m2  HEENT - Eac's patent. TM's Nl. EOM's full. PERRLA. NasoOroPharynx clear. Neck - supple. Nl Thyroid. Carotids 2+ & No bruits, nodes, JVD Chest - Clear equal BS w/o Rales, rhonchi, wheezes. Cor - Nl HS. RRR w/o sig MGR. PP 1(+). No edema. MS- FROM w/o deformities. Muscle power, tone and bulk Nl. Gait Nl. Neuro - No obvious Cr N abnormalities.  Nl w/o focal abnormalities. Psyche - Mental status normal & appropriate.  No delusions, ideations or obvious mood abnormalities.  EKG - NSR & WNL    Assessment & Plan:   1. Chest pain, unspecified chest pain type  - nitroGLYCERIN  0.4 MG - pending Cardiology eval  2. Screening for ischemic heart disease  - Ambulatory referral to Cardiology

## 2015-07-04 DIAGNOSIS — M1711 Unilateral primary osteoarthritis, right knee: Secondary | ICD-10-CM | POA: Diagnosis not present

## 2015-07-06 ENCOUNTER — Other Ambulatory Visit: Payer: Self-pay | Admitting: Internal Medicine

## 2015-07-06 DIAGNOSIS — K5732 Diverticulitis of large intestine without perforation or abscess without bleeding: Secondary | ICD-10-CM

## 2015-07-06 MED ORDER — METRONIDAZOLE 500 MG PO TABS: ORAL_TABLET | ORAL | Status: DC

## 2015-07-06 MED ORDER — CIPROFLOXACIN HCL 500 MG PO TABS: ORAL_TABLET | ORAL | Status: DC

## 2015-07-11 DIAGNOSIS — Z01818 Encounter for other preprocedural examination: Secondary | ICD-10-CM | POA: Diagnosis not present

## 2015-07-11 DIAGNOSIS — M1711 Unilateral primary osteoarthritis, right knee: Secondary | ICD-10-CM | POA: Diagnosis not present

## 2015-07-12 ENCOUNTER — Ambulatory Visit: Payer: BLUE CROSS/BLUE SHIELD | Admitting: Internal Medicine

## 2015-07-12 DIAGNOSIS — Z01818 Encounter for other preprocedural examination: Secondary | ICD-10-CM | POA: Diagnosis not present

## 2015-07-12 DIAGNOSIS — M1711 Unilateral primary osteoarthritis, right knee: Secondary | ICD-10-CM | POA: Diagnosis not present

## 2015-07-12 DIAGNOSIS — Z87891 Personal history of nicotine dependence: Secondary | ICD-10-CM | POA: Diagnosis not present
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
## 2015-07-13 ENCOUNTER — Encounter: Payer: Self-pay | Admitting: Cardiovascular Disease

## 2015-07-13 ENCOUNTER — Ambulatory Visit (INDEPENDENT_AMBULATORY_CARE_PROVIDER_SITE_OTHER): Payer: BLUE CROSS/BLUE SHIELD | Admitting: Cardiovascular Disease

## 2015-07-13 VITALS — BP 126/60 | HR 55 | Ht 66.0 in | Wt 210.2 lb

## 2015-07-13 DIAGNOSIS — Z7189 Other specified counseling: Secondary | ICD-10-CM | POA: Diagnosis not present

## 2015-07-13 DIAGNOSIS — Z7689 Persons encountering health services in other specified circumstances: Secondary | ICD-10-CM

## 2015-07-13 DIAGNOSIS — Z01818 Encounter for other preprocedural examination: Secondary | ICD-10-CM

## 2015-07-13 NOTE — Patient Instructions (Signed)
Medication Instructions:  Same-no changes  Labwork: None  Testing/Procedures: Your physician has requested that you have a lexiscan myoview. For further information please visit [email or web address]. Please follow instruction sheet, as given.    Follow-Up: Your physician recommends that you schedule a follow-up appointment in: 3 months     If you need a refill on your cardiac medications before your next appointment, please call your pharmacy.

## 2015-07-18 ENCOUNTER — Telehealth (HOSPITAL_COMMUNITY): Payer: Self-pay | Admitting: *Deleted

## 2015-07-18 ENCOUNTER — Telehealth (HOSPITAL_COMMUNITY): Payer: Self-pay | Admitting: Radiology

## 2015-07-18 NOTE — Telephone Encounter (Signed)
Left message on voicemail in reference to upcoming appointment scheduled for 07/20/15. Phone number given for a call back so details instructions can be given. [name], [name]

## 2015-07-18 NOTE — Telephone Encounter (Signed)
Patient given detailed instructions per Myocardial Perfusion Study Information Sheet for the test on 07/20/2014 at 7:45. Patient notified to arrive 15 minutes early and that it is imperative to arrive on time for appointment to keep from having the test rescheduled.  If you need to cancel or reschedule your appointment, please call the office within 24 hours of your appointment. Failure to do so may result in a cancellation of your appointment, and a $50 no show fee. Patient verbalized understanding.EHK

## 2015-07-20 ENCOUNTER — Ambulatory Visit (HOSPITAL_COMMUNITY): Payer: BLUE CROSS/BLUE SHIELD | Attending: Cardiology

## 2015-07-20 DIAGNOSIS — Z01818 Encounter for other preprocedural examination: Secondary | ICD-10-CM

## 2015-07-20 DIAGNOSIS — R079 Chest pain, unspecified: Secondary | ICD-10-CM | POA: Diagnosis not present

## 2015-07-20 DIAGNOSIS — R0609 Other forms of dyspnea: Secondary | ICD-10-CM | POA: Insufficient documentation

## 2015-07-20 DIAGNOSIS — I10 Essential (primary) hypertension: Secondary | ICD-10-CM | POA: Diagnosis not present

## 2015-07-20 LAB — MYOCARDIAL PERFUSION IMAGING
CHL CUP NUCLEAR SDS: 2
CHL CUP NUCLEAR SRS: 0
CHL CUP NUCLEAR SSS: 2
CHL CUP RESTING HR STRESS: 51 {beats}/min
CHL CUP STRESS STAGE 1 DBP: 78 mmHg
CHL CUP STRESS STAGE 1 GRADE: 0 %
CHL CUP STRESS STAGE 1 HR: 51 {beats}/min
CHL CUP STRESS STAGE 1 SBP: 136 mmHg
CHL CUP STRESS STAGE 2 HR: 51 {beats}/min
CHL CUP STRESS STAGE 3 GRADE: 0 %
CHL CUP STRESS STAGE 3 SBP: 142 mmHg
CHL CUP STRESS STAGE 3 SPEED: 0 mph
CHL CUP STRESS STAGE 4 GRADE: 0 %
CHL CUP STRESS STAGE 4 HR: 83 {beats}/min
CHL CUP STRESS STAGE 4 SPEED: 0 mph
CHL CUP STRESS STAGE 5 DBP: 74 mmHg
CHL CUP STRESS STAGE 5 HR: 78 {beats}/min
CHL CUP STRESS STAGE 6 DBP: 71 mmHg
CSEPEW: 1 METS
CSEPPHR: 83 {beats}/min
CSEPPMHR: 50 %
LV sys vol: 74 mL
LVDIAVOL: 151 mL (ref 62–150)
NUC STRESS TID: 1.2
RATE: 0.35
Stage 1 Speed: 0 mph
Stage 2 Grade: 0 %
Stage 2 Speed: 0 mph
Stage 3 DBP: 85 mmHg
Stage 3 HR: 74 {beats}/min
Stage 5 Grade: 0 %
Stage 5 SBP: 159 mmHg
Stage 5 Speed: 0 mph
Stage 6 Grade: 0 %
Stage 6 HR: 67 {beats}/min
Stage 6 SBP: 134 mmHg
Stage 6 Speed: 0 mph

## 2015-07-20 MED ORDER — REGADENOSON 0.4 MG/5ML IV SOLN
0.4000 mg | Freq: Once | INTRAVENOUS | Status: AC
  Administered 2015-07-20: 0.4 mg via INTRAVENOUS

## 2015-07-20 MED ORDER — TECHNETIUM TC 99M TETROFOSMIN IV KIT
31.9000 | PACK | Freq: Once | INTRAVENOUS | Status: AC | PRN
  Administered 2015-07-20: 31.9 via INTRAVENOUS
  Filled 2015-07-20: qty 32

## 2015-07-20 MED ORDER — TECHNETIUM TC 99M TETROFOSMIN IV KIT
10.6000 | PACK | Freq: Once | INTRAVENOUS | Status: AC | PRN
  Administered 2015-07-20: 11 via INTRAVENOUS
  Filled 2015-07-20: qty 11

## 2015-07-23 ENCOUNTER — Other Ambulatory Visit: Payer: Self-pay

## 2015-07-23 ENCOUNTER — Other Ambulatory Visit: Payer: Self-pay | Admitting: Internal Medicine

## 2015-07-23 DIAGNOSIS — G4733 Obstructive sleep apnea (adult) (pediatric): Secondary | ICD-10-CM | POA: Insufficient documentation

## 2015-07-23 DIAGNOSIS — M1711 Unilateral primary osteoarthritis, right knee: Secondary | ICD-10-CM | POA: Diagnosis not present

## 2015-07-23 DIAGNOSIS — Z01818 Encounter for other preprocedural examination: Secondary | ICD-10-CM | POA: Diagnosis not present

## 2015-07-24 ENCOUNTER — Telehealth: Payer: Self-pay | Admitting: Acute Care

## 2015-07-24 NOTE — Telephone Encounter (Signed)
Noted.  Documented in patient's chart. Nothing further needed.

## 2015-07-25 DIAGNOSIS — G4733 Obstructive sleep apnea (adult) (pediatric): Secondary | ICD-10-CM | POA: Diagnosis not present

## 2015-07-27 DIAGNOSIS — G4733 Obstructive sleep apnea (adult) (pediatric): Secondary | ICD-10-CM | POA: Diagnosis not present

## 2015-07-27 DIAGNOSIS — Z87891 Personal history of nicotine dependence: Secondary | ICD-10-CM | POA: Diagnosis not present

## 2015-07-27 DIAGNOSIS — G8918 Other acute postprocedural pain: Secondary | ICD-10-CM | POA: Diagnosis not present

## 2015-07-27 DIAGNOSIS — Z6836 Body mass index (BMI) 36.0-36.9, adult: Secondary | ICD-10-CM | POA: Diagnosis not present

## 2015-07-27 DIAGNOSIS — I10 Essential (primary) hypertension: Secondary | ICD-10-CM | POA: Diagnosis not present

## 2015-07-27 DIAGNOSIS — Z96651 Presence of right artificial knee joint: Secondary | ICD-10-CM | POA: Diagnosis not present

## 2015-07-27 DIAGNOSIS — E785 Hyperlipidemia, unspecified: Secondary | ICD-10-CM | POA: Diagnosis not present

## 2015-07-27 DIAGNOSIS — G8929 Other chronic pain: Secondary | ICD-10-CM | POA: Diagnosis not present

## 2015-07-27 DIAGNOSIS — M1711 Unilateral primary osteoarthritis, right knee: Secondary | ICD-10-CM | POA: Diagnosis not present

## 2015-07-27 DIAGNOSIS — R7303 Prediabetes: Secondary | ICD-10-CM | POA: Diagnosis not present

## 2015-07-27 DIAGNOSIS — D62 Acute posthemorrhagic anemia: Secondary | ICD-10-CM | POA: Diagnosis not present

## 2015-07-27 HISTORY — PX: TOTAL KNEE ARTHROPLASTY: SHX125

## 2015-07-29 DIAGNOSIS — G8918 Other acute postprocedural pain: Secondary | ICD-10-CM | POA: Diagnosis not present

## 2015-07-29 DIAGNOSIS — M1711 Unilateral primary osteoarthritis, right knee: Secondary | ICD-10-CM | POA: Diagnosis not present

## 2015-07-31 DIAGNOSIS — M79661 Pain in right lower leg: Secondary | ICD-10-CM | POA: Diagnosis not present

## 2015-07-31 DIAGNOSIS — M25661 Stiffness of right knee, not elsewhere classified: Secondary | ICD-10-CM | POA: Diagnosis not present

## 2015-07-31 DIAGNOSIS — M7989 Other specified soft tissue disorders: Secondary | ICD-10-CM | POA: Diagnosis not present

## 2015-07-31 DIAGNOSIS — R600 Localized edema: Secondary | ICD-10-CM | POA: Diagnosis not present

## 2015-07-31 DIAGNOSIS — R29898 Other symptoms and signs involving the musculoskeletal system: Secondary | ICD-10-CM | POA: Diagnosis not present

## 2015-07-31 DIAGNOSIS — M25561 Pain in right knee: Secondary | ICD-10-CM | POA: Diagnosis not present

## 2015-07-31 DIAGNOSIS — Z471 Aftercare following joint replacement surgery: Secondary | ICD-10-CM | POA: Diagnosis not present

## 2015-08-02 DIAGNOSIS — Z96651 Presence of right artificial knee joint: Secondary | ICD-10-CM | POA: Insufficient documentation

## 2015-08-13 DIAGNOSIS — R29898 Other symptoms and signs involving the musculoskeletal system: Secondary | ICD-10-CM | POA: Diagnosis not present

## 2015-08-13 DIAGNOSIS — Z471 Aftercare following joint replacement surgery: Secondary | ICD-10-CM | POA: Diagnosis not present

## 2015-08-13 DIAGNOSIS — M25561 Pain in right knee: Secondary | ICD-10-CM | POA: Diagnosis not present

## 2015-08-13 DIAGNOSIS — M25661 Stiffness of right knee, not elsewhere classified: Secondary | ICD-10-CM | POA: Diagnosis not present

## 2015-08-15 ENCOUNTER — Other Ambulatory Visit: Payer: Self-pay | Admitting: Internal Medicine

## 2015-08-15 DIAGNOSIS — R29898 Other symptoms and signs involving the musculoskeletal system: Secondary | ICD-10-CM | POA: Diagnosis not present

## 2015-08-15 DIAGNOSIS — Z471 Aftercare following joint replacement surgery: Secondary | ICD-10-CM | POA: Diagnosis not present

## 2015-08-15 DIAGNOSIS — F411 Generalized anxiety disorder: Secondary | ICD-10-CM

## 2015-08-20 DIAGNOSIS — Z471 Aftercare following joint replacement surgery: Secondary | ICD-10-CM | POA: Diagnosis not present

## 2015-08-20 DIAGNOSIS — Z96651 Presence of right artificial knee joint: Secondary | ICD-10-CM | POA: Diagnosis not present

## 2015-08-22 DIAGNOSIS — Z471 Aftercare following joint replacement surgery: Secondary | ICD-10-CM | POA: Diagnosis not present

## 2015-08-22 DIAGNOSIS — Z96651 Presence of right artificial knee joint: Secondary | ICD-10-CM | POA: Diagnosis not present

## 2015-08-24 DIAGNOSIS — G4733 Obstructive sleep apnea (adult) (pediatric): Secondary | ICD-10-CM | POA: Diagnosis not present

## 2015-08-27 DIAGNOSIS — M25569 Pain in unspecified knee: Secondary | ICD-10-CM | POA: Diagnosis not present

## 2015-08-27 DIAGNOSIS — Z79891 Long term (current) use of opiate analgesic: Secondary | ICD-10-CM | POA: Diagnosis not present

## 2015-08-27 DIAGNOSIS — Z471 Aftercare following joint replacement surgery: Secondary | ICD-10-CM | POA: Diagnosis not present

## 2015-08-27 DIAGNOSIS — Z96651 Presence of right artificial knee joint: Secondary | ICD-10-CM | POA: Diagnosis not present

## 2015-08-27 DIAGNOSIS — M179 Osteoarthritis of knee, unspecified: Secondary | ICD-10-CM | POA: Diagnosis not present

## 2015-08-27 DIAGNOSIS — G894 Chronic pain syndrome: Secondary | ICD-10-CM | POA: Diagnosis not present

## 2015-09-07 DIAGNOSIS — Z471 Aftercare following joint replacement surgery: Secondary | ICD-10-CM | POA: Diagnosis not present

## 2015-09-07 DIAGNOSIS — Z96651 Presence of right artificial knee joint: Secondary | ICD-10-CM | POA: Diagnosis not present

## 2015-09-12 DIAGNOSIS — Z96651 Presence of right artificial knee joint: Secondary | ICD-10-CM | POA: Diagnosis not present

## 2015-09-12 DIAGNOSIS — Z471 Aftercare following joint replacement surgery: Secondary | ICD-10-CM | POA: Diagnosis not present

## 2015-09-17 ENCOUNTER — Encounter: Payer: Self-pay | Admitting: Internal Medicine

## 2015-09-17 ENCOUNTER — Ambulatory Visit (INDEPENDENT_AMBULATORY_CARE_PROVIDER_SITE_OTHER): Payer: BLUE CROSS/BLUE SHIELD | Admitting: Internal Medicine

## 2015-09-17 VITALS — BP 134/60 | HR 76 | Temp 97.1°F | Resp 16 | Ht 65.0 in | Wt 202.2 lb

## 2015-09-17 DIAGNOSIS — E559 Vitamin D deficiency, unspecified: Secondary | ICD-10-CM

## 2015-09-17 DIAGNOSIS — Z79899 Other long term (current) drug therapy: Secondary | ICD-10-CM | POA: Diagnosis not present

## 2015-09-17 DIAGNOSIS — R7303 Prediabetes: Secondary | ICD-10-CM

## 2015-09-17 DIAGNOSIS — E291 Testicular hypofunction: Secondary | ICD-10-CM | POA: Diagnosis not present

## 2015-09-17 DIAGNOSIS — M170 Bilateral primary osteoarthritis of knee: Secondary | ICD-10-CM | POA: Diagnosis not present

## 2015-09-17 DIAGNOSIS — E785 Hyperlipidemia, unspecified: Secondary | ICD-10-CM | POA: Diagnosis not present

## 2015-09-17 DIAGNOSIS — E349 Endocrine disorder, unspecified: Secondary | ICD-10-CM

## 2015-09-17 DIAGNOSIS — R0989 Other specified symptoms and signs involving the circulatory and respiratory systems: Secondary | ICD-10-CM

## 2015-09-17 DIAGNOSIS — I10 Essential (primary) hypertension: Secondary | ICD-10-CM

## 2015-09-17 MED ORDER — LIDOCAINE 5 % EX OINT
1.0000 | TOPICAL_OINTMENT | CUTANEOUS | Status: DC | PRN
Start: 2015-09-17 — End: 2016-01-02

## 2015-09-17 NOTE — Patient Instructions (Signed)
Recommend Adult Low Dose Aspirin or   coated  Aspirin 81 mg daily   To reduce risk of Colon Cancer 20 %,   Skin Cancer 26 % ,   Melanoma 46%   and   Pancreatic cancer 60%   ++++++++++++++++++++++++++++++++++++++++++++++++++++++ Vitamin D goal   is between 70-100.   Please make sure that you are taking your Vitamin D as directed.   It is very important as a natural anti-inflammatory   helping hair, skin, and nails, as well as reducing stroke and heart attack risk.   It helps your bones and helps with mood.  It also decreases numerous cancer risks so please take it as directed.   Low Vit D is associated with a 200-300% higher risk for CANCER   and 200-300% higher risk for HEART   ATTACK  &  STROKE.   ......................................  It is also associated with higher death rate at younger ages,   autoimmune diseases like Rheumatoid arthritis, Lupus, Multiple Sclerosis.     Also many other serious conditions, like depression, Alzheimer's  Dementia, infertility, muscle aches, fatigue, fibromyalgia - just to name a few.  ++++++++++++++++++++++++++++++++++++++++++++++++  Recommend the book "The END of DIETING" by Dr Neyland Fuhrman   & the book "The END of DIABETES " by Dr Mateus Fuhrman  At [employer].com - get book & Audio CD's     Being diabetic has a  300% increased risk for heart attack, stroke, cancer, and alzheimer- type vascular dementia. It is very important that you work harder with diet by avoiding all foods that are white. Avoid white rice (brown & wild rice is OK), white potatoes (sweetpotatoes in moderation is OK), White bread or wheat bread or anything made out of white flour like bagels, donuts, rolls, buns, biscuits, cakes, pastries, cookies, pizza crust, and pasta (made from white flour & egg whites) - vegetarian pasta or spinach or wheat pasta is OK. Multigrain breads like Arnold's or Pepperidge Farm, or multigrain sandwich thins or flatbreads.  Diet,  exercise and weight loss can reverse and cure diabetes in the early stages.  Diet, exercise and weight loss is very important in the control and prevention of complications of diabetes which affects every system in your body, ie. Brain - dementia/stroke, eyes - glaucoma/blindness, heart - heart attack/heart failure, kidneys - dialysis, stomach - gastric paralysis, intestines - malabsorption, nerves - severe painful neuritis, circulation - gangrene & loss of a leg(s), and finally cancer and Alzheimers.    I recommend avoid fried & greasy foods,  sweets/candy, white rice (brown or wild rice or Quinoa is OK), white potatoes (sweet potatoes are OK) - anything made from white flour - bagels, doughnuts, rolls, buns, biscuits,white and wheat breads, pizza crust and traditional pasta made of white flour & egg white(vegetarian pasta or spinach or wheat pasta is OK).  Multi-grain bread is OK - like multi-grain flat bread or sandwich thins. Avoid alcohol in excess. Exercise is also important.    Eat all the vegetables you want - avoid meat, especially red meat and dairy - especially cheese.  Cheese is the most concentrated form of trans-fats which is the worst thing to clog up our arteries. Veggie cheese is OK which can be found in the fresh produce section at [employer] or [employer] or [employer]  ++++++++++++++++++++++++++++++++++++++++++++++++++ DASH Eating Plan  DASH stands for "Dietary Approaches to Stop Hypertension."   The DASH eating plan is a healthy eating plan that has been shown to reduce high blood   pressure (hypertension). Additional health benefits may include reducing the risk of type 2 diabetes mellitus, heart disease, and stroke. The DASH eating plan may also help with weight loss.  WHAT DO I NEED TO KNOW ABOUT THE DASH EATING PLAN?  For the DASH eating plan, you will follow these general guidelines:  Choose foods with a percent daily value for sodium of less than 5% (as listed on the food  label).  Use salt-free seasonings or herbs instead of table salt or sea salt.  Check with your health care provider or pharmacist before using salt substitutes.  Eat lower-sodium products, often labeled as "lower sodium" or "no salt added."  Eat fresh foods.  Eat more vegetables, fruits, and low-fat dairy products.    Choose whole grains. Look for the word "whole" as the first word in the ingredient list.  Choose fish   Limit sweets, desserts, sugars, and sugary drinks.  Choose heart-healthy fats.  Eat veggie cheese   Eat more home-cooked food and less restaurant, buffet, and fast food.  Limit fried foods.  Cook foods using methods other than frying.  Limit canned vegetables. If you do use them, rinse them well to decrease the sodium.  When eating at a restaurant, ask that your food be prepared with less salt, or no salt if possible.                      WHAT FOODS CAN I EAT?  Read Dr Jahi Fuhrman's books on The End of Dieting & The End of Diabetes  Grains  Whole grain or whole wheat bread. Brown rice. Whole grain or whole wheat pasta. Quinoa, bulgur, and whole grain cereals. Low-sodium cereals. Corn or whole wheat flour tortillas. Whole grain cornbread. Whole grain crackers. Low-sodium crackers.  Vegetables  Fresh or frozen vegetables (raw, steamed, roasted, or grilled). Low-sodium or reduced-sodium tomato and vegetable juices. Low-sodium or reduced-sodium tomato sauce and paste. Low-sodium or reduced-sodium canned vegetables.   Fruits  All fresh, canned (in natural juice), or frozen fruits.  Protein Products   All fish and seafood.  Dried beans, peas, or lentils. Unsalted nuts and seeds. Unsalted canned beans.  Dairy  Low-fat dairy products, such as skim or 1% milk, 2% or reduced-fat cheeses, low-fat ricotta or cottage cheese, or plain low-fat yogurt. Low-sodium or reduced-sodium cheeses.  Fats and Oils  Tub margarines without trans fats. Light or  reduced-fat mayonnaise and salad dressings (reduced sodium). Avocado. Safflower, olive, or canola oils. Natural peanut or almond butter.  Other  Unsalted popcorn and pretzels. The items listed above may not be a complete list of recommended foods or beverages. Contact your dietitian for more options.  +++++++++++++++++++++++++++++++++++++++++++  WHAT FOODS ARE NOT RECOMMENDED?  Grains/ White flour or wheat flour  White bread. White pasta. White rice. Refined cornbread. Bagels and croissants. Crackers that contain trans fat.  Vegetables  Creamed or fried vegetables. Vegetables in a . Regular canned vegetables. Regular canned tomato sauce and paste. Regular tomato and vegetable juices.  Fruits  Dried fruits. Canned fruit in light or heavy syrup. Fruit juice.  Meat and Other Protein Products  Meat in general - RED mwaet & White meat.  Fatty cuts of meat. Ribs, chicken wings, bacon, sausage, bologna, salami, chitterlings, fatback, hot dogs, bratwurst, and packaged luncheon meats.  Dairy  Whole or 2% milk, cream, half-and-half, and cream cheese. Whole-fat or sweetened yogurt. Full-fat cheeses or blue cheese. Nondairy creamers and whipped toppings. Processed cheese, cheese spreads, or   cheese curds.  Condiments  Onion and garlic salt, seasoned salt, table salt, and sea salt. Canned and packaged gravies. Worcestershire sauce. Tartar sauce. Barbecue sauce. Teriyaki sauce. Soy sauce, including reduced sodium. Steak sauce. Fish sauce. Oyster sauce. Cocktail sauce. Horseradish. Ketchup and mustard. Meat flavorings and tenderizers. Bouillon cubes. Hot sauce. Tabasco sauce. Marinades. Taco seasonings. Relishes.  Fats and Oils Butter, stick margarine, lard, shortening and bacon fat. Coconut, palm kernel, or palm oils. Regular salad dressings.  Pickles and olives. Salted popcorn and pretzels.  The items listed above may not be a complete list of foods and beverages to avoid.   
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
## 2015-09-18 LAB — HEPATIC FUNCTION PANEL
ALBUMIN: 4.3 g/dL (ref 3.6–5.1)
ALT: 12 U/L (ref 9–46)
AST: 16 U/L (ref 10–35)
Alkaline Phosphatase: 53 U/L (ref 40–115)
BILIRUBIN DIRECT: 0.1 mg/dL (ref <=0.2)
BILIRUBIN TOTAL: 0.5 mg/dL (ref 0.2–1.2)
Indirect Bilirubin: 0.4 mg/dL (ref 0.2–1.2)
Total Protein: 7 g/dL (ref 6.1–8.1)

## 2015-09-18 LAB — CBC WITH DIFFERENTIAL/PLATELET
BASOS ABS: 84 {cells}/uL (ref 0–200)
BASOS PCT: 1 %
EOS ABS: 0 {cells}/uL — AB (ref 15–500)
EOS PCT: 0 %
HCT: 46.1 % (ref 38.5–50.0)
Hemoglobin: 15.5 g/dL (ref 13.2–17.1)
LYMPHS PCT: 17 %
Lymphs Abs: 1428 cells/uL (ref 850–3900)
MCH: 30.7 pg (ref 27.0–33.0)
MCHC: 33.6 g/dL (ref 32.0–36.0)
MCV: 91.3 fL (ref 80.0–100.0)
MONOS PCT: 9 %
MPV: 10.8 fL (ref 7.5–12.5)
Monocytes Absolute: 756 cells/uL (ref 200–950)
NEUTROS ABS: 6132 {cells}/uL (ref 1500–7800)
Neutrophils Relative %: 73 %
PLATELETS: 244 10*3/uL (ref 140–400)
RBC: 5.05 MIL/uL (ref 4.20–5.80)
RDW: 14 % (ref 11.0–15.0)
WBC: 8.4 10*3/uL (ref 3.8–10.8)

## 2015-09-18 LAB — INSULIN, RANDOM: INSULIN: 36.8 u[IU]/mL — AB (ref 2.0–19.6)

## 2015-09-18 LAB — LIPID PANEL
Cholesterol: 149 mg/dL (ref 125–200)
HDL: 48 mg/dL (ref >=40)
LDL CALC: 75 mg/dL (ref <130)
Total CHOL/HDL Ratio: 3.1 Ratio (ref <=5.0)
Triglycerides: 130 mg/dL (ref <150)
VLDL: 26 mg/dL (ref <30)

## 2015-09-18 LAB — VITAMIN D 25 HYDROXY (VIT D DEFICIENCY, FRACTURES): VIT D 25 HYDROXY: 89 ng/mL (ref 30–100)

## 2015-09-18 LAB — MAGNESIUM: MAGNESIUM: 1.9 mg/dL (ref 1.5–2.5)

## 2015-09-18 LAB — BASIC METABOLIC PANEL WITH GFR
BUN: 10 mg/dL (ref 7–25)
CHLORIDE: 100 mmol/L (ref 98–110)
CO2: 27 mmol/L (ref 20–31)
CREATININE: 1.2 mg/dL (ref 0.70–1.33)
Calcium: 9.7 mg/dL (ref 8.6–10.3)
GFR, EST AFRICAN AMERICAN: 77 mL/min (ref >=60)
GFR, Est Non African American: 67 mL/min (ref >=60)
Glucose, Bld: 95 mg/dL (ref 65–99)
Potassium: 4.1 mmol/L (ref 3.5–5.3)
SODIUM: 143 mmol/L (ref 135–146)

## 2015-09-18 LAB — HEMOGLOBIN A1C
HEMOGLOBIN A1C: 5.3 % (ref <5.7)
Mean Plasma Glucose: 105 mg/dL

## 2015-09-18 LAB — TSH: TSH: 1.32 m[IU]/L (ref 0.40–4.50)

## 2015-09-24 DIAGNOSIS — G4733 Obstructive sleep apnea (adult) (pediatric): Secondary | ICD-10-CM | POA: Diagnosis not present

## 2015-09-25 DIAGNOSIS — G894 Chronic pain syndrome: Secondary | ICD-10-CM | POA: Diagnosis not present

## 2015-09-25 DIAGNOSIS — Z79891 Long term (current) use of opiate analgesic: Secondary | ICD-10-CM | POA: Diagnosis not present

## 2015-09-25 DIAGNOSIS — M179 Osteoarthritis of knee, unspecified: Secondary | ICD-10-CM | POA: Diagnosis not present

## 2015-09-25 DIAGNOSIS — Z79899 Other long term (current) drug therapy: Secondary | ICD-10-CM | POA: Diagnosis not present

## 2015-10-10 ENCOUNTER — Encounter: Payer: Self-pay | Admitting: Cardiovascular Disease

## 2015-10-24 ENCOUNTER — Ambulatory Visit: Payer: BLUE CROSS/BLUE SHIELD | Admitting: Cardiovascular Disease

## 2015-10-24 DIAGNOSIS — G4733 Obstructive sleep apnea (adult) (pediatric): Secondary | ICD-10-CM | POA: Diagnosis not present

## 2015-11-06 ENCOUNTER — Other Ambulatory Visit: Payer: Self-pay | Admitting: Internal Medicine

## 2015-11-06 MED ORDER — GABAPENTIN 100 MG PO CAPS: ORAL_CAPSULE | ORAL | 2 refills | Status: DC

## 2015-11-06 NOTE — Progress Notes (Signed)
  Patient called & related that he's tapered and off all Opioid meds for 10 days and is having difficulties with sleep. Discussed retry with Gabapentin 100 mg - to take  1 to 2 caps  1-2 hours before sleep and possibly try Duloxetine and / or Trazodone in the future.

## 2015-11-23 DIAGNOSIS — G4733 Obstructive sleep apnea (adult) (pediatric): Secondary | ICD-10-CM | POA: Diagnosis not present

## 2015-11-28 DIAGNOSIS — M189 Osteoarthritis of first carpometacarpal joint, unspecified: Secondary | ICD-10-CM | POA: Diagnosis not present

## 2015-11-28 DIAGNOSIS — M1990 Unspecified osteoarthritis, unspecified site: Secondary | ICD-10-CM | POA: Diagnosis not present

## 2015-12-05 ENCOUNTER — Other Ambulatory Visit: Payer: Self-pay | Admitting: Internal Medicine

## 2015-12-25 DIAGNOSIS — G4733 Obstructive sleep apnea (adult) (pediatric): Secondary | ICD-10-CM | POA: Diagnosis not present

## 2016-01-02 ENCOUNTER — Ambulatory Visit (INDEPENDENT_AMBULATORY_CARE_PROVIDER_SITE_OTHER): Payer: BLUE CROSS/BLUE SHIELD | Admitting: Internal Medicine

## 2016-01-02 ENCOUNTER — Encounter (INDEPENDENT_AMBULATORY_CARE_PROVIDER_SITE_OTHER): Payer: Self-pay

## 2016-01-02 ENCOUNTER — Encounter: Payer: Self-pay | Admitting: Internal Medicine

## 2016-01-02 ENCOUNTER — Other Ambulatory Visit: Payer: Self-pay | Admitting: *Deleted

## 2016-01-02 VITALS — BP 126/74 | HR 72 | Temp 97.7°F | Resp 16 | Ht 65.5 in | Wt 212.4 lb

## 2016-01-02 DIAGNOSIS — Z111 Encounter for screening for respiratory tuberculosis: Secondary | ICD-10-CM

## 2016-01-02 DIAGNOSIS — M2550 Pain in unspecified joint: Secondary | ICD-10-CM

## 2016-01-02 DIAGNOSIS — Z0000 Encounter for general adult medical examination without abnormal findings: Secondary | ICD-10-CM | POA: Diagnosis not present

## 2016-01-02 DIAGNOSIS — G8929 Other chronic pain: Secondary | ICD-10-CM

## 2016-01-02 DIAGNOSIS — G4733 Obstructive sleep apnea (adult) (pediatric): Secondary | ICD-10-CM

## 2016-01-02 DIAGNOSIS — I10 Essential (primary) hypertension: Secondary | ICD-10-CM

## 2016-01-02 DIAGNOSIS — Z125 Encounter for screening for malignant neoplasm of prostate: Secondary | ICD-10-CM | POA: Diagnosis not present

## 2016-01-02 DIAGNOSIS — Z136 Encounter for screening for cardiovascular disorders: Secondary | ICD-10-CM | POA: Diagnosis not present

## 2016-01-02 DIAGNOSIS — Z1212 Encounter for screening for malignant neoplasm of rectum: Secondary | ICD-10-CM

## 2016-01-02 DIAGNOSIS — Z23 Encounter for immunization: Secondary | ICD-10-CM

## 2016-01-02 DIAGNOSIS — E349 Endocrine disorder, unspecified: Secondary | ICD-10-CM

## 2016-01-02 DIAGNOSIS — E559 Vitamin D deficiency, unspecified: Secondary | ICD-10-CM

## 2016-01-02 DIAGNOSIS — R7303 Prediabetes: Secondary | ICD-10-CM

## 2016-01-02 DIAGNOSIS — E782 Mixed hyperlipidemia: Secondary | ICD-10-CM

## 2016-01-02 DIAGNOSIS — R5383 Other fatigue: Secondary | ICD-10-CM

## 2016-01-02 DIAGNOSIS — R0989 Other specified symptoms and signs involving the circulatory and respiratory systems: Secondary | ICD-10-CM

## 2016-01-02 DIAGNOSIS — Z0001 Encounter for general adult medical examination with abnormal findings: Secondary | ICD-10-CM

## 2016-01-02 DIAGNOSIS — Z79899 Other long term (current) drug therapy: Secondary | ICD-10-CM | POA: Diagnosis not present

## 2016-01-02 LAB — CBC WITH DIFFERENTIAL/PLATELET
BASOS ABS: 73 {cells}/uL (ref 0–200)
BASOS PCT: 1 %
EOS ABS: 146 {cells}/uL (ref 15–500)
Eosinophils Relative: 2 %
HEMATOCRIT: 51 % — AB (ref 38.5–50.0)
HEMOGLOBIN: 17.4 g/dL — AB (ref 13.2–17.1)
Lymphocytes Relative: 19 %
Lymphs Abs: 1387 cells/uL (ref 850–3900)
MCH: 31.2 pg (ref 27.0–33.0)
MCHC: 34.1 g/dL (ref 32.0–36.0)
MCV: 91.4 fL (ref 80.0–100.0)
MONO ABS: 803 {cells}/uL (ref 200–950)
MPV: 10.7 fL (ref 7.5–12.5)
Monocytes Relative: 11 %
NEUTROS ABS: 4891 {cells}/uL (ref 1500–7800)
Neutrophils Relative %: 67 %
Platelets: 189 10*3/uL (ref 140–400)
RBC: 5.58 MIL/uL (ref 4.20–5.80)
RDW: 14.6 % (ref 11.0–15.0)
WBC: 7.3 10*3/uL (ref 3.8–10.8)

## 2016-01-02 LAB — LIPID PANEL
Cholesterol: 134 mg/dL (ref 125–200)
HDL: 47 mg/dL (ref >=40)
LDL CALC: 54 mg/dL (ref <130)
Total CHOL/HDL Ratio: 2.9 Ratio (ref <=5.0)
Triglycerides: 165 mg/dL — ABNORMAL HIGH (ref <150)
VLDL: 33 mg/dL — ABNORMAL HIGH (ref <30)

## 2016-01-02 LAB — BASIC METABOLIC PANEL WITH GFR
BUN: 12 mg/dL (ref 7–25)
CHLORIDE: 101 mmol/L (ref 98–110)
CO2: 29 mmol/L (ref 20–31)
Calcium: 10.2 mg/dL (ref 8.6–10.3)
Creat: 1.17 mg/dL (ref 0.70–1.33)
GFR, Est African American: 80 mL/min (ref >=60)
GFR, Est Non African American: 69 mL/min (ref >=60)
GLUCOSE: 70 mg/dL (ref 65–99)
POTASSIUM: 4.3 mmol/L (ref 3.5–5.3)
Sodium: 140 mmol/L (ref 135–146)

## 2016-01-02 LAB — HEPATIC FUNCTION PANEL
ALBUMIN: 4.6 g/dL (ref 3.6–5.1)
ALK PHOS: 47 U/L (ref 40–115)
ALT: 32 U/L (ref 9–46)
AST: 26 U/L (ref 10–35)
BILIRUBIN TOTAL: 0.8 mg/dL (ref 0.2–1.2)
Bilirubin, Direct: 0.2 mg/dL (ref <=0.2)
Indirect Bilirubin: 0.6 mg/dL (ref 0.2–1.2)
TOTAL PROTEIN: 7.2 g/dL (ref 6.1–8.1)

## 2016-01-02 LAB — VITAMIN B12: VITAMIN B 12: 615 pg/mL (ref 200–1100)

## 2016-01-02 LAB — TSH: TSH: 0.61 m[IU]/L (ref 0.40–4.50)

## 2016-01-02 LAB — PSA: PSA: 1.6 ng/mL (ref <=4.0)

## 2016-01-02 MED ORDER — TESTOSTERONE CYPIONATE 200 MG/ML IM SOLN: INTRAMUSCULAR | 5 refills | Status: DC

## 2016-01-02 MED ORDER — LOSARTAN POTASSIUM 100 MG PO TABS: ORAL_TABLET | ORAL | 1 refills | Status: DC
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
## 2016-01-02 NOTE — Patient Instructions (Signed)
Preventive Care for Adults  A healthy lifestyle and preventive care can promote health and wellness. Preventive health guidelines for men include the following key practices:  A routine yearly physical is a good way to check with your health care provider about your health and preventative screening. It is a chance to share any concerns and updates on your health and to receive a thorough exam.  Visit your dentist for a routine exam and preventative care every 6 months. Brush your teeth twice a day and floss once a day. Good oral hygiene prevents tooth decay and gum disease.  The frequency of eye exams is based on your age, health, family medical history, use of contact lenses, and other factors. Follow your health care provider's recommendations for frequency of eye exams.  Eat a healthy diet. Foods such as vegetables, fruits, whole grains, low-fat dairy products, and lean protein foods contain the nutrients you need without too many calories. Decrease your intake of foods high in solid fats, added sugars, and salt. Eat the right amount of calories for you.Get information about a proper diet from your health care provider, if necessary.  Regular physical exercise is one of the most important things you can do for your health. Most adults should get at least 150 minutes of moderate-intensity exercise (any activity that increases your heart rate and causes you to sweat) each week. In addition, most adults need muscle-strengthening exercises on 2 or more days a week.  Maintain a healthy weight. The body mass index (BMI) is a screening tool to identify possible weight problems. It provides an estimate of body fat based on height and weight. Your health care provider can find your BMI and can help you achieve or maintain a healthy weight.For adults 20 years and older:  A BMI below 18.5 is considered underweight.  A BMI of 18.5 to 24.9 is normal.  A BMI of 25 to 29.9 is considered overweight.  A  BMI of 30 and above is considered obese.  Maintain normal blood lipids and cholesterol levels by exercising and minimizing your intake of saturated fat. Eat a balanced diet with plenty of fruit and vegetables. Blood tests for lipids and cholesterol should begin at age 20 and be repeated every 5 years. If your lipid or cholesterol levels are high, you are over 50, or you are at high risk for heart disease, you may need your cholesterol levels checked more frequently.Ongoing high lipid and cholesterol levels should be treated with medicines if diet and exercise are not working.  If you smoke, find out from your health care provider how to quit. If you do not use tobacco, do not start.  Lung cancer screening is recommended for adults aged 55-80 years who are at high risk for developing lung cancer because of a history of smoking. A yearly low-dose CT scan of the lungs is recommended for people who have at least a 30-pack-year history of smoking and are a current smoker or have quit within the past 15 years. A pack year of smoking is smoking an average of 1 pack of cigarettes a day for 1 year (for example: 1 pack a day for 30 years or 2 packs a day for 15 years). Yearly screening should continue until the smoker has stopped smoking for at least 15 years. Yearly screening should be stopped for people who develop a health problem that would prevent them from having lung cancer treatment.  If you choose to drink alcohol, do not have more   than 2 drinks per day. One drink is considered to be 12 ounces (355 mL) of beer, 5 ounces (148 mL) of wine, or 1.5 ounces (44 mL) of liquor.  Avoid use of street drugs. Do not share needles with anyone. Ask for help if you need support or instructions about stopping the use of drugs.  High blood pressure causes heart disease and increases the risk of stroke. Your blood pressure should be checked at least every 1-2 years. Ongoing high blood pressure should be treated with  medicines, if weight loss and exercise are not effective.  If you are 45-79 years old, ask your health care provider if you should take aspirin to prevent heart disease.  Diabetes screening involves taking a blood sample to check your fasting blood sugar level. This should be done once every 3 years, after age 45, if you are within normal weight and without risk factors for diabetes. Testing should be considered at a younger age or be carried out more frequently if you are overweight and have at least 1 risk factor for diabetes.  Colorectal cancer can be detected and often prevented. Most routine colorectal cancer screening begins at the age of 50 and continues through age 75. However, your health care provider may recommend screening at an earlier age if you have risk factors for colon cancer. On a yearly basis, your health care provider may provide home test kits to check for hidden blood in the stool. Use of a small camera at the end of a tube to directly examine the colon (sigmoidoscopy or colonoscopy) can detect the earliest forms of colorectal cancer. Talk to your health care provider about this at age 50, when routine screening begins. Direct exam of the colon should be repeated every 5-10 years through age 75, unless early forms of precancerous polyps or small growths are found.   Talk with your health care provider about prostate cancer screening.  Testicular cancer screening isrecommended for adult males. Screening includes self-exam, a health care provider exam, and other screening tests. Consult with your health care provider about any symptoms you have or any concerns you have about testicular cancer.  Use sunscreen. Apply sunscreen liberally and repeatedly throughout the day. You should seek shade when your shadow is shorter than you. Protect yourself by wearing long sleeves, pants, a wide-brimmed hat, and sunglasses year round, whenever you are outdoors.  Once a month, do a whole-body  skin exam, using a mirror to look at the skin on your back. Tell your health care provider about new moles, moles that have irregular borders, moles that are larger than a pencil eraser, or moles that have changed in shape or color.  Stay current with required vaccines (immunizations).  Influenza vaccine. All adults should be immunized every year.  Tetanus, diphtheria, and acellular pertussis (Td, Tdap) vaccine. An adult who has not previously received Tdap or who does not know his vaccine status should receive 1 dose of Tdap. This initial dose should be followed by tetanus and diphtheria toxoids (Td) booster doses every 10 years. Adults with an unknown or incomplete history of completing a 3-dose immunization series with Td-containing vaccines should begin or complete a primary immunization series including a Tdap dose. Adults should receive a Td booster every 10 years.  Varicella vaccine. An adult without evidence of immunity to varicella should receive 2 doses or a second dose if he has previously received 1 dose.  Human papillomavirus (HPV) vaccine. Males aged 13-21 years who have not   received the vaccine previously should receive the 3-dose series. Males aged 22-26 years may be immunized. Immunization is recommended through the age of 26 years for any male who has sex with males and did not get any or all doses earlier. Immunization is recommended for any person with an immunocompromised condition through the age of 26 years if he did not get any or all doses earlier. During the 3-dose series, the second dose should be obtained 4-8 weeks after the first dose. The third dose should be obtained 24 weeks after the first dose and 16 weeks after the second dose.  Zoster vaccine. One dose is recommended for adults aged 60 years or older unless certain conditions are present.    PREVNAR  - Pneumococcal 13-valent conjugate (PCV13) vaccine. When indicated, a person who is uncertain of his immunization  history and has no record of immunization should receive the PCV13 vaccine. An adult aged 19 years or older who has certain medical conditions and has not been previously immunized should receive 1 dose of PCV13 vaccine. This PCV13 should be followed with a dose of pneumococcal polysaccharide (PPSV23) vaccine. The PPSV23 vaccine dose should be obtained at least 8 weeks after the dose of PCV13 vaccine. An adult aged 19 years or older who has certain medical conditions and previously received 1 or more doses of PPSV23 vaccine should receive 1 dose of PCV13. The PCV13 vaccine dose should be obtained 1 or more years after the last PPSV23 vaccine dose.    PNEUMOVAX - Pneumococcal polysaccharide (PPSV23) vaccine. When PCV13 is also indicated, PCV13 should be obtained first. All adults aged 65 years and older should be immunized. An adult younger than age 65 years who has certain medical conditions should be immunized. Any person who resides in a nursing home or long-term care facility should be immunized. An adult smoker should be immunized. People with an immunocompromised condition and certain other conditions should receive both PCV13 and PPSV23 vaccines. People with human immunodeficiency virus (HIV) infection should be immunized as soon as possible after diagnosis. Immunization during chemotherapy or radiation therapy should be avoided. Routine use of PPSV23 vaccine is not recommended for American Indians, [place] Natives, or people younger than 65 years unless there are medical conditions that require PPSV23 vaccine. When indicated, people who have unknown immunization and have no record of immunization should receive PPSV23 vaccine. One-time revaccination 5 years after the first dose of PPSV23 is recommended for people aged 19-64 years who have chronic kidney failure, nephrotic syndrome, asplenia, or immunocompromised conditions. People who received 1-2 doses of PPSV23 before age 65 years should receive another  dose of PPSV23 vaccine at age 65 years or later if at least 5 years have passed since the previous dose. Doses of PPSV23 are not needed for people immunized with PPSV23 at or after age 65 years.    Hepatitis A vaccine. Adults who wish to be protected from this disease, have certain high-risk conditions, work with hepatitis A-infected animals, work in hepatitis A research labs, or travel to or work in countries with a high rate of hepatitis A should be immunized. Adults who were previously unvaccinated and who anticipate close contact with an international adoptee during the first 60 days after arrival in the [place] from a country with a high rate of hepatitis A should be immunized.    Hepatitis B vaccine. Adults should be immunized if they wish to be protected from this disease, have certain high-risk conditions, may be exposed to   blood or other infectious body fluids, are household contacts or sex partners of hepatitis B positive people, are clients or workers in certain care facilities, or travel to or work in countries with a high rate of hepatitis B.   Preventive Service / Frequency   Ages 40 to 64  Blood pressure check.  Lipid and cholesterol check  Lung cancer screening. / Every year if you are aged 55-80 years and have a 30-pack-year history of smoking and currently smoke or have quit within the past 15 years. Yearly screening is stopped once you have quit smoking for at least 15 years or develop a health problem that would prevent you from having lung cancer treatment.  Fecal occult blood test (FOBT) of stool. / Every year beginning at age 50 and continuing until age 75. You may not have to do this test if you get a colonoscopy every 10 years.  Flexible sigmoidoscopy** or colonoscopy.** / Every 5 years for a flexible sigmoidoscopy or every 10 years for a colonoscopy beginning at age 50 and continuing until age 75. Screening for abdominal aortic aneurysm (AAA)  by ultrasound is  recommended for people who have history of high blood pressure or who are current or former smokers. +++++++++++ Recommend Adult Low Dose Aspirin or  coated  Aspirin 81 mg daily  To reduce risk of Colon Cancer 20 %,  Skin Cancer 26 % ,  Melanoma 46%  and  Pancreatic cancer 60% ++++++++++++++++++++ Vitamin D goal  is between 70-100.  Please make sure that you are taking your Vitamin D as directed.  It is very important as a natural anti-inflammatory  helping hair, skin, and nails, as well as reducing stroke and heart attack risk.  It helps your bones and helps with mood. It also decreases numerous cancer risks so please take it as directed.  Low Vit D is associated with a 200-300% higher risk for CANCER  and 200-300% higher risk for HEART   ATTACK  &  STROKE.   ...................................... It is also associated with higher death rate at younger ages,  autoimmune diseases like Rheumatoid arthritis, Lupus, Multiple Sclerosis.    Also many other serious conditions, like depression, Alzheimer's Dementia, infertility, muscle aches, fatigue, fibromyalgia - just to name a few. +++++++++++++++++++++ Recommend the book "The END of DIETING" by Dr Haji Fuhrman  & the book "The END of DIABETES " by Dr Huston Fuhrman At [employer] - get book & Audio CD's    Being diabetic has a  300% increased risk for heart attack, stroke, cancer, and alzheimer- type vascular dementia. It is very important that you work harder with diet by avoiding all foods that are white. Avoid white rice (brown & wild rice is OK), white potatoes (sweetpotatoes in moderation is OK), White bread or wheat bread or anything made out of white flour like bagels, donuts, rolls, buns, biscuits, cakes, pastries, cookies, pizza crust, and pasta (made from white flour & egg whites) - vegetarian pasta or spinach or wheat pasta is OK. Multigrain breads like Arnold's or [employer], or multigrain sandwich thins or flatbreads.  Diet,  exercise and weight loss can reverse and cure diabetes in the early stages.  Diet, exercise and weight loss is very important in the control and prevention of complications of diabetes which affects every system in your body, ie. Brain - dementia/stroke, eyes - glaucoma/blindness, heart - heart attack/heart failure, kidneys - dialysis, stomach - gastric paralysis, intestines - malabsorption, nerves - severe painful neuritis, circulation -   gangrene & loss of a leg(s), and finally cancer and Alzheimers.    I recommend avoid fried & greasy foods,  sweets/candy, white rice (brown or wild rice or Quinoa is OK), white potatoes (sweet potatoes are OK) - anything made from white flour - bagels, doughnuts, rolls, buns, biscuits,white and wheat breads, pizza crust and traditional pasta made of white flour & egg white(vegetarian pasta or spinach or wheat pasta is OK).  Multi-grain bread is OK - like multi-grain flat bread or sandwich thins. Avoid alcohol in excess. Exercise is also important.    Eat all the vegetables you want - avoid meat, especially red meat and dairy - especially cheese.  Cheese is the most concentrated form of trans-fats which is the worst thing to clog up our arteries. Veggie cheese is OK which can be found in the fresh produce section at [employer] or [employer] or [employer]  ++++++++++++++++++++++ DASH Eating Plan  DASH stands for "Dietary Approaches to Stop Hypertension."   The DASH eating plan is a healthy eating plan that has been shown to reduce high blood pressure (hypertension). Additional health benefits may include reducing the risk of type 2 diabetes mellitus, heart disease, and stroke. The DASH eating plan may also help with weight loss. WHAT DO I NEED TO KNOW ABOUT THE DASH EATING PLAN? For the DASH eating plan, you will follow these general guidelines:  Choose foods with a percent daily value for sodium of less than 5% (as listed on the food label).  Use salt-free  seasonings or herbs instead of table salt or sea salt.  Check with your health care provider or pharmacist before using salt substitutes.  Eat lower-sodium products, often labeled as "lower sodium" or "no salt added."  Eat fresh foods.  Eat more vegetables, fruits, and low-fat dairy products.  Choose whole grains. Look for the word "whole" as the first word in the ingredient list.  Choose fish   Limit sweets, desserts, sugars, and sugary drinks.  Choose heart-healthy fats.  Eat veggie cheese   Eat more home-cooked food and less restaurant, buffet, and fast food.  Limit fried foods.  Cook foods using methods other than frying.  Limit canned vegetables. If you do use them, rinse them well to decrease the sodium.  When eating at a restaurant, ask that your food be prepared with less salt, or no salt if possible.                      WHAT FOODS CAN I EAT? Read Dr Artist Fuhrman's books on The End of Dieting & The End of Diabetes  Grains Whole grain or whole wheat bread. Brown rice. Whole grain or whole wheat pasta. Quinoa, bulgur, and whole grain cereals. Low-sodium cereals. Corn or whole wheat flour tortillas. Whole grain cornbread. Whole grain crackers. Low-sodium crackers.  Vegetables Fresh or frozen vegetables (raw, steamed, roasted, or grilled). Low-sodium or reduced-sodium tomato and vegetable juices. Low-sodium or reduced-sodium tomato sauce and paste. Low-sodium or reduced-sodium canned vegetables.   Fruits All fresh, canned (in natural juice), or frozen fruits.  Protein Products  All fish and seafood.  Dried beans, peas, or lentils. Unsalted nuts and seeds. Unsalted canned beans.  Dairy Low-fat dairy products, such as skim or 1% milk, 2% or reduced-fat cheeses, low-fat ricotta or cottage cheese, or plain low-fat yogurt. Low-sodium or reduced-sodium cheeses.  Fats and Oils Tub margarines without trans fats. Light or reduced-fat mayonnaise and salad dressings  (reduced sodium). Avocado. Safflower,   olive, or canola oils. Natural peanut or almond butter.  Other Unsalted popcorn and pretzels. The items listed above may not be a complete list of recommended foods or beverages. Contact your dietitian for more options.  +++++++++++++++++++  WHAT FOODS ARE NOT RECOMMENDED? Grains/ White flour or wheat flour White bread. White pasta. White rice. Refined cornbread. Bagels and croissants. Crackers that contain trans fat.  Vegetables  Creamed or fried vegetables. Vegetables in a . Regular canned vegetables. Regular canned tomato sauce and paste. Regular tomato and vegetable juices.  Fruits Dried fruits. Canned fruit in light or heavy syrup. Fruit juice.  Meat and Other Protein Products Meat in general - RED meat & White meat.  Fatty cuts of meat. Ribs, chicken wings, all processed meats as bacon, sausage, bologna, salami, fatback, hot dogs, bratwurst and packaged luncheon meats.  Dairy Whole or 2% milk, cream, half-and-half, and cream cheese. Whole-fat or sweetened yogurt. Full-fat cheeses or blue cheese. Non-dairy creamers and whipped toppings. Processed cheese, cheese spreads, or cheese curds.  Condiments Onion and garlic salt, seasoned salt, table salt, and sea salt. Canned and packaged gravies. Worcestershire sauce. Tartar sauce. Barbecue sauce. Teriyaki sauce. Soy sauce, including reduced sodium. Steak sauce. Fish sauce. Oyster sauce. Cocktail sauce. Horseradish. Ketchup and mustard. Meat flavorings and tenderizers. Bouillon cubes. Hot sauce. Tabasco sauce. Marinades. Taco seasonings. Relishes.  Fats and Oils Butter, stick margarine, lard, shortening and bacon fat. Coconut, palm kernel, or palm oils. Regular salad dressings.  Pickles and olives. Salted popcorn and pretzels.  The items listed above may not be a complete list of foods and beverages to avoid.    

## 2016-01-03 DIAGNOSIS — D2372 Other benign neoplasm of skin of left lower limb, including hip: Secondary | ICD-10-CM | POA: Diagnosis not present

## 2016-01-03 DIAGNOSIS — D485 Neoplasm of uncertain behavior of skin: Secondary | ICD-10-CM | POA: Diagnosis not present

## 2016-01-03 DIAGNOSIS — D2272 Melanocytic nevi of left lower limb, including hip: Secondary | ICD-10-CM | POA: Diagnosis not present

## 2016-01-03 DIAGNOSIS — C44612 Basal cell carcinoma of skin of right upper limb, including shoulder: Secondary | ICD-10-CM | POA: Diagnosis not present

## 2016-01-03 DIAGNOSIS — D2362 Other benign neoplasm of skin of left upper limb, including shoulder: Secondary | ICD-10-CM | POA: Diagnosis not present

## 2016-01-03 DIAGNOSIS — D225 Melanocytic nevi of trunk: Secondary | ICD-10-CM | POA: Diagnosis not present

## 2016-01-03 LAB — HEMOGLOBIN A1C
Hgb A1c MFr Bld: 5.7 % — ABNORMAL HIGH (ref <5.7)
Mean Plasma Glucose: 117 mg/dL

## 2016-01-03 LAB — MICROALBUMIN / CREATININE URINE RATIO
Creatinine, Urine: 165 mg/dL (ref 20–370)
Microalb Creat Ratio: 13 mcg/mg creat (ref <30)
Microalb, Ur: 2.1 mg/dL

## 2016-01-03 LAB — IRON AND TIBC
%SAT: 24 % (ref 15–60)
IRON: 94 ug/dL (ref 50–180)
TIBC: 385 ug/dL (ref 250–425)
UIBC: 291 ug/dL (ref 125–400)

## 2016-01-03 LAB — URINALYSIS, ROUTINE W REFLEX MICROSCOPIC
Bilirubin Urine: NEGATIVE
Glucose, UA: NEGATIVE
HGB URINE DIPSTICK: NEGATIVE
KETONES UR: NEGATIVE
LEUKOCYTES UA: NEGATIVE
NITRITE: NEGATIVE
Protein, ur: NEGATIVE
SPECIFIC GRAVITY, URINE: 1.016 (ref 1.001–1.035)
pH: 7 (ref 5.0–8.0)

## 2016-01-03 LAB — INSULIN, RANDOM: INSULIN: 32.6 u[IU]/mL — AB (ref 2.0–19.6)

## 2016-01-03 LAB — TESTOSTERONE: Testosterone: 1225 ng/dL — ABNORMAL HIGH (ref 250–827)

## 2016-01-03 LAB — MAGNESIUM: Magnesium: 2 mg/dL (ref 1.5–2.5)

## 2016-01-03 LAB — VITAMIN D 25 HYDROXY (VIT D DEFICIENCY, FRACTURES): Vit D, 25-Hydroxy: 82 ng/mL (ref 30–100)

## 2016-01-11 ENCOUNTER — Other Ambulatory Visit: Payer: Self-pay | Admitting: *Deleted

## 2016-01-11 DIAGNOSIS — Z0001 Encounter for general adult medical examination with abnormal findings: Secondary | ICD-10-CM

## 2016-01-11 DIAGNOSIS — Z1212 Encounter for screening for malignant neoplasm of rectum: Secondary | ICD-10-CM

## 2016-01-11 LAB — POC HEMOCCULT BLD/STL (HOME/3-CARD/SCREEN)
Card #2 Fecal Occult Blod, POC: NEGATIVE
FECAL OCCULT BLD: NEGATIVE
FECAL OCCULT BLD: NEGATIVE

## 2016-01-25 DIAGNOSIS — G4733 Obstructive sleep apnea (adult) (pediatric): Secondary | ICD-10-CM | POA: Diagnosis not present

## 2016-01-28 ENCOUNTER — Other Ambulatory Visit: Payer: Self-pay | Admitting: Internal Medicine

## 2016-02-19 DIAGNOSIS — C44612 Basal cell carcinoma of skin of right upper limb, including shoulder: Secondary | ICD-10-CM | POA: Diagnosis not present

## 2016-04-17 ENCOUNTER — Encounter: Payer: Self-pay | Admitting: Internal Medicine

## 2016-04-17 ENCOUNTER — Ambulatory Visit (INDEPENDENT_AMBULATORY_CARE_PROVIDER_SITE_OTHER): Payer: BLUE CROSS/BLUE SHIELD | Admitting: Internal Medicine

## 2016-04-17 VITALS — BP 146/88 | HR 62 | Temp 98.0°F | Resp 18 | Ht 65.5 in | Wt 215.0 lb

## 2016-04-17 DIAGNOSIS — R0989 Other specified symptoms and signs involving the circulatory and respiratory systems: Secondary | ICD-10-CM

## 2016-04-17 DIAGNOSIS — E782 Mixed hyperlipidemia: Secondary | ICD-10-CM | POA: Diagnosis not present

## 2016-04-17 DIAGNOSIS — K5732 Diverticulitis of large intestine without perforation or abscess without bleeding: Secondary | ICD-10-CM | POA: Diagnosis not present

## 2016-04-17 DIAGNOSIS — I10 Essential (primary) hypertension: Secondary | ICD-10-CM | POA: Diagnosis not present

## 2016-04-17 DIAGNOSIS — Z79899 Other long term (current) drug therapy: Secondary | ICD-10-CM | POA: Diagnosis not present

## 2016-04-17 DIAGNOSIS — R7303 Prediabetes: Secondary | ICD-10-CM | POA: Diagnosis not present

## 2016-04-17 DIAGNOSIS — E559 Vitamin D deficiency, unspecified: Secondary | ICD-10-CM

## 2016-04-17 DIAGNOSIS — E349 Endocrine disorder, unspecified: Secondary | ICD-10-CM | POA: Diagnosis not present

## 2016-04-17 DIAGNOSIS — G4733 Obstructive sleep apnea (adult) (pediatric): Secondary | ICD-10-CM | POA: Diagnosis not present

## 2016-04-17 LAB — MAGNESIUM: Magnesium: 2 mg/dL (ref 1.5–2.5)

## 2016-04-17 LAB — CBC WITH DIFFERENTIAL/PLATELET
Basophils Absolute: 67 cells/uL (ref 0–200)
Basophils Relative: 1 %
Eosinophils Absolute: 134 cells/uL (ref 15–500)
Eosinophils Relative: 2 %
HEMATOCRIT: 51.1 % — AB (ref 38.5–50.0)
HEMOGLOBIN: 17.4 g/dL — AB (ref 13.2–17.1)
LYMPHS ABS: 1474 {cells}/uL (ref 850–3900)
Lymphocytes Relative: 22 %
MCH: 32.2 pg (ref 27.0–33.0)
MCHC: 34.1 g/dL (ref 32.0–36.0)
MCV: 94.5 fL (ref 80.0–100.0)
MONO ABS: 804 {cells}/uL (ref 200–950)
MPV: 11.5 fL (ref 7.5–12.5)
Monocytes Relative: 12 %
NEUTROS ABS: 4221 {cells}/uL (ref 1500–7800)
NEUTROS PCT: 63 %
Platelets: 194 10*3/uL (ref 140–400)
RBC: 5.41 MIL/uL (ref 4.20–5.80)
RDW: 13 % (ref 11.0–15.0)
WBC: 6.7 10*3/uL (ref 3.8–10.8)

## 2016-04-17 LAB — TSH: TSH: 0.82 m[IU]/L (ref 0.40–4.50)

## 2016-04-17 LAB — HEPATIC FUNCTION PANEL
ALK PHOS: 46 U/L (ref 40–115)
ALT: 43 U/L (ref 9–46)
AST: 31 U/L (ref 10–35)
Albumin: 4.5 g/dL (ref 3.6–5.1)
BILIRUBIN DIRECT: 0.2 mg/dL (ref <=0.2)
BILIRUBIN INDIRECT: 0.7 mg/dL (ref 0.2–1.2)
TOTAL PROTEIN: 7.5 g/dL (ref 6.1–8.1)
Total Bilirubin: 0.9 mg/dL (ref 0.2–1.2)

## 2016-04-17 LAB — BASIC METABOLIC PANEL WITH GFR
BUN: 15 mg/dL (ref 7–25)
CALCIUM: 9.7 mg/dL (ref 8.6–10.3)
CHLORIDE: 99 mmol/L (ref 98–110)
CO2: 29 mmol/L (ref 20–31)
Creat: 1.06 mg/dL (ref 0.70–1.33)
GFR, EST NON AFRICAN AMERICAN: 77 mL/min (ref >=60)
GFR, Est African American: 89 mL/min (ref >=60)
GLUCOSE: 109 mg/dL — AB (ref 65–99)
POTASSIUM: 4.1 mmol/L (ref 3.5–5.3)
Sodium: 140 mmol/L (ref 135–146)

## 2016-04-17 LAB — LIPID PANEL
CHOL/HDL RATIO: 2.7 ratio (ref <5.0)
Cholesterol: 130 mg/dL (ref <200)
HDL: 48 mg/dL (ref >40)
LDL CALC: 65 mg/dL (ref <100)
Triglycerides: 87 mg/dL (ref <150)
VLDL: 17 mg/dL (ref <30)

## 2016-04-17 MED ORDER — CIPROFLOXACIN HCL 500 MG PO TABS: ORAL_TABLET | ORAL | 3 refills | Status: DC

## 2016-04-17 MED ORDER — AMLODIPINE BESYLATE 10 MG PO TABS: ORAL_TABLET | ORAL | 1 refills | Status: DC

## 2016-04-17 MED ORDER — METRONIDAZOLE 500 MG PO TABS: ORAL_TABLET | ORAL | 3 refills | Status: DC

## 2016-04-17 NOTE — Patient Instructions (Signed)
Recommend Adult Low Dose Aspirin or  coated  Aspirin 81 mg daily  To reduce risk of Colon Cancer 20 %,  Skin Cancer 26 % ,  Melanoma 46%  and  Pancreatic cancer 60% +++++++++++++++++++++++++ Vitamin D goal  is between 70-100.  Please make sure that you are taking your Vitamin D as directed.  It is very important as a natural anti-inflammatory  helping hair, skin, and nails, as well as reducing stroke and heart attack risk.  It helps your bones and helps with mood. It also decreases numerous cancer risks so please take it as directed.  Low Vit D is associated with a 200-300% higher risk for CANCER  and 200-300% higher risk for HEART   ATTACK  &  STROKE.   ...................................... It is also associated with higher death rate at younger ages,  autoimmune diseases like Rheumatoid arthritis, Lupus, Multiple Sclerosis.    Also many other serious conditions, like depression, Alzheimer's Dementia, infertility, muscle aches, fatigue, fibromyalgia - just to name a few. ++++++++++++++++++++ Recommend the book "The END of DIETING" by Dr Aydeen Fuhrman  & the book "The END of DIABETES " by Dr Jyren Fuhrman At [employer] - get book & Audio CD's    Being diabetic has a  300% increased risk for heart attack, stroke, cancer, and alzheimer- type vascular dementia. It is very important that you work harder with diet by avoiding all foods that are white. Avoid white rice (brown & wild rice is OK), white potatoes (sweetpotatoes in moderation is OK), White bread or wheat bread or anything made out of white flour like bagels, donuts, rolls, buns, biscuits, cakes, pastries, cookies, pizza crust, and pasta (made from white flour & egg whites) - vegetarian pasta or spinach or wheat pasta is OK. Multigrain breads like Arnold's or Pepperidge [employer], or multigrain sandwich thins or flatbreads.  Diet, exercise and weight loss can reverse and cure diabetes in the early stages.  Diet, exercise and weight loss is  very important in the control and prevention of complications of diabetes which affects every system in your body, ie. Brain - dementia/stroke, eyes - glaucoma/blindness, heart - heart attack/heart failure, kidneys - dialysis, stomach - gastric paralysis, intestines - malabsorption, nerves - severe painful neuritis, circulation - gangrene & loss of a leg(s), and finally cancer and Alzheimers.    I recommend avoid fried & greasy foods,  sweets/candy, white rice (brown or wild rice or Quinoa is OK), white potatoes (sweet potatoes are OK) - anything made from white flour - bagels, doughnuts, rolls, buns, biscuits,white and wheat breads, pizza crust and traditional pasta made of white flour & egg white(vegetarian pasta or spinach or wheat pasta is OK).  Multi-grain bread is OK - like multi-grain flat bread or sandwich thins. Avoid alcohol in excess. Exercise is also important.    Eat all the vegetables you want - avoid meat, especially red meat and dairy - especially cheese.  Cheese is the most concentrated form of trans-fats which is the worst thing to clog up our arteries. Veggie cheese is OK which can be found in the fresh produce section at [employer] or [employer] or [employer]  +++++++++++++++++++++ DASH Eating Plan  DASH stands for "Dietary Approaches to Stop Hypertension."   The DASH eating plan is a healthy eating plan that has been shown to reduce high blood pressure (hypertension). Additional health benefits may include reducing the risk of type 2 diabetes mellitus, heart disease, and stroke. The DASH eating plan may also   help with weight loss. WHAT DO I NEED TO KNOW ABOUT THE DASH EATING PLAN? For the DASH eating plan, you will follow these general guidelines:  Choose foods with a percent daily value for sodium of less than 5% (as listed on the food label).  Use salt-free seasonings or herbs instead of table salt or sea salt.  Check with your health care provider or pharmacist before  using salt substitutes.  Eat lower-sodium products, often labeled as "lower sodium" or "no salt added."  Eat fresh foods.  Eat more vegetables, fruits, and low-fat dairy products.  Choose whole grains. Look for the word "whole" as the first word in the ingredient list.  Choose fish   Limit sweets, desserts, sugars, and sugary drinks.  Choose heart-healthy fats.  Eat veggie cheese   Eat more home-cooked food and less restaurant, buffet, and fast food.  Limit fried foods.  Cook foods using methods other than frying.  Limit canned vegetables. If you do use them, rinse them well to decrease the sodium.  When eating at a restaurant, ask that your food be prepared with less salt, or no salt if possible.                      WHAT FOODS CAN I EAT? Read Dr Geofrey Fuhrman's books on The End of Dieting & The End of Diabetes  Grains Whole grain or whole wheat bread. Brown rice. Whole grain or whole wheat pasta. Quinoa, bulgur, and whole grain cereals. Low-sodium cereals. Corn or whole wheat flour tortillas. Whole grain cornbread. Whole grain crackers. Low-sodium crackers.  Vegetables Fresh or frozen vegetables (raw, steamed, roasted, or grilled). Low-sodium or reduced-sodium tomato and vegetable juices. Low-sodium or reduced-sodium tomato sauce and paste. Low-sodium or reduced-sodium canned vegetables.   Fruits All fresh, canned (in natural juice), or frozen fruits.  Protein Products  All fish and seafood.  Dried beans, peas, or lentils. Unsalted nuts and seeds. Unsalted canned beans.  Dairy Low-fat dairy products, such as skim or 1% milk, 2% or reduced-fat cheeses, low-fat ricotta or cottage cheese, or plain low-fat yogurt. Low-sodium or reduced-sodium cheeses.  Fats and Oils Tub margarines without trans fats. Light or reduced-fat mayonnaise and salad dressings (reduced sodium). Avocado. Safflower, olive, or canola oils. Natural peanut or almond butter.  Other Unsalted popcorn  and pretzels. The items listed above may not be a complete list of recommended foods or beverages. Contact your dietitian for more options.  +++++++++++++++  WHAT FOODS ARE NOT RECOMMENDED? Grains/ White flour or wheat flour White bread. White pasta. White rice. Refined cornbread. Bagels and croissants. Crackers that contain trans fat.  Vegetables  Creamed or fried vegetables. Vegetables in a . Regular canned vegetables. Regular canned tomato sauce and paste. Regular tomato and vegetable juices.  Fruits Dried fruits. Canned fruit in light or heavy syrup. Fruit juice.  Meat and Other Protein Products Meat in general - RED meat & White meat.  Fatty cuts of meat. Ribs, chicken wings, all processed meats as bacon, sausage, bologna, salami, fatback, hot dogs, bratwurst and packaged luncheon meats.  Dairy Whole or 2% milk, cream, half-and-half, and cream cheese. Whole-fat or sweetened yogurt. Full-fat cheeses or blue cheese. Non-dairy creamers and whipped toppings. Processed cheese, cheese spreads, or cheese curds.  Condiments Onion and garlic salt, seasoned salt, table salt, and sea salt. Canned and packaged gravies. Worcestershire sauce. Tartar sauce. Barbecue sauce. Teriyaki sauce. Soy sauce, including reduced sodium. Steak sauce. Fish sauce. Oyster sauce. Cocktail   sauce. Horseradish. Ketchup and mustard. Meat flavorings and tenderizers. Bouillon cubes. Hot sauce. Tabasco sauce. Marinades. Taco seasonings. Relishes.  Fats and Oils Butter, stick margarine, lard, shortening and bacon fat. Coconut, palm kernel, or palm oils. Regular salad dressings.  Pickles and olives. Salted popcorn and pretzels.  The items listed above may not be a complete list of foods and beverages to avoid.   
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
## 2016-04-18 LAB — HEMOGLOBIN A1C
HEMOGLOBIN A1C: 5.7 % — AB (ref <5.7)
MEAN PLASMA GLUCOSE: 117 mg/dL

## 2016-04-18 LAB — INSULIN, RANDOM: Insulin: 23.8 u[IU]/mL — ABNORMAL HIGH (ref 2.0–19.6)

## 2016-04-18 LAB — TESTOSTERONE: Testosterone: 1303 ng/dL — ABNORMAL HIGH (ref 250–827)

## 2016-04-18 LAB — VITAMIN D 25 HYDROXY (VIT D DEFICIENCY, FRACTURES): Vit D, 25-Hydroxy: 98 ng/mL (ref 30–100)

## 2016-05-01 ENCOUNTER — Ambulatory Visit (INDEPENDENT_AMBULATORY_CARE_PROVIDER_SITE_OTHER): Payer: BLUE CROSS/BLUE SHIELD | Admitting: Podiatry

## 2016-05-01 ENCOUNTER — Ambulatory Visit (INDEPENDENT_AMBULATORY_CARE_PROVIDER_SITE_OTHER): Payer: BLUE CROSS/BLUE SHIELD

## 2016-05-01 ENCOUNTER — Encounter: Payer: Self-pay | Admitting: Podiatry

## 2016-05-01 VITALS — Resp 16 | Ht 65.0 in | Wt 195.0 lb

## 2016-05-01 DIAGNOSIS — M722 Plantar fascial fibromatosis: Secondary | ICD-10-CM | POA: Diagnosis not present

## 2016-05-01 MED ORDER — TRIAMCINOLONE ACETONIDE 10 MG/ML IJ SUSP
10.0000 mg | Freq: Once | INTRAMUSCULAR | Status: AC
  Administered 2016-05-01: 10 mg

## 2016-05-01 NOTE — Patient Instructions (Signed)

## 2016-05-01 NOTE — Progress Notes (Signed)
   Subjective:    Patient ID: [name], male    DOB: 1961/01/23, 56 y.o.   MRN: [id number]  HPI  Chief Complaint  Patient presents with  . Foot Pain    Left; Bottom of heel x 3 weeks.        Review of Systems     Objective:   Physical Exam        Assessment & Plan:

## 2016-05-02 NOTE — Progress Notes (Signed)
Subjective:     Patient ID: [name], male   DOB: 1960/10/12, 56 y.o.   MRN: [id number]  HPI patient presents stating I am developing a lot of pain in my left heel over the last few weeks and I have had tremendous problems with knee with several replacements which is not been successful on my left knee   Review of Systems  All other systems reviewed and are negative.      Objective:   Physical Exam  Constitutional: He is oriented to person, place, and time.  Musculoskeletal: Normal range of motion.  Neurological: He is oriented to person, place, and time.  Skin: Skin is warm.  Nursing note and vitals reviewed.  Neurovascular status is found to be intact muscle strength was adequate range of motion within normal limits with patient found to have exquisite discomfort in the left plantar heel at the insertional point of the tendon into the calcaneus with inflammation fluid around the medial band. Patient is moderate depression of the arch and chronic problems with foot and knees     Assessment:     Acute plantar fasciitis left with patient having other problems creating change in gait    Plan:     H&P conditions reviewed and plantar injection administered 3 mg Kenalog 5 mg Xylocaine to the fascial insertion. Applied fascial brace with instructions and instructed on good shoe gear support and not going barefoot and reappoint to recheck in the next several weeks  X-David report indicates that there is small spur but no indication stress fracture arthritis

## 2016-05-08 DIAGNOSIS — G4733 Obstructive sleep apnea (adult) (pediatric): Secondary | ICD-10-CM | POA: Diagnosis not present

## 2016-05-09 DIAGNOSIS — G4733 Obstructive sleep apnea (adult) (pediatric): Secondary | ICD-10-CM | POA: Diagnosis not present

## 2016-05-15 ENCOUNTER — Ambulatory Visit (INDEPENDENT_AMBULATORY_CARE_PROVIDER_SITE_OTHER): Payer: BLUE CROSS/BLUE SHIELD | Admitting: Internal Medicine

## 2016-05-15 VITALS — BP 134/84 | HR 72 | Temp 97.5°F | Resp 16 | Ht 65.0 in | Wt 214.2 lb

## 2016-05-15 DIAGNOSIS — I10 Essential (primary) hypertension: Secondary | ICD-10-CM | POA: Diagnosis not present

## 2016-05-15 DIAGNOSIS — R0989 Other specified symptoms and signs involving the circulatory and respiratory systems: Secondary | ICD-10-CM

## 2016-05-16 ENCOUNTER — Encounter: Payer: Self-pay | Admitting: Podiatry

## 2016-05-16 ENCOUNTER — Ambulatory Visit (INDEPENDENT_AMBULATORY_CARE_PROVIDER_SITE_OTHER): Payer: BLUE CROSS/BLUE SHIELD | Admitting: Podiatry

## 2016-05-16 DIAGNOSIS — M722 Plantar fascial fibromatosis: Secondary | ICD-10-CM | POA: Diagnosis not present

## 2016-05-18 ENCOUNTER — Encounter: Payer: Self-pay | Admitting: Internal Medicine

## 2016-05-18 DIAGNOSIS — M722 Plantar fascial fibromatosis: Secondary | ICD-10-CM

## 2016-05-18 MED ORDER — TRIAMCINOLONE ACETONIDE 10 MG/ML IJ SUSP
10.0000 mg | Freq: Once | INTRAMUSCULAR | Status: AC
  Administered 2016-05-18: 10 mg

## 2016-05-18 NOTE — Progress Notes (Signed)
Subjective:     Patient ID: [name], male   DOB: 1960/10/03, 56 y.o.   MRN: [id number]  HPI patient states the pain has improved some but still having pain and especially has pain after periods of activity   Review of Systems     Objective:   Physical Exam Neurovascular status intact with discomfort plantar heel left that's improved but is still present when palpated with depression of arch noted    Assessment:     Mechanical dysfunction with inflammation of the heel along with pain in the fascia itself    Plan:     Casted for a early type orthotic at this time with deep heel seat to control motion and injected the left plantar fashion 3 mg Kenalog 5 mg Xylocaine
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
## 2016-05-19 DIAGNOSIS — H04123 Dry eye syndrome of bilateral lacrimal glands: Secondary | ICD-10-CM | POA: Diagnosis not present

## 2016-06-05 ENCOUNTER — Other Ambulatory Visit: Payer: BLUE CROSS/BLUE SHIELD

## 2016-06-23 DIAGNOSIS — G4733 Obstructive sleep apnea (adult) (pediatric): Secondary | ICD-10-CM | POA: Diagnosis not present

## 2016-06-24 ENCOUNTER — Encounter: Payer: Self-pay | Admitting: Adult Health

## 2016-06-24 ENCOUNTER — Ambulatory Visit (INDEPENDENT_AMBULATORY_CARE_PROVIDER_SITE_OTHER): Payer: BLUE CROSS/BLUE SHIELD | Admitting: Adult Health

## 2016-06-24 ENCOUNTER — Ambulatory Visit: Payer: BLUE CROSS/BLUE SHIELD | Admitting: Adult Health

## 2016-06-24 ENCOUNTER — Ambulatory Visit: Payer: BLUE CROSS/BLUE SHIELD | Admitting: Pulmonary Disease

## 2016-06-24 DIAGNOSIS — G4733 Obstructive sleep apnea (adult) (pediatric): Secondary | ICD-10-CM

## 2016-06-24 NOTE — Patient Instructions (Signed)
Keep up the good work. Wear C Pap each night. Do not drive if sleepy Work on weight loss. Follow-up in one year with Dr. Elsworth Soho  And As needed

## 2016-06-24 NOTE — Progress Notes (Signed)
@Patient  ID: [name], male    DOB: [birth year], 58 y.o.   MRN: [id number]  Chief Complaint  Patient presents with  . Follow-up    one year follow up.    Referring provider: [name], MD  HPI: 56 year old male followed for severe sleep apnea on CPAP   TEST  HST 11/2012:  AHI 37/hr with desat to 72%  06/24/2016 Follow up : OSA  Patient returns for a 1 year follow-up. Patient is on [name] at bedtime for severe sleep apnea. Patient says he is doing very well. He feels rested without significant daytime sleepiness. Download shows excellent compliance with average usage at 7 hours. He is on a set [name] pressure at 10 cm H2O. AHI is 0.5. Minimal leaks. Patient says he is very active and exercises on a regular basis  Allergies  Allergen Reactions  . Penicillins Anaphylaxis  . Ace Inhibitors Other (See Comments)    unknown  . Keflex [Cephalexin] Hives  . Pregabalin     Other reaction(s): Confusion (intolerance)    Immunization History  Administered Date(s) Administered  . DT 08/02/2006  . Influenza Split 11/30/2013, 12/06/2014  . Influenza Whole 12/02/2011  . Influenza,inj,Quad PF,36+ Mos 11/01/2012  . Influenza,inj,quad, With Preservative 01/02/2016  . PPD Test 11/30/2013, 12/06/2014, 01/02/2016    Past Medical History:  Diagnosis Date  . Allergy   . Anxiety   . Arthritis   . Chronic pain of left knee   . Diverticulitis   . Hyperlipidemia   . IBS (irritable bowel syndrome)   . Kidney stones   . Labile hypertension   . Other testicular hypofunction   . Prediabetes   . Sleep apnea    uses CPAP  . Vitamin D deficiency     Tobacco History: History  Smoking Status  . Former Smoker  . Packs/day: 2.00  . Years: 12.00  . Quit date: 03/03/1994  Smokeless Tobacco  . Former Systems developer  . Types: Snuff    Comment: quit smoking 17 years ago   Counseling given: Not Answered   Outpatient Encounter Prescriptions as of 06/24/2016  Medication Sig  . amLODipine  (NORVASC) 10 MG tablet Take 1/2 to 1 tablet daily as directed for BP  . atorvastatin (LIPITOR) 80 MG tablet take 1 tablet by mouth once daily for cholesterol  . B-D 3CC LUER-LOK SYR 21GX1" 21G X 1" 3 ML MISC use as directed  . testosterone cypionate (DEPOTESTOSTERONE CYPIONATE) 200 MG/ML injection inject 1 milliliters intramuscularly every  weeks  . VITAMIN D, CHOLECALCIFEROL, PO Take 10,000 mg by mouth daily.  . clonazePAM (KLONOPIN) 1 MG tablet take 1/2-1 tablets by mouth three times a day if needed for anxiety  . nitroGLYCERIN (NITROSTAT) 0.4 MG SL tablet take 1 tablet under the tongue EVERY 3 TO 5 MINUTES if needed for chest pain   No facility-administered encounter medications on file as of 06/24/2016.      Review of Systems  Constitutional:   No  weight loss, night sweats,  Fevers, chills, fatigue, or  lassitude.  HEENT:   No headaches,  Difficulty swallowing,  Tooth/dental problems, or  Sore throat,                No sneezing, itching, ear ache, nasal congestion, post nasal drip,   CV:  No chest pain,  Orthopnea, PND, swelling in lower extremities, anasarca, dizziness, palpitations, syncope.   GI  No heartburn, indigestion, abdominal pain, nausea, vomiting, diarrhea, change in bowel habits, loss  of appetite, bloody stools.   Resp: No shortness of breath with exertion or at rest.  No excess mucus, no productive cough,  No non-productive cough,  No coughing up of blood.  No change in color of mucus.  No wheezing.  No chest wall deformity  Skin: no rash or lesions.  GU: no dysuria, change in color of urine, no urgency or frequency.  No flank pain, no hematuria        Physical Exam  BP (!) 128/58 (BP Location: Right Arm, Cuff Size: Normal)   Pulse 88   Resp 18   Ht 5\' 6"  (1.676 m)   Wt 215 lb 6.4 oz (97.7 kg)   SpO2 98%   BMI 34.77 kg/m   GEN: A/Ox3; pleasant , NAD, obese    HEENT:  [place]/AT,  EACs-clear, TMs-wnl, NOSE-clear, THROAT-clear, no lesions, no postnasal drip or  exudate noted. Class 2-3 MP airway   NECK:  Supple w/ fair ROM; no JVD; normal carotid impulses w/o bruits; no thyromegaly or nodules palpated; no lymphadenopathy.    RESP  Clear  P & A; w/o, wheezes/ rales/ or rhonchi. no accessory muscle use, no dullness to percussion  CARD:  RRR, no m/r/g, no peripheral edema, pulses intact, no cyanosis or clubbing.  GI:   Soft & nt; nml bowel sounds; no organomegaly or masses detected.   Musco: Warm bil, no deformities or joint swelling noted.   Neuro: alert, no focal deficits noted.    Skin: Warm, no lesions or rashes   Lab Results:  BNP No results found for: BNP  ProBNP No results found for: PROBNP  Imaging: No results found.   Assessment & Plan:   OSA (obstructive sleep apnea) Excellent control of severe sleep apnea on [name]  Plan  . Patient Instructions  Keep up the good work. Wear [name] each night. Do not drive if sleepy Work on weight loss. Follow-up in one year with Dr. Elsworth Soho  And As needed      Morbid obesity ([place])  (IBMI 35.48)  Wt loss      Rexene Edison, NP 06/24/2016

## 2016-06-24 NOTE — Assessment & Plan Note (Signed)
Wt loss  

## 2016-06-24 NOTE — Assessment & Plan Note (Signed)
Excellent control of severe sleep apnea on C Pap  Plan  . Patient Instructions  Keep up the good work. Wear C Pap each night. Do not drive if sleepy Work on weight loss. Follow-up in one year with Dr. Elsworth Soho  And As needed

## 2016-06-30 NOTE — Progress Notes (Signed)
Reviewed & agree with plan  

## 2016-07-08 DIAGNOSIS — D2372 Other benign neoplasm of skin of left lower limb, including hip: Secondary | ICD-10-CM | POA: Diagnosis not present

## 2016-07-08 DIAGNOSIS — D2272 Melanocytic nevi of left lower limb, including hip: Secondary | ICD-10-CM | POA: Diagnosis not present

## 2016-07-08 DIAGNOSIS — D225 Melanocytic nevi of trunk: Secondary | ICD-10-CM | POA: Diagnosis not present

## 2016-07-08 DIAGNOSIS — D2362 Other benign neoplasm of skin of left upper limb, including shoulder: Secondary | ICD-10-CM | POA: Diagnosis not present

## 2016-07-15 ENCOUNTER — Other Ambulatory Visit: Payer: Self-pay | Admitting: *Deleted

## 2016-07-15 ENCOUNTER — Ambulatory Visit (INDEPENDENT_AMBULATORY_CARE_PROVIDER_SITE_OTHER): Payer: BLUE CROSS/BLUE SHIELD | Admitting: Internal Medicine

## 2016-07-15 ENCOUNTER — Encounter: Payer: Self-pay | Admitting: Internal Medicine

## 2016-07-15 VITALS — BP 132/76 | HR 76 | Temp 97.5°F | Resp 16 | Ht 65.5 in | Wt 213.4 lb

## 2016-07-15 DIAGNOSIS — E559 Vitamin D deficiency, unspecified: Secondary | ICD-10-CM

## 2016-07-15 DIAGNOSIS — R0989 Other specified symptoms and signs involving the circulatory and respiratory systems: Secondary | ICD-10-CM

## 2016-07-15 DIAGNOSIS — L820 Inflamed seborrheic keratosis: Secondary | ICD-10-CM

## 2016-07-15 DIAGNOSIS — L918 Other hypertrophic disorders of the skin: Secondary | ICD-10-CM

## 2016-07-15 DIAGNOSIS — R7303 Prediabetes: Secondary | ICD-10-CM

## 2016-07-15 DIAGNOSIS — E349 Endocrine disorder, unspecified: Secondary | ICD-10-CM

## 2016-07-15 DIAGNOSIS — I10 Essential (primary) hypertension: Secondary | ICD-10-CM | POA: Diagnosis not present

## 2016-07-15 DIAGNOSIS — Z79899 Other long term (current) drug therapy: Secondary | ICD-10-CM

## 2016-07-15 DIAGNOSIS — E782 Mixed hyperlipidemia: Secondary | ICD-10-CM

## 2016-07-15 LAB — CBC WITH DIFFERENTIAL/PLATELET
BASOS PCT: 1 %
Basophils Absolute: 68 cells/uL (ref 0–200)
EOS PCT: 2 %
Eosinophils Absolute: 136 cells/uL (ref 15–500)
HCT: 48.7 % (ref 38.5–50.0)
Hemoglobin: 17 g/dL (ref 13.2–17.1)
Lymphocytes Relative: 22 %
Lymphs Abs: 1496 cells/uL (ref 850–3900)
MCH: 32.5 pg (ref 27.0–33.0)
MCHC: 34.9 g/dL (ref 32.0–36.0)
MCV: 93.1 fL (ref 80.0–100.0)
MONOS PCT: 11 %
MPV: 10.9 fL (ref 7.5–12.5)
Monocytes Absolute: 748 cells/uL (ref 200–950)
NEUTROS ABS: 4352 {cells}/uL (ref 1500–7800)
Neutrophils Relative %: 64 %
PLATELETS: 230 10*3/uL (ref 140–400)
RBC: 5.23 MIL/uL (ref 4.20–5.80)
RDW: 13.1 % (ref 11.0–15.0)
WBC: 6.8 10*3/uL (ref 3.8–10.8)

## 2016-07-15 LAB — BASIC METABOLIC PANEL WITH GFR
BUN: 11 mg/dL (ref 7–25)
CHLORIDE: 102 mmol/L (ref 98–110)
CO2: 26 mmol/L (ref 20–31)
Calcium: 9.8 mg/dL (ref 8.6–10.3)
Creat: 1.01 mg/dL (ref 0.70–1.33)
GFR, Est Non African American: 82 mL/min (ref >=60)
Glucose, Bld: 99 mg/dL (ref 65–99)
POTASSIUM: 4 mmol/L (ref 3.5–5.3)
SODIUM: 139 mmol/L (ref 135–146)

## 2016-07-15 LAB — LIPID PANEL
Cholesterol: 149 mg/dL (ref <200)
HDL: 49 mg/dL (ref >40)
LDL Cholesterol: 79 mg/dL (ref <100)
TRIGLYCERIDES: 105 mg/dL (ref <150)
Total CHOL/HDL Ratio: 3 Ratio (ref <5.0)
VLDL: 21 mg/dL (ref <30)

## 2016-07-15 LAB — HEPATIC FUNCTION PANEL
ALK PHOS: 56 U/L (ref 40–115)
ALT: 42 U/L (ref 9–46)
AST: 38 U/L — AB (ref 10–35)
Albumin: 4.5 g/dL (ref 3.6–5.1)
BILIRUBIN DIRECT: 0.2 mg/dL (ref <=0.2)
BILIRUBIN TOTAL: 1 mg/dL (ref 0.2–1.2)
Indirect Bilirubin: 0.8 mg/dL (ref 0.2–1.2)
Total Protein: 7.4 g/dL (ref 6.1–8.1)

## 2016-07-15 LAB — TSH: TSH: 0.85 mIU/L (ref 0.40–4.50)

## 2016-07-15 LAB — MAGNESIUM: MAGNESIUM: 2 mg/dL (ref 1.5–2.5)

## 2016-07-15 MED ORDER — TESTOSTERONE CYPIONATE 200 MG/ML IM SOLN: INTRAMUSCULAR | 3 refills | Status: DC
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
## 2016-07-15 NOTE — Patient Instructions (Signed)
Recommend Adult Low Dose Aspirin or  coated  Aspirin 81 mg daily  To reduce risk of Colon Cancer 20 %,  Skin Cancer 26 % ,  Melanoma 46%  and  Pancreatic cancer 60% +++++++++++++++++++++++++ Vitamin D goal  is between 70-100.  Please make sure that you are taking your Vitamin D as directed.  It is very important as a natural anti-inflammatory  helping hair, skin, and nails, as well as reducing stroke and heart attack risk.  It helps your bones and helps with mood. It also decreases numerous cancer risks so please take it as directed.  Low Vit D is associated with a 200-300% higher risk for CANCER  and 200-300% higher risk for HEART   ATTACK  &  STROKE.   ...................................... It is also associated with higher death rate at younger ages,  autoimmune diseases like Rheumatoid arthritis, Lupus, Multiple Sclerosis.    Also many other serious conditions, like depression, Alzheimer's Dementia, infertility, muscle aches, fatigue, fibromyalgia - just to name a few. ++++++++++++++++++++ Recommend the book "The END of DIETING" by Dr Yama Fuhrman  & the book "The END of DIABETES " by Dr Nickoles Fuhrman At [employer] - get book & Audio CD's    Being diabetic has a  300% increased risk for heart attack, stroke, cancer, and alzheimer- type vascular dementia. It is very important that you work harder with diet by avoiding all foods that are white. Avoid white rice (brown & wild rice is OK), white potatoes (sweetpotatoes in moderation is OK), White bread or wheat bread or anything made out of white flour like bagels, donuts, rolls, buns, biscuits, cakes, pastries, cookies, pizza crust, and pasta (made from white flour & egg whites) - vegetarian pasta or spinach or wheat pasta is OK. Multigrain breads like Arnold's or Pepperidge [employer], or multigrain sandwich thins or flatbreads.  Diet, exercise and weight loss can reverse and cure diabetes in the early stages.  Diet, exercise and weight loss is  very important in the control and prevention of complications of diabetes which affects every system in your body, ie. Brain - dementia/stroke, eyes - glaucoma/blindness, heart - heart attack/heart failure, kidneys - dialysis, stomach - gastric paralysis, intestines - malabsorption, nerves - severe painful neuritis, circulation - gangrene & loss of a leg(s), and finally cancer and Alzheimers.    I recommend avoid fried & greasy foods,  sweets/candy, white rice (brown or wild rice or Quinoa is OK), white potatoes (sweet potatoes are OK) - anything made from white flour - bagels, doughnuts, rolls, buns, biscuits,white and wheat breads, pizza crust and traditional pasta made of white flour & egg white(vegetarian pasta or spinach or wheat pasta is OK).  Multi-grain bread is OK - like multi-grain flat bread or sandwich thins. Avoid alcohol in excess. Exercise is also important.    Eat all the vegetables you want - avoid meat, especially red meat and dairy - especially cheese.  Cheese is the most concentrated form of trans-fats which is the worst thing to clog up our arteries. Veggie cheese is OK which can be found in the fresh produce section at [employer] or [employer] or [employer]  +++++++++++++++++++++ DASH Eating Plan  DASH stands for "Dietary Approaches to Stop Hypertension."   The DASH eating plan is a healthy eating plan that has been shown to reduce high blood pressure (hypertension). Additional health benefits may include reducing the risk of type 2 diabetes mellitus, heart disease, and stroke. The DASH eating plan may also   help with weight loss. WHAT DO I NEED TO KNOW ABOUT THE DASH EATING PLAN? For the DASH eating plan, you will follow these general guidelines:  Choose foods with a percent daily value for sodium of less than 5% (as listed on the food label).  Use salt-free seasonings or herbs instead of table salt or sea salt.  Check with your health care provider or pharmacist before  using salt substitutes.  Eat lower-sodium products, often labeled as "lower sodium" or "no salt added."  Eat fresh foods.  Eat more vegetables, fruits, and low-fat dairy products.  Choose whole grains. Look for the word "whole" as the first word in the ingredient list.  Choose fish   Limit sweets, desserts, sugars, and sugary drinks.  Choose heart-healthy fats.  Eat veggie cheese   Eat more home-cooked food and less restaurant, buffet, and fast food.  Limit fried foods.  Cook foods using methods other than frying.  Limit canned vegetables. If you do use them, rinse them well to decrease the sodium.  When eating at a restaurant, ask that your food be prepared with less salt, or no salt if possible.                      WHAT FOODS CAN I EAT? Read Dr Yechezkel Fuhrman's books on The End of Dieting & The End of Diabetes  Grains Whole grain or whole wheat bread. Brown rice. Whole grain or whole wheat pasta. Quinoa, bulgur, and whole grain cereals. Low-sodium cereals. Corn or whole wheat flour tortillas. Whole grain cornbread. Whole grain crackers. Low-sodium crackers.  Vegetables Fresh or frozen vegetables (raw, steamed, roasted, or grilled). Low-sodium or reduced-sodium tomato and vegetable juices. Low-sodium or reduced-sodium tomato sauce and paste. Low-sodium or reduced-sodium canned vegetables.   Fruits All fresh, canned (in natural juice), or frozen fruits.  Protein Products  All fish and seafood.  Dried beans, peas, or lentils. Unsalted nuts and seeds. Unsalted canned beans.  Dairy Low-fat dairy products, such as skim or 1% milk, 2% or reduced-fat cheeses, low-fat ricotta or cottage cheese, or plain low-fat yogurt. Low-sodium or reduced-sodium cheeses.  Fats and Oils Tub margarines without trans fats. Light or reduced-fat mayonnaise and salad dressings (reduced sodium). Avocado. Safflower, olive, or canola oils. Natural peanut or almond butter.  Other Unsalted popcorn  and pretzels. The items listed above may not be a complete list of recommended foods or beverages. Contact your dietitian for more options.  +++++++++++++++  WHAT FOODS ARE NOT RECOMMENDED? Grains/ White flour or wheat flour White bread. White pasta. White rice. Refined cornbread. Bagels and croissants. Crackers that contain trans fat.  Vegetables  Creamed or fried vegetables. Vegetables in a . Regular canned vegetables. Regular canned tomato sauce and paste. Regular tomato and vegetable juices.  Fruits Dried fruits. Canned fruit in light or heavy syrup. Fruit juice.  Meat and Other Protein Products Meat in general - RED meat & White meat.  Fatty cuts of meat. Ribs, chicken wings, all processed meats as bacon, sausage, bologna, salami, fatback, hot dogs, bratwurst and packaged luncheon meats.  Dairy Whole or 2% milk, cream, half-and-half, and cream cheese. Whole-fat or sweetened yogurt. Full-fat cheeses or blue cheese. Non-dairy creamers and whipped toppings. Processed cheese, cheese spreads, or cheese curds.  Condiments Onion and garlic salt, seasoned salt, table salt, and sea salt. Canned and packaged gravies. Worcestershire sauce. Tartar sauce. Barbecue sauce. Teriyaki sauce. Soy sauce, including reduced sodium. Steak sauce. Fish sauce. Oyster sauce. Cocktail   sauce. Horseradish. Ketchup and mustard. Meat flavorings and tenderizers. Bouillon cubes. Hot sauce. Tabasco sauce. Marinades. Taco seasonings. Relishes.  Fats and Oils Butter, stick margarine, lard, shortening and bacon fat. Coconut, palm kernel, or palm oils. Regular salad dressings.  Pickles and olives. Salted popcorn and pretzels.  The items listed above may not be a complete list of foods and beverages to avoid.   

## 2016-07-16 LAB — HEMOGLOBIN A1C
HEMOGLOBIN A1C: 5.5 % (ref <5.7)
Mean Plasma Glucose: 111 mg/dL

## 2016-07-16 LAB — TESTOSTERONE: TESTOSTERONE: 1307 ng/dL — AB (ref 250–827)

## 2016-07-16 LAB — INSULIN, RANDOM: Insulin: 12 u[IU]/mL (ref 2.0–19.6)

## 2016-07-16 LAB — VITAMIN D 25 HYDROXY (VIT D DEFICIENCY, FRACTURES): Vit D, 25-Hydroxy: 83 ng/mL (ref 30–100)

## 2016-07-23 DIAGNOSIS — G4733 Obstructive sleep apnea (adult) (pediatric): Secondary | ICD-10-CM | POA: Diagnosis not present

## 2016-07-30 ENCOUNTER — Other Ambulatory Visit: Payer: Self-pay | Admitting: *Deleted

## 2016-07-30 MED ORDER — SILDENAFIL CITRATE 20 MG PO TABS
ORAL_TABLET | ORAL | 3 refills | Status: DC
Start: 2016-07-30 — End: 2017-08-24

## 2016-08-08 ENCOUNTER — Ambulatory Visit (INDEPENDENT_AMBULATORY_CARE_PROVIDER_SITE_OTHER): Payer: BLUE CROSS/BLUE SHIELD | Admitting: Podiatry

## 2016-08-08 ENCOUNTER — Encounter: Payer: Self-pay | Admitting: Podiatry

## 2016-08-08 DIAGNOSIS — M722 Plantar fascial fibromatosis: Secondary | ICD-10-CM | POA: Diagnosis not present

## 2016-08-08 MED ORDER — TRIAMCINOLONE ACETONIDE 10 MG/ML IJ SUSP
10.0000 mg | Freq: Once | INTRAMUSCULAR | Status: AC
  Administered 2016-08-08: 10 mg

## 2016-08-08 NOTE — Progress Notes (Signed)
Subjective:    Patient ID: [name], male   DOB: 56 y.o.   MRN: [id number]   HPI patient presents stating his let  Heel is still killing him and he is walking on a swollen inflamed aea    ROS      Objective:  Physical Exam neurovascular Intact with patient noted to have acute inflammation plantar aspect left heel with inability to bear weight against the area     Assessment:    Acute plantar fasciitis left with inflammation fluid around the medial band it's not responded so far to conservative care     Plan:  Reinjected the plantar fascial left 3 mg Kenalog 5 mg Xylocaine and placed an air fracture walker and order to completely immobilize the plantar heel and take stress off

## 2016-08-22 DIAGNOSIS — G4733 Obstructive sleep apnea (adult) (pediatric): Secondary | ICD-10-CM | POA: Diagnosis not present

## 2016-08-29 ENCOUNTER — Encounter: Payer: Self-pay | Admitting: Podiatry

## 2016-08-29 ENCOUNTER — Ambulatory Visit (INDEPENDENT_AMBULATORY_CARE_PROVIDER_SITE_OTHER): Payer: BLUE CROSS/BLUE SHIELD | Admitting: Podiatry

## 2016-08-29 DIAGNOSIS — M722 Plantar fascial fibromatosis: Secondary | ICD-10-CM | POA: Diagnosis not present

## 2016-08-29 NOTE — Progress Notes (Signed)
Subjective:    Patient ID: [name], male   DOB: 56 y.o.   MRN: [id number]   HPI patient states that the left heel still feels like there is a Speight going through it and then when wearing the boot it feels somewhat better but patient still has a lot of discomfort in the plantar heel    ROS      Objective:  Physical Exam neurovascular status intact with exquisite discomfort plantar fascial left at the insertional point tendon into the calcaneus around the medial band     Assessment:    Acute plantar fasciitis left     Plan:    Discussed condition and at this point I recommended shockwave therapy. Discussed shockwave explaining to him and the fact it may not solve the problem he may and requiring open surgery. Patient wants shockwave therapy and is scheduled for this to be done at this time

## 2016-09-09 ENCOUNTER — Ambulatory Visit (INDEPENDENT_AMBULATORY_CARE_PROVIDER_SITE_OTHER): Payer: Self-pay

## 2016-09-09 DIAGNOSIS — M722 Plantar fascial fibromatosis: Secondary | ICD-10-CM

## 2016-09-09 DIAGNOSIS — L603 Nail dystrophy: Secondary | ICD-10-CM

## 2016-09-09 NOTE — Progress Notes (Signed)
Pt presents with ongoing pain in his left heel, he has tried various treatments with no success. He states that his heel has hurt for several months, he has been wearing a boot and he says the boot has helped  Pain on palpation of Lt medial heel band  ESWT therapy administered to Lt heel at 14.75 joules and tolerated well. EPAT delivered to surrounding connective tissues. Advised on usage of boot and avoidance of NSAIDs and ice. Re-appointed in 1 week for 2nd treatment

## 2016-09-17 ENCOUNTER — Ambulatory Visit: Payer: BLUE CROSS/BLUE SHIELD

## 2016-09-18 ENCOUNTER — Ambulatory Visit (INDEPENDENT_AMBULATORY_CARE_PROVIDER_SITE_OTHER): Payer: BLUE CROSS/BLUE SHIELD | Admitting: Podiatry

## 2016-09-18 DIAGNOSIS — M722 Plantar fascial fibromatosis: Secondary | ICD-10-CM

## 2016-09-18 DIAGNOSIS — B351 Tinea unguium: Secondary | ICD-10-CM

## 2016-09-18 NOTE — Progress Notes (Signed)
Pt presents with ongoing pain in his left heel, he has tried various treatments with no success. He states thathe has noticed a slight improvement in pain, he is still able to do martial arts and golf  Pain on palpation of Lt medial heel band  ESWT therapy administered to Lt heel at 16 joules and tolerated well. EPAT delivered to surrounding connective tissues. Advised on usage of boot and avoidance of NSAIDs and ice. Re-appointed in 1 week for 3rd treatment

## 2016-09-22 DIAGNOSIS — G4733 Obstructive sleep apnea (adult) (pediatric): Secondary | ICD-10-CM | POA: Diagnosis not present

## 2016-09-26 ENCOUNTER — Ambulatory Visit (INDEPENDENT_AMBULATORY_CARE_PROVIDER_SITE_OTHER): Payer: Self-pay | Admitting: Podiatry

## 2016-09-26 DIAGNOSIS — M722 Plantar fascial fibromatosis: Secondary | ICD-10-CM

## 2016-09-26 NOTE — Progress Notes (Signed)
Pt presents with ongoing pain in his left heel, he has tried various treatments with no success. He states thathe has noticed a slight improvement in pain, he is still able to do martial arts and golf  Pain on palpation of Lt medial heel band  ESWT therapy administered to Lt heel at 17 joules and tolerated well. EPAT delivered to surrounding connective tissues. Advised on usage of boot and avoidance of NSAIDs and ice. Re-appointed in 4 weeks for 4th treatment

## 2016-10-03 ENCOUNTER — Other Ambulatory Visit: Payer: Self-pay | Admitting: *Deleted

## 2016-10-03 MED ORDER — ATORVASTATIN CALCIUM 80 MG PO TABS: ORAL_TABLET | ORAL | 1 refills | Status: DC

## 2016-10-06 ENCOUNTER — Other Ambulatory Visit: Payer: Self-pay | Admitting: *Deleted

## 2016-10-06 DIAGNOSIS — R0989 Other specified symptoms and signs involving the circulatory and respiratory systems: Secondary | ICD-10-CM

## 2016-10-06 MED ORDER — AMLODIPINE BESYLATE 10 MG PO TABS: ORAL_TABLET | ORAL | 1 refills | Status: DC

## 2016-10-22 DIAGNOSIS — G4733 Obstructive sleep apnea (adult) (pediatric): Secondary | ICD-10-CM | POA: Diagnosis not present

## 2016-10-24 ENCOUNTER — Ambulatory Visit (INDEPENDENT_AMBULATORY_CARE_PROVIDER_SITE_OTHER): Payer: BLUE CROSS/BLUE SHIELD | Admitting: Podiatry

## 2016-10-24 DIAGNOSIS — M722 Plantar fascial fibromatosis: Secondary | ICD-10-CM

## 2016-10-27 NOTE — Progress Notes (Signed)
Pt presents with ongoing pain in his left heel, he has tried various treatments with no success. He states thathe has noticed a slight improvement in pain, he is still able to do martial arts and golf  Pain on palpation of Lt medial heel band  ESWT therapy administered to Lt heel at 15 joules and tolerated well. EPAT delivered to surrounding connective tissues. Advised on usage of boot and avoidance of NSAIDs and ice. Re-appointed in 6 weeks to see Dr Paulla Dolly

## 2016-11-05 NOTE — Progress Notes (Signed)
Assessment and Plan:   Hypertension -Continue medication, monitor blood pressure at home. Continue DASH diet.  Reminder to go to the ER if any CP, SOB, nausea, dizziness, severe HA, changes vision/speech, left arm numbness and tingling and jaw pain.  Cholesterol -Continue statin -Continue diet and exercise. Check cholesterol.    Prediabetes  -Continue diet and exercise. Check A1C  Vitamin D Def - check level and continue medications.   Morbid Obesity with co morbidities - long discussion about weight loss, diet, and exercise -Discussed increasing  -Discussed food elimination diet with tracking -Discussed low-carb and carb cycling  Hypogonadism - continue replacement therapy, check testosterone levels as needed.  -Check estrodiol for hair loss   Continue diet and meds as discussed. Further disposition pending results of labs. Over 30 minutes of exam, counseling, chart review, and critical decision making was performed  Future Appointments Date Time Provider [place]  12/12/2016 8:15 AM Wallene Huh, DPM [facility]  02/09/2017 2:00 PM [name], MD [facility] None     HPI 56 y.o. male  presents for 3 month follow up on hypertension, cholesterol, prediabetes, and vitamin D deficiency.  He has been seeing podiatrist Dr. Paulla Dolly for plantar fascitis of the left heel.   Exercises daily with aikido, watching diet closely, but seems to have hit a plateau. States Atkins/low carb has worked in the past but seemed to plateau as well- discussed carb cycling and elimination diets with tracking.   Concerned of hair loss with testosterone supplement.   His blood pressure has been controlled at home, today their BP is BP: 120/88  He does workout. He denies chest pain, shortness of breath, dizziness.   He is on cholesterol medication (lipitor 80 mg)  and denies myalgias. His cholesterol is at goal. The cholesterol last visit was:   Lab Results    Component Value Date   CHOL 149 07/15/2016   HDL 49 07/15/2016   LDLCALC 79 07/15/2016   TRIG 105 07/15/2016   CHOLHDL 3.0 07/15/2016    He has been working on diet and exercise for prediabetes, and denies paresthesia of the feet, polydipsia, polyuria and visual disturbances. Last A1C in the office was:  Lab Results  Component Value Date   HGBA1C 5.5 07/15/2016   Patient is on Vitamin D supplement.   Lab Results  Component Value Date   VD25OH 83 07/15/2016     BMI is Body mass index is 35.23 kg/m., he is working on diet and exercise. Wt Readings from Last 3 Encounters:  11/06/16 215 lb (97.5 kg)  07/15/16 213 lb 6.4 oz (96.8 kg)  06/24/16 215 lb 6.4 oz (97.7 kg)      Current Medications:  Current Outpatient Prescriptions on File Prior to Visit  Medication Sig  . amLODipine (NORVASC) 10 MG tablet Take 1/2 to 1 tablet daily as directed for BP  . atorvastatin (LIPITOR) 80 MG tablet take 1 tablet by mouth once daily for cholesterol  . B-D 3CC LUER-LOK SYR 21GX1" 21G X 1" 3 ML MISC use as directed  . clonazePAM (KLONOPIN) 1 MG tablet take 1/2-1 tablets by mouth three times a day if needed for anxiety  . nitroGLYCERIN (NITROSTAT) 0.4 MG SL tablet take 1 tablet under the tongue EVERY 3 TO 5 MINUTES if needed for chest pain  . sildenafil (REVATIO) 20 MG tablet Take 2 to 5 tablet by mouth daiily as needed.  . testosterone cypionate (DEPOTESTOSTERONE CYPIONATE) 200 MG/ML injection inject 1 milliliters intramuscularly  every  weeks  . VITAMIN D, CHOLECALCIFEROL, PO Take 10,000 mg by mouth daily.   No current facility-administered medications on file prior to visit.     Medical History:  Past Medical History:  Diagnosis Date  . Allergy   . Anxiety   . Arthritis   . Chronic pain of left knee   . Diverticulitis   . Hyperlipidemia   . IBS (irritable bowel syndrome)   . Kidney stones   . Labile hypertension   . Other testicular hypofunction   . Prediabetes   . Sleep apnea     uses CPAP  . Vitamin D deficiency    Allergies:  Allergies  Allergen Reactions  . Penicillins Anaphylaxis  . Ace Inhibitors Other (See Comments)    unknown  . Keflex [Cephalexin] Hives  . Pregabalin     Other reaction(s): Confusion (intolerance)     Review of Systems:  Review of Systems  Constitutional: Negative.   HENT: Negative for congestion, hearing loss and sore throat.   Eyes: Negative for blurred vision.  Respiratory: Negative for cough, shortness of breath and wheezing.   Cardiovascular: Negative for chest pain and palpitations.  Gastrointestinal: Negative for abdominal pain, blood in stool, constipation, melena and nausea.  Genitourinary: Negative.   Musculoskeletal: Negative for myalgias.  Skin: Negative.   Neurological: Negative for dizziness, sensory change and headaches.  Endo/Heme/Allergies: Negative.   Psychiatric/Behavioral: Negative.     Family history- Review and unchanged Social history- Review and unchanged Physical Exam: BP 120/88   Pulse 74   Temp 97.9 F (36.6 C)   Resp 16   Ht 5' 5.5" (1.664 m)   Wt 215 lb (97.5 kg)   SpO2 94%   BMI 35.23 kg/m  Wt Readings from Last 3 Encounters:  11/06/16 215 lb (97.5 kg)  07/15/16 213 lb 6.4 oz (96.8 kg)  06/24/16 215 lb 6.4 oz (97.7 kg)   General Appearance: Well nourished, in no apparent distress. Eyes: PERRLA, EOMs, conjunctiva no swelling or erythema Sinuses: No Frontal/maxillary tenderness ENT/Mouth: Ext aud canals clear, TMs without erythema, bulging. No erythema, swelling, or exudate on post pharynx.  Tonsils not swollen or erythematous. Hearing normal.  Neck: Supple, thyroid normal.  Respiratory: Respiratory effort normal, BS equal bilaterally without rales, rhonchi, wheezing or stridor.  Cardio: RRR with no MRGs. Brisk peripheral pulses with mild non-pitting eduma bilateral extremities.  Abdomen: Soft, + BS,  Non tender, no guarding, rebound, hernias, masses. Lymphatics: Non tender without  lymphadenopathy.  Musculoskeletal: Full ROM, 5/5 strength, Normal gait Skin: Warm, dry without rashes, lesions, ecchymosis.  Neuro: Cranial nerves intact. Normal muscle tone, no cerebellar symptoms. Psych: Awake and oriented X 3, normal affect, Insight and Judgment appropriate.    Izora Ribas, NP 2:32 PM [facility]

## 2016-11-06 ENCOUNTER — Ambulatory Visit (INDEPENDENT_AMBULATORY_CARE_PROVIDER_SITE_OTHER): Payer: BLUE CROSS/BLUE SHIELD | Admitting: Adult Health

## 2016-11-06 ENCOUNTER — Encounter: Payer: Self-pay | Admitting: Adult Health

## 2016-11-06 VITALS — BP 120/88 | HR 74 | Temp 97.9°F | Resp 16 | Ht 65.5 in | Wt 215.0 lb

## 2016-11-06 DIAGNOSIS — Z23 Encounter for immunization: Secondary | ICD-10-CM

## 2016-11-06 DIAGNOSIS — Z79899 Other long term (current) drug therapy: Secondary | ICD-10-CM

## 2016-11-06 DIAGNOSIS — R0989 Other specified symptoms and signs involving the circulatory and respiratory systems: Secondary | ICD-10-CM

## 2016-11-06 DIAGNOSIS — E349 Endocrine disorder, unspecified: Secondary | ICD-10-CM | POA: Diagnosis not present

## 2016-11-06 DIAGNOSIS — R7303 Prediabetes: Secondary | ICD-10-CM

## 2016-11-06 DIAGNOSIS — E559 Vitamin D deficiency, unspecified: Secondary | ICD-10-CM | POA: Diagnosis not present

## 2016-11-06 DIAGNOSIS — E782 Mixed hyperlipidemia: Secondary | ICD-10-CM | POA: Diagnosis not present

## 2016-11-06 NOTE — Patient Instructions (Signed)
Look up vionic shoes Consider increasing water intake to 154fl oz Consider elimination diet with tracking.   9 Ways to Naturally Increase Testosterone Levels  1.   Lose Weight If you're overweight, shedding the excess pounds may increase your testosterone levels, according to research presented at the Endocrine Society's 2012 meeting. Overweight men are more likely to have low testosterone levels to begin with, so this is an important trick to increase your body's testosterone production when you need it most.  2.   High-Intensity Exercise like Peak Fitness  Short intense exercise has a proven positive effect on increasing testosterone levels and preventing its decline. That's unlike aerobics or prolonged moderate exercise, which have shown to have negative or no effect on testosterone levels. Having a whey protein meal after exercise can further enhance the satiety/testosterone-boosting impact (hunger hormones cause the opposite effect on your testosterone and libido). Here's a summary of what a typical high-intensity Peak Fitness routine might look like: " Warm up for three minutes  " Exercise as hard and fast as you can for 30 seconds. You should feel like you couldn't possibly go on another few seconds  " Recover at a slow to moderate pace for 90 seconds  " Repeat the high intensity exercise and recovery 7 more times .  3.   Consume Plenty of Zinc The mineral zinc is important for testosterone production, and supplementing your diet for as little as six weeks has been shown to cause a marked improvement in testosterone among men with low levels.1 Likewise, research has shown that restricting dietary sources of zinc leads to a significant decrease in testosterone, while zinc supplementation increases it2 -- and even protects men from exercised-induced reductions in testosterone levels.3 It's estimated that up to 47 percent of adults over the age of 60 may have lower than recommended zinc  intakes; even when dietary supplements were added in, an estimated 20-25 percent of older adults still had inadequate zinc intakes, according to a [employer] and Nutrition Examination Survey.4 Your diet is the best source of zinc; along with protein-rich foods like meats and fish, other good dietary sources of zinc include raw milk, raw cheese, beans, and yogurt or kefir made from raw milk. It can be difficult to obtain enough dietary zinc if you're a vegetarian, and also for meat-eaters as well, largely because of conventional farming methods that rely heavily on chemical fertilizers and pesticides. These chemicals deplete the soil of nutrients ... nutrients like zinc that must be absorbed by plants in order to be passed on to you. In many cases, you may further deplete the nutrients in your food by the way you prepare it. For most food, cooking it will drastically reduce its levels of nutrients like zinc . particularly over-cooking, which many people do. If you decide to use a zinc supplement, stick to a dosage of less than 40 mg a day, as this is the recommended adult upper limit. Taking too much zinc can interfere with your body's ability to absorb other minerals, especially copper, and may cause nausea as a side effect.  4.   Strength Training In addition to Peak Fitness, strength training is also known to boost testosterone levels, provided you are doing so intensely enough. When strength training to boost testosterone, you'll want to increase the weight and lower your number of reps, and then focus on exercises that work a large number of muscles, such as dead lifts or squats.  You can "turbo-charge" your weight training  by going slower. By slowing down your movement, you're actually turning it into a high-intensity exercise. Super Slow movement allows your muscle, at the microscopic level, to access the maximum number of cross-bridges between the protein filaments that produce movement in the  muscle.   5.   Optimize Your Vitamin D Levels Vitamin D, a steroid hormone, is essential for the healthy development of the nucleus of the sperm cell, and helps maintain semen quality and sperm count. Vitamin D also increases levels of testosterone, which may boost libido. In one study, overweight men who were given vitamin D supplements had a significant increase in testosterone levels after one year.5   6.   Reduce Stress When you're under a lot of stress, your body releases high levels of the stress hormone cortisol. This hormone actually blocks the effects of testosterone,6 presumably because, from a biological standpoint, testosterone-associated behaviors (mating, competing, aggression) may have lowered your chances of survival in an emergency (hence, the "fight or flight" response is dominant, courtesy of cortisol).  7.   Limit or Eliminate Sugar from Your Diet Testosterone levels decrease after you eat sugar, which is likely because the sugar leads to a high insulin level, another factor leading to low testosterone.7 Based on USDA estimates, the average American consumes 12 teaspoons of sugar a day, which equates to about TWO TONS of sugar during a lifetime.  8.   Eat Healthy Fats By healthy, this means not only mon- and polyunsaturated fats, like that found in avocadoes and nuts, but also saturated, as these are essential for building testosterone. Research shows that a diet with less than 40 percent of energy as fat (and that mainly from animal sources, i.e. saturated) lead to a decrease in testosterone levels.8 My personal diet is about 60-70 percent healthy fat, and other experts agree that the ideal diet includes somewhere between 50-70 percent fat.  It's important to understand that your body requires saturated fats from animal and vegetable sources (such as meat, dairy, certain oils, and tropical plants like coconut) for optimal functioning, and if you neglect this important food group  in favor of sugar, grains and other starchy carbs, your health and weight are almost guaranteed to suffer. Examples of healthy fats you can eat more of to give your testosterone levels a boost include: Olives and Olive oil  Coconuts and coconut oil Butter made from raw grass-fed organic milk Raw nuts, such as, almonds or pecans Organic pastured egg yolks Avocados Grass-fed meats Palm oil Unheated organic nut oils   9.   Boost Your Intake of Branch Chain Amino Acids (BCAA) from Foods Like Ong suggests that BCAAs result in higher testosterone levels, particularly when taken along with resistance training.9 While BCAAs are available in supplement form, you'll find the highest concentrations of BCAAs like leucine in dairy products - especially quality cheeses and whey protein. Even when getting leucine from your natural food supply, it's often wasted or used as a building block instead of an anabolic agent. So to create the correct anabolic environment, you need to boost leucine consumption way beyond mere maintenance levels. That said, keep in mind that using leucine as a free form amino acid can be highly counterproductive as when free form amino acids are artificially administrated, they rapidly enter your circulation while disrupting insulin function, and impairing your body's glycemic control. Food-based leucine is really the ideal form that can benefit your muscles without side effects.

## 2016-11-06 NOTE — Addendum Note (Signed)
Addended by: Elsie Amis D on: 11/06/2016 03:28 PM   Modules accepted: Orders

## 2016-11-07 ENCOUNTER — Encounter: Payer: Self-pay | Admitting: Adult Health

## 2016-11-07 LAB — CBC WITH DIFFERENTIAL/PLATELET
BASOS PCT: 1.9 %
Basophils Absolute: 127 cells/uL (ref 0–200)
EOS PCT: 2.8 %
Eosinophils Absolute: 188 cells/uL (ref 15–500)
HCT: 48.9 % (ref 38.5–50.0)
HEMOGLOBIN: 16.8 g/dL (ref 13.2–17.1)
Lymphs Abs: 1615 cells/uL (ref 850–3900)
MCH: 31.1 pg (ref 27.0–33.0)
MCHC: 34.4 g/dL (ref 32.0–36.0)
MCV: 90.6 fL (ref 80.0–100.0)
MONOS PCT: 13.5 %
MPV: 11.7 fL (ref 7.5–12.5)
NEUTROS ABS: 3866 {cells}/uL (ref 1500–7800)
Neutrophils Relative %: 57.7 %
Platelets: 218 10*3/uL (ref 140–400)
RBC: 5.4 10*6/uL (ref 4.20–5.80)
RDW: 12.2 % (ref 11.0–15.0)
Total Lymphocyte: 24.1 %
WBC mixed population: 905 cells/uL (ref 200–950)
WBC: 6.7 10*3/uL (ref 3.8–10.8)

## 2016-11-07 LAB — BASIC METABOLIC PANEL WITH GFR
BUN: 16 mg/dL (ref 7–25)
CALCIUM: 9.6 mg/dL (ref 8.6–10.3)
CHLORIDE: 100 mmol/L (ref 98–110)
CO2: 29 mmol/L (ref 20–32)
Creat: 1 mg/dL (ref 0.70–1.33)
GFR, EST NON AFRICAN AMERICAN: 83 mL/min/{1.73_m2} (ref >=60)
GFR, Est African American: 96 mL/min/{1.73_m2} (ref >=60)
Glucose, Bld: 84 mg/dL (ref 65–99)
POTASSIUM: 4.3 mmol/L (ref 3.5–5.3)
Sodium: 139 mmol/L (ref 135–146)

## 2016-11-07 LAB — HEPATIC FUNCTION PANEL
AG Ratio: 1.9 (calc) (ref 1.0–2.5)
ALT: 34 U/L (ref 9–46)
AST: 27 U/L (ref 10–35)
Albumin: 4.7 g/dL (ref 3.6–5.1)
Alkaline phosphatase (APISO): 54 U/L (ref 40–115)
BILIRUBIN DIRECT: 0.2 mg/dL (ref 0.0–0.2)
BILIRUBIN INDIRECT: 0.5 mg/dL (ref 0.2–1.2)
GLOBULIN: 2.5 g/dL (ref 1.9–3.7)
Total Bilirubin: 0.7 mg/dL (ref 0.2–1.2)
Total Protein: 7.2 g/dL (ref 6.1–8.1)

## 2016-11-07 LAB — LIPID PANEL
CHOL/HDL RATIO: 2.9 (calc) (ref <5.0)
CHOLESTEROL: 153 mg/dL (ref <200)
HDL: 52 mg/dL (ref >40)
LDL Cholesterol (Calc): 70 mg/dL (calc)
Non-HDL Cholesterol (Calc): 101 mg/dL (calc) (ref <130)
Triglycerides: 211 mg/dL — ABNORMAL HIGH (ref <150)

## 2016-11-07 LAB — ESTRADIOL: Estradiol: 37 pg/mL (ref <=39)

## 2016-11-21 DIAGNOSIS — G4733 Obstructive sleep apnea (adult) (pediatric): Secondary | ICD-10-CM | POA: Diagnosis not present

## 2016-12-08 ENCOUNTER — Other Ambulatory Visit: Payer: Self-pay | Admitting: *Deleted

## 2016-12-08 MED ORDER — "SYRINGE/NEEDLE (DISP) 21G X 1"" 3 ML MISC": 1 refills | Status: DC

## 2016-12-11 ENCOUNTER — Ambulatory Visit: Payer: BLUE CROSS/BLUE SHIELD | Admitting: Podiatry

## 2016-12-12 ENCOUNTER — Encounter: Payer: Self-pay | Admitting: Podiatry

## 2016-12-12 ENCOUNTER — Ambulatory Visit (INDEPENDENT_AMBULATORY_CARE_PROVIDER_SITE_OTHER): Payer: BLUE CROSS/BLUE SHIELD | Admitting: Podiatry

## 2016-12-12 DIAGNOSIS — M722 Plantar fascial fibromatosis: Secondary | ICD-10-CM

## 2016-12-12 MED ORDER — TRIAMCINOLONE ACETONIDE 10 MG/ML IJ SUSP
10.0000 mg | Freq: Once | INTRAMUSCULAR | Status: AC
  Administered 2016-12-12: 10 mg

## 2016-12-12 NOTE — Progress Notes (Signed)
Subjective:    Patient ID: [name], male   DOB: 56 y.o.   MRN: [id number]   HPI patient presents with a continued heel pain left that has improved with shockwave but is still present with a reduction of discomfort but still making it difficult for him to be active    ROS      Objective:  Physical Exam neurovascular status intact with patient found to have continued discomfort plantar aspect left heel that has moderated some but is still quite sore with pressure. It is still within the medial band of the plantar fascia     Assessment:  Plantar fasciitis present left that's improved but present     Plan:   Discussed he may need several more shockwave therapies but at this point I did go ahead and I injected the plantar fascial left 3 mg Kenalog 5 mg Xylocaine and instructed on physical therapy anti-inflammatories. Reappoint for [place] to recheck again and I've advised that if symptoms persist he is to reappoint for shockwave therapy of 2 more visits

## 2016-12-24 DIAGNOSIS — G4733 Obstructive sleep apnea (adult) (pediatric): Secondary | ICD-10-CM | POA: Diagnosis not present

## 2016-12-31 ENCOUNTER — Other Ambulatory Visit: Payer: Self-pay | Admitting: *Deleted

## 2016-12-31 DIAGNOSIS — F411 Generalized anxiety disorder: Secondary | ICD-10-CM

## 2016-12-31 MED ORDER — CLONAZEPAM 1 MG PO TABS: ORAL_TABLET | ORAL | 0 refills | Status: DC

## 2017-01-19 ENCOUNTER — Encounter: Payer: Self-pay | Admitting: Gastroenterology

## 2017-01-23 DIAGNOSIS — G4733 Obstructive sleep apnea (adult) (pediatric): Secondary | ICD-10-CM | POA: Diagnosis not present

## 2017-01-24 ENCOUNTER — Other Ambulatory Visit: Payer: Self-pay | Admitting: Internal Medicine

## 2017-01-25 ENCOUNTER — Emergency Department (HOSPITAL_BASED_OUTPATIENT_CLINIC_OR_DEPARTMENT_OTHER)
Admission: EM | Admit: 2017-01-25 | Discharge: 2017-01-25 | Disposition: A | Discharge status: Home / Self Care | Payer: BLUE CROSS/BLUE SHIELD | Attending: Emergency Medicine | Admitting: Emergency Medicine

## 2017-01-25 ENCOUNTER — Encounter (HOSPITAL_BASED_OUTPATIENT_CLINIC_OR_DEPARTMENT_OTHER): Payer: Self-pay | Admitting: Emergency Medicine

## 2017-01-25 ENCOUNTER — Other Ambulatory Visit: Payer: Self-pay

## 2017-01-25 ENCOUNTER — Emergency Department (HOSPITAL_BASED_OUTPATIENT_CLINIC_OR_DEPARTMENT_OTHER): Payer: BLUE CROSS/BLUE SHIELD

## 2017-01-25 DIAGNOSIS — Z87891 Personal history of nicotine dependence: Secondary | ICD-10-CM | POA: Insufficient documentation

## 2017-01-25 DIAGNOSIS — R1110 Vomiting, unspecified: Secondary | ICD-10-CM | POA: Diagnosis not present

## 2017-01-25 DIAGNOSIS — R1012 Left upper quadrant pain: Secondary | ICD-10-CM | POA: Diagnosis not present

## 2017-01-25 DIAGNOSIS — R197 Diarrhea, unspecified: Secondary | ICD-10-CM | POA: Diagnosis not present

## 2017-01-25 DIAGNOSIS — Z79899 Other long term (current) drug therapy: Secondary | ICD-10-CM | POA: Insufficient documentation

## 2017-01-25 DIAGNOSIS — K5732 Diverticulitis of large intestine without perforation or abscess without bleeding: Secondary | ICD-10-CM | POA: Diagnosis not present

## 2017-01-25 DIAGNOSIS — R109 Unspecified abdominal pain: Secondary | ICD-10-CM | POA: Diagnosis not present

## 2017-01-25 DIAGNOSIS — Z96651 Presence of right artificial knee joint: Secondary | ICD-10-CM | POA: Insufficient documentation

## 2017-01-25 DIAGNOSIS — K5792 Diverticulitis of intestine, part unspecified, without perforation or abscess without bleeding: Secondary | ICD-10-CM | POA: Diagnosis not present

## 2017-01-25 LAB — LIPASE, BLOOD: Lipase: 23 U/L (ref 11–51)

## 2017-01-25 LAB — COMPREHENSIVE METABOLIC PANEL
ALT: 29 U/L (ref 17–63)
AST: 27 U/L (ref 15–41)
Albumin: 4.1 g/dL (ref 3.5–5.0)
Alkaline Phosphatase: 56 U/L (ref 38–126)
Anion gap: 7 (ref 5–15)
BUN: 14 mg/dL (ref 6–20)
CO2: 28 mmol/L (ref 22–32)
Calcium: 9.3 mg/dL (ref 8.9–10.3)
Chloride: 101 mmol/L (ref 101–111)
Creatinine, Ser: 1.07 mg/dL (ref 0.61–1.24)
GFR calc Af Amer: >60 mL/min (ref >60)
GFR calc non Af Amer: >60 mL/min (ref >60)
Glucose, Bld: 115 mg/dL — ABNORMAL HIGH (ref 65–99)
Potassium: 4.1 mmol/L (ref 3.5–5.1)
Sodium: 136 mmol/L (ref 135–145)
Total Bilirubin: 1.4 mg/dL — ABNORMAL HIGH (ref 0.3–1.2)
Total Protein: 7.1 g/dL (ref 6.5–8.1)

## 2017-01-25 LAB — URINALYSIS, ROUTINE W REFLEX MICROSCOPIC
Bilirubin Urine: NEGATIVE
Glucose, UA: NEGATIVE mg/dL
Hgb urine dipstick: NEGATIVE
Ketones, ur: NEGATIVE mg/dL
Leukocytes, UA: NEGATIVE
Nitrite: NEGATIVE
Protein, ur: 30 mg/dL — AB
Specific Gravity, Urine: 1.01 (ref 1.005–1.030)
pH: 7 (ref 5.0–8.0)

## 2017-01-25 LAB — CBC WITH DIFFERENTIAL/PLATELET
Basophils Absolute: 0 10*3/uL (ref 0.0–0.1)
Basophils Relative: 0 %
Eosinophils Absolute: 0 10*3/uL (ref 0.0–0.7)
Eosinophils Relative: 0 %
HCT: 47 % (ref 39.0–52.0)
Hemoglobin: 16 g/dL (ref 13.0–17.0)
Lymphocytes Relative: 7 %
Lymphs Abs: 1.2 10*3/uL (ref 0.7–4.0)
MCH: 31.8 pg (ref 26.0–34.0)
MCHC: 34 g/dL (ref 30.0–36.0)
MCV: 93.4 fL (ref 78.0–100.0)
Monocytes Absolute: 2.1 10*3/uL — ABNORMAL HIGH (ref 0.1–1.0)
Monocytes Relative: 12 %
Neutro Abs: 13.7 10*3/uL — ABNORMAL HIGH (ref 1.7–7.7)
Neutrophils Relative %: 81 %
Platelets: 167 10*3/uL (ref 150–400)
RBC: 5.03 MIL/uL (ref 4.22–5.81)
RDW: 12.4 % (ref 11.5–15.5)
WBC: 17.1 10*3/uL — ABNORMAL HIGH (ref 4.0–10.5)

## 2017-01-25 LAB — URINALYSIS, MICROSCOPIC (REFLEX)
RBC / HPF: NONE SEEN RBC/hpf (ref 0–5)
WBC, UA: NONE SEEN WBC/hpf (ref 0–5)

## 2017-01-25 MED ORDER — ONDANSETRON HCL 4 MG/2ML IJ SOLN
4.0000 mg | Freq: Once | INTRAMUSCULAR | Status: AC
Start: 2017-01-25 — End: 2017-01-25
  Administered 2017-01-25: 4 mg via INTRAVENOUS
  Filled 2017-01-25: qty 2

## 2017-01-25 MED ORDER — MORPHINE SULFATE (PF) 4 MG/ML IV SOLN
4.0000 mg | Freq: Once | INTRAVENOUS | Status: AC
  Administered 2017-01-25: 4 mg via INTRAVENOUS
  Filled 2017-01-25: qty 1

## 2017-01-25 MED ORDER — IOPAMIDOL (ISOVUE-300) INJECTION 61%
100.0000 mL | Freq: Once | INTRAVENOUS | Status: AC | PRN
  Administered 2017-01-25: 100 mL via INTRAVENOUS

## 2017-01-25 MED ORDER — ONDANSETRON HCL 4 MG PO TABS: 4.0000 mg | ORAL_TABLET | Freq: Four times a day (QID) | ORAL | 0 refills | Status: DC

## 2017-01-25 MED ORDER — METRONIDAZOLE 500 MG PO TABS: 500.0000 mg | ORAL_TABLET | Freq: Two times a day (BID) | ORAL | 0 refills | Status: DC

## 2017-01-25 MED ORDER — OXYCODONE-ACETAMINOPHEN 5-325 MG PO TABS: 1.0000 | ORAL_TABLET | Freq: Four times a day (QID) | ORAL | 0 refills | Status: DC | PRN

## 2017-01-25 MED ORDER — CIPROFLOXACIN HCL 500 MG PO TABS: 500.0000 mg | ORAL_TABLET | Freq: Two times a day (BID) | ORAL | 0 refills | Status: DC

## 2017-01-25 NOTE — Discharge Instructions (Signed)
Please read attached information. If you experience any new or worsening signs or symptoms please return to the emergency room for evaluation. Please follow-up with your primary care provider or specialist as discussed. Please use medication prescribed only as directed and discontinue taking if you have any concerning signs or symptoms.   °

## 2017-01-25 NOTE — ED Triage Notes (Addendum)
"   For the past 2 days  I have had  abd pain and I feel like it might be diverticulitis but the pain is worse" Pain to left side, aching all over, slight nausea
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
## 2017-02-09 ENCOUNTER — Encounter: Payer: Self-pay | Admitting: Internal Medicine

## 2017-02-27 DIAGNOSIS — G4733 Obstructive sleep apnea (adult) (pediatric): Secondary | ICD-10-CM | POA: Diagnosis not present

## 2017-03-12 ENCOUNTER — Encounter: Payer: Self-pay | Admitting: Podiatry

## 2017-03-12 ENCOUNTER — Ambulatory Visit: Payer: BLUE CROSS/BLUE SHIELD | Admitting: Podiatry

## 2017-03-12 DIAGNOSIS — M722 Plantar fascial fibromatosis: Secondary | ICD-10-CM

## 2017-03-12 NOTE — Progress Notes (Signed)
Subjective:   Patient ID: [name], male   DOB: 58 y.o.   MRN: [id number]   HPI Patient presents stating still having a lot of discomfort in the left heel is not able to wear his orthotics in a lot of issues due to his job description   ROS      Objective:  Physical Exam  Neurovascular status intact with continued discomfort medial band left plantar fascia with no indications of positive Tinel sign or indications of nerve compression.  Patient has exquisite tenderness also has moderate flatfoot     Assessment:  Fasciitis continuing left with failure to respond to numerous conservative treatments including injection immobilization and shockwave therapy     Plan:  Reviewed condition at great length and at this point surgical intervention is recommended.  Patient wants surgery and I allowed him to read consent form going over alternative treatments complications and the fact there is no long-term guarantee as far success.  Patient wants surgery signed consent form and understands total recovery can take approximately 6 months and is scheduled for outpatient surgery with encouragement to call with any further questions

## 2017-03-12 NOTE — Patient Instructions (Signed)
Pre-Operative Instructions  Congratulations, you have decided to take an important step towards improving your quality of life.  You can be assured that the doctors and staff at [facility] will be with you every step of the way.  Here are some important things you should know:  1. Plan to be at the surgery center/hospital at least 1 (one) hour prior to your scheduled time, unless otherwise directed by the surgical center/hospital staff.  You must have a responsible adult accompany you, remain during the surgery and drive you home.  Make sure you have directions to the surgical center/hospital to ensure you arrive on time. 2. If you are having surgery at [facility] or [facility] hospitals, you will need a copy of your medical history and physical form from your family physician within one month prior to the date of surgery. We will give you a form for your primary physician to complete.  3. We make every effort to accommodate the date you request for surgery.  However, there are times where surgery dates or times have to be moved.  We will contact you as soon as possible if a change in schedule is required.   4. No aspirin/ibuprofen for one week before surgery.  If you are on aspirin, any non-steroidal anti-inflammatory medications (Mobic, Aleve, Ibuprofen) should not be taken seven (7) days prior to your surgery.  You make take Tylenol for pain prior to surgery.  5. Medications - If you are taking daily heart and blood pressure medications, seizure, reflux, allergy, asthma, anxiety, pain or diabetes medications, make sure you notify the surgery center/hospital before the day of surgery so they can tell you which medications you should take or avoid the day of surgery. 6. No food or drink after midnight the night before surgery unless directed otherwise by surgical center/hospital staff. 7. No alcoholic beverages 24-hours prior to surgery.  No smoking 24-hours prior or 24-hours after  surgery. 8. Wear loose pants or shorts. They should be loose enough to fit over bandages, boots, and casts. 9. Don't wear slip-on shoes. Sneakers are preferred. 10. Bring your boot with you to the surgery center/hospital.  Also bring crutches or a walker if your physician has prescribed it for you.  If you do not have this equipment, it will be provided for you after surgery. 11. If you have not been contacted by the surgery center/hospital by the day before your surgery, call to confirm the date and time of your surgery. 12. Leave-time from work may vary depending on the type of surgery you have.  Appropriate arrangements should be made prior to surgery with your employer. 13. Prescriptions will be provided immediately following surgery by your doctor.  Fill these as soon as possible after surgery and take the medication as directed. Pain medications will not be refilled on weekends and must be approved by the doctor. 14. Remove nail polish on the operative foot and avoid getting pedicures prior to surgery. 15. Wash the night before surgery.  The night before surgery wash the foot and leg well with water and the antibacterial soap provided. Be sure to pay special attention to beneath the toenails and in between the toes.  Wash for at least three (3) minutes. Rinse thoroughly with water and dry well with a towel.  Perform this wash unless told not to do so by your physician.  Enclosed: 1 Ice pack (please put in freezer the night before surgery)   1 Hibiclens skin cleaner     Pre-op instructions  If you have any questions regarding the instructions, please do not hesitate to call our office.  [place]: [address], [place], [place] [address] -- [phone]  [facility]: [address] [place], [place] [address] -- [phone]  [facility]: [address]  [place], [place] [address] -- [phone]  [place]: [address], [address], [place],  [address] -- [phone]  Website:  [email or web address] 

## 2017-03-16 ENCOUNTER — Telehealth: Payer: Self-pay | Admitting: *Deleted

## 2017-03-16 NOTE — Telephone Encounter (Signed)
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  I checked my docket and I can actually do it sooner.  My foot is killing me.  I have time available the week of February 18 if he has anything available."  He can do it on February 19.  "Alright that sounds good.  I guess you will call later with the time?"  Someone from the surgical center will call you the Friday or Monday prior to your surgery date with the time.    I called and left Caren Griffins a message to reschedule his surgery from 05/05/2017 to 04/21/2017.

## 2017-03-17 ENCOUNTER — Ambulatory Visit (INDEPENDENT_AMBULATORY_CARE_PROVIDER_SITE_OTHER): Payer: BLUE CROSS/BLUE SHIELD | Admitting: Internal Medicine

## 2017-03-17 ENCOUNTER — Encounter: Payer: Self-pay | Admitting: Internal Medicine

## 2017-03-17 VITALS — BP 142/100 | HR 78 | Resp 16 | Ht 65.5 in | Wt 212.4 lb

## 2017-03-17 DIAGNOSIS — G4733 Obstructive sleep apnea (adult) (pediatric): Secondary | ICD-10-CM

## 2017-03-17 DIAGNOSIS — E349 Endocrine disorder, unspecified: Secondary | ICD-10-CM

## 2017-03-17 DIAGNOSIS — Z79899 Other long term (current) drug therapy: Secondary | ICD-10-CM

## 2017-03-17 DIAGNOSIS — Z0000 Encounter for general adult medical examination without abnormal findings: Secondary | ICD-10-CM | POA: Diagnosis not present

## 2017-03-17 DIAGNOSIS — Z136 Encounter for screening for cardiovascular disorders: Secondary | ICD-10-CM

## 2017-03-17 DIAGNOSIS — Z87891 Personal history of nicotine dependence: Secondary | ICD-10-CM

## 2017-03-17 DIAGNOSIS — Z0001 Encounter for general adult medical examination with abnormal findings: Secondary | ICD-10-CM

## 2017-03-17 DIAGNOSIS — E782 Mixed hyperlipidemia: Secondary | ICD-10-CM

## 2017-03-17 DIAGNOSIS — R5383 Other fatigue: Secondary | ICD-10-CM

## 2017-03-17 DIAGNOSIS — E559 Vitamin D deficiency, unspecified: Secondary | ICD-10-CM | POA: Diagnosis not present

## 2017-03-17 DIAGNOSIS — E291 Testicular hypofunction: Secondary | ICD-10-CM

## 2017-03-17 DIAGNOSIS — R7303 Prediabetes: Secondary | ICD-10-CM

## 2017-03-17 DIAGNOSIS — Z9989 Dependence on other enabling machines and devices: Secondary | ICD-10-CM

## 2017-03-17 DIAGNOSIS — Z125 Encounter for screening for malignant neoplasm of prostate: Secondary | ICD-10-CM | POA: Diagnosis not present

## 2017-03-17 DIAGNOSIS — I10 Essential (primary) hypertension: Secondary | ICD-10-CM | POA: Diagnosis not present

## 2017-03-17 DIAGNOSIS — I700 Atherosclerosis of aorta: Secondary | ICD-10-CM

## 2017-03-17 DIAGNOSIS — I2510 Atherosclerotic heart disease of native coronary artery without angina pectoris: Secondary | ICD-10-CM

## 2017-03-17 DIAGNOSIS — Z1211 Encounter for screening for malignant neoplasm of colon: Secondary | ICD-10-CM

## 2017-03-17 DIAGNOSIS — I2584 Coronary atherosclerosis due to calcified coronary lesion: Secondary | ICD-10-CM

## 2017-03-17 DIAGNOSIS — R0989 Other specified symptoms and signs involving the circulatory and respiratory systems: Secondary | ICD-10-CM

## 2017-03-17 DIAGNOSIS — Z111 Encounter for screening for respiratory tuberculosis: Secondary | ICD-10-CM

## 2017-03-17 DIAGNOSIS — Z1212 Encounter for screening for malignant neoplasm of rectum: Secondary | ICD-10-CM
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
## 2017-03-17 NOTE — Patient Instructions (Signed)
Preventive Care for Adults  A healthy lifestyle and preventive care can promote health and wellness. Preventive health guidelines for men include the following key practices:  A routine yearly physical is a good way to check with your health care provider about your health and preventative screening. It is a chance to share any concerns and updates on your health and to receive a thorough exam.  Visit your dentist for a routine exam and preventative care every 6 months. Brush your teeth twice a day and floss once a day. Good oral hygiene prevents tooth decay and gum disease.  The frequency of eye exams is based on your age, health, family medical history, use of contact lenses, and other factors. Follow your health care provider's recommendations for frequency of eye exams.  Eat a healthy diet. Foods such as vegetables, fruits, whole grains, low-fat dairy products, and lean protein foods contain the nutrients you need without too many calories. Decrease your intake of foods high in solid fats, added sugars, and salt. Eat the right amount of calories for you.Get information about a proper diet from your health care provider, if necessary.  Regular physical exercise is one of the most important things you can do for your health. Most adults should get at least 150 minutes of moderate-intensity exercise (any activity that increases your heart rate and causes you to sweat) each week. In addition, most adults need muscle-strengthening exercises on 2 or more days a week.  Maintain a healthy weight. The body mass index (BMI) is a screening tool to identify possible weight problems. It provides an estimate of body fat based on height and weight. Your health care provider can find your BMI and can help you achieve or maintain a healthy weight.For adults 20 years and older:  A BMI below 18.5 is considered underweight.  A BMI of 18.5 to 24.9 is normal.  A BMI of 25 to 29.9 is considered overweight.  A  BMI of 30 and above is considered obese.  Maintain normal blood lipids and cholesterol levels by exercising and minimizing your intake of saturated fat. Eat a balanced diet with plenty of fruit and vegetables. Blood tests for lipids and cholesterol should begin at age 20 and be repeated every 5 years. If your lipid or cholesterol levels are high, you are over 50, or you are at high risk for heart disease, you may need your cholesterol levels checked more frequently.Ongoing high lipid and cholesterol levels should be treated with medicines if diet and exercise are not working.  If you smoke, find out from your health care provider how to quit. If you do not use tobacco, do not start.  Lung cancer screening is recommended for adults aged 55-80 years who are at high risk for developing lung cancer because of a history of smoking. A yearly low-dose CT scan of the lungs is recommended for people who have at least a 30-pack-year history of smoking and are a current smoker or have quit within the past 15 years. A pack year of smoking is smoking an average of 1 pack of cigarettes a day for 1 year (for example: 1 pack a day for 30 years or 2 packs a day for 15 years). Yearly screening should continue until the smoker has stopped smoking for at least 15 years. Yearly screening should be stopped for people who develop a health problem that would prevent them from having lung cancer treatment.  If you choose to drink alcohol, do not have more   than 2 drinks per day. One drink is considered to be 12 ounces (355 mL) of beer, 5 ounces (148 mL) of wine, or 1.5 ounces (44 mL) of liquor.  Avoid use of street drugs. Do not share needles with anyone. Ask for help if you need support or instructions about stopping the use of drugs.  High blood pressure causes heart disease and increases the risk of stroke. Your blood pressure should be checked at least every 1-2 years. Ongoing high blood pressure should be treated with  medicines, if weight loss and exercise are not effective.  If you are 45-79 years old, ask your health care provider if you should take aspirin to prevent heart disease.  Diabetes screening involves taking a blood sample to check your fasting blood sugar level. This should be done once every 3 years, after age 45, if you are within normal weight and without risk factors for diabetes. Testing should be considered at a younger age or be carried out more frequently if you are overweight and have at least 1 risk factor for diabetes.  Colorectal cancer can be detected and often prevented. Most routine colorectal cancer screening begins at the age of 50 and continues through age 75. However, your health care provider may recommend screening at an earlier age if you have risk factors for colon cancer. On a yearly basis, your health care provider may provide home test kits to check for hidden blood in the stool. Use of a small camera at the end of a tube to directly examine the colon (sigmoidoscopy or colonoscopy) can detect the earliest forms of colorectal cancer. Talk to your health care provider about this at age 50, when routine screening begins. Direct exam of the colon should be repeated every 5-10 years through age 75, unless early forms of precancerous polyps or small growths are found.   Talk with your health care provider about prostate cancer screening.  Testicular cancer screening isrecommended for adult males. Screening includes self-exam, a health care provider exam, and other screening tests. Consult with your health care provider about any symptoms you have or any concerns you have about testicular cancer.  Use sunscreen. Apply sunscreen liberally and repeatedly throughout the day. You should seek shade when your shadow is shorter than you. Protect yourself by wearing long sleeves, pants, a wide-brimmed hat, and sunglasses year round, whenever you are outdoors.  Once a month, do a whole-body  skin exam, using a mirror to look at the skin on your back. Tell your health care provider about new moles, moles that have irregular borders, moles that are larger than a pencil eraser, or moles that have changed in shape or color.  Stay current with required vaccines (immunizations).  Influenza vaccine. All adults should be immunized every year.  Tetanus, diphtheria, and acellular pertussis (Td, Tdap) vaccine. An adult who has not previously received Tdap or who does not know his vaccine status should receive 1 dose of Tdap. This initial dose should be followed by tetanus and diphtheria toxoids (Td) booster doses every 10 years. Adults with an unknown or incomplete history of completing a 3-dose immunization series with Td-containing vaccines should begin or complete a primary immunization series including a Tdap dose. Adults should receive a Td booster every 10 years.  Varicella vaccine. An adult without evidence of immunity to varicella should receive 2 doses or a second dose if he has previously received 1 dose.  Human papillomavirus (HPV) vaccine. Males aged 13-21 years who have not   received the vaccine previously should receive the 3-dose series. Males aged 22-26 years may be immunized. Immunization is recommended through the age of 26 years for any male who has sex with males and did not get any or all doses earlier. Immunization is recommended for any person with an immunocompromised condition through the age of 26 years if he did not get any or all doses earlier. During the 3-dose series, the second dose should be obtained 4-8 weeks after the first dose. The third dose should be obtained 24 weeks after the first dose and 16 weeks after the second dose.  Zoster vaccine. One dose is recommended for adults aged 60 years or older unless certain conditions are present.    PREVNAR  - Pneumococcal 13-valent conjugate (PCV13) vaccine. When indicated, a person who is uncertain of his immunization  history and has no record of immunization should receive the PCV13 vaccine. An adult aged 19 years or older who has certain medical conditions and has not been previously immunized should receive 1 dose of PCV13 vaccine. This PCV13 should be followed with a dose of pneumococcal polysaccharide (PPSV23) vaccine. The PPSV23 vaccine dose should be obtained at least 8 weeks after the dose of PCV13 vaccine. An adult aged 19 years or older who has certain medical conditions and previously received 1 or more doses of PPSV23 vaccine should receive 1 dose of PCV13. The PCV13 vaccine dose should be obtained 1 or more years after the last PPSV23 vaccine dose.    PNEUMOVAX - Pneumococcal polysaccharide (PPSV23) vaccine. When PCV13 is also indicated, PCV13 should be obtained first. All adults aged 65 years and older should be immunized. An adult younger than age 65 years who has certain medical conditions should be immunized. Any person who resides in a nursing home or long-term care facility should be immunized. An adult smoker should be immunized. People with an immunocompromised condition and certain other conditions should receive both PCV13 and PPSV23 vaccines. People with human immunodeficiency virus (HIV) infection should be immunized as soon as possible after diagnosis. Immunization during chemotherapy or radiation therapy should be avoided. Routine use of PPSV23 vaccine is not recommended for American Indians, [place] Natives, or people younger than 65 years unless there are medical conditions that require PPSV23 vaccine. When indicated, people who have unknown immunization and have no record of immunization should receive PPSV23 vaccine. One-time revaccination 5 years after the first dose of PPSV23 is recommended for people aged 19-64 years who have chronic kidney failure, nephrotic syndrome, asplenia, or immunocompromised conditions. People who received 1-2 doses of PPSV23 before age 65 years should receive another  dose of PPSV23 vaccine at age 65 years or later if at least 5 years have passed since the previous dose. Doses of PPSV23 are not needed for people immunized with PPSV23 at or after age 65 years.    Hepatitis A vaccine. Adults who wish to be protected from this disease, have certain high-risk conditions, work with hepatitis A-infected animals, work in hepatitis A research labs, or travel to or work in countries with a high rate of hepatitis A should be immunized. Adults who were previously unvaccinated and who anticipate close contact with an international adoptee during the first 60 days after arrival in the [place] from a country with a high rate of hepatitis A should be immunized.    Hepatitis B vaccine. Adults should be immunized if they wish to be protected from this disease, have certain high-risk conditions, may be exposed to   blood or other infectious body fluids, are household contacts or sex partners of hepatitis B positive people, are clients or workers in certain care facilities, or travel to or work in countries with a high rate of hepatitis B.   Preventive Service / Frequency   Ages 40 to 64  Blood pressure check.  Lipid and cholesterol check  Lung cancer screening. / Every year if you are aged 55-80 years and have a 30-pack-year history of smoking and currently smoke or have quit within the past 15 years. Yearly screening is stopped once you have quit smoking for at least 15 years or develop a health problem that would prevent you from having lung cancer treatment.  Fecal occult blood test (FOBT) of stool. / Every year beginning at age 50 and continuing until age 75. You may not have to do this test if you get a colonoscopy every 10 years.  Flexible sigmoidoscopy** or colonoscopy.** / Every 5 years for a flexible sigmoidoscopy or every 10 years for a colonoscopy beginning at age 50 and continuing until age 75. Screening for abdominal aortic aneurysm (AAA)  by ultrasound is  recommended for people who have history of high blood pressure or who are current or former smokers. +++++++++++ Recommend Adult Low Dose Aspirin or  coated  Aspirin 81 mg daily  To reduce risk of Colon Cancer 20 %,  Skin Cancer 26 % ,  Melanoma 46%  and  Pancreatic cancer 60% ++++++++++++++++++++ Vitamin D goal  is between 70-100.  Please make sure that you are taking your Vitamin D as directed.  It is very important as a natural anti-inflammatory  helping hair, skin, and nails, as well as reducing stroke and heart attack risk.  It helps your bones and helps with mood. It also decreases numerous cancer risks so please take it as directed.  Low Vit D is associated with a 200-300% higher risk for CANCER  and 200-300% higher risk for HEART   ATTACK  &  STROKE.   ...................................... It is also associated with higher death rate at younger ages,  autoimmune diseases like Rheumatoid arthritis, Lupus, Multiple Sclerosis.    Also many other serious conditions, like depression, Alzheimer's Dementia, infertility, muscle aches, fatigue, fibromyalgia - just to name a few. +++++++++++++++++++++ Recommend the book "The END of DIETING" by Dr Remberto Fuhrman  & the book "The END of DIABETES " by Dr Rondall Fuhrman At [employer] - get book & Audio CD's    Being diabetic has a  300% increased risk for heart attack, stroke, cancer, and alzheimer- type vascular dementia. It is very important that you work harder with diet by avoiding all foods that are white. Avoid white rice (brown & wild rice is OK), white potatoes (sweetpotatoes in moderation is OK), White bread or wheat bread or anything made out of white flour like bagels, donuts, rolls, buns, biscuits, cakes, pastries, cookies, pizza crust, and pasta (made from white flour & egg whites) - vegetarian pasta or spinach or wheat pasta is OK. Multigrain breads like Arnold's or [employer], or multigrain sandwich thins or flatbreads.  Diet,  exercise and weight loss can reverse and cure diabetes in the early stages.  Diet, exercise and weight loss is very important in the control and prevention of complications of diabetes which affects every system in your body, ie. Brain - dementia/stroke, eyes - glaucoma/blindness, heart - heart attack/heart failure, kidneys - dialysis, stomach - gastric paralysis, intestines - malabsorption, nerves - severe painful neuritis, circulation -   gangrene & loss of a leg(s), and finally cancer and Alzheimers.    I recommend avoid fried & greasy foods,  sweets/candy, white rice (brown or wild rice or Quinoa is OK), white potatoes (sweet potatoes are OK) - anything made from white flour - bagels, doughnuts, rolls, buns, biscuits,white and wheat breads, pizza crust and traditional pasta made of white flour & egg white(vegetarian pasta or spinach or wheat pasta is OK).  Multi-grain bread is OK - like multi-grain flat bread or sandwich thins. Avoid alcohol in excess. Exercise is also important.    Eat all the vegetables you want - avoid meat, especially red meat and dairy - especially cheese.  Cheese is the most concentrated form of trans-fats which is the worst thing to clog up our arteries. Veggie cheese is OK which can be found in the fresh produce section at [employer] or [employer] or [employer]  ++++++++++++++++++++++ DASH Eating Plan  DASH stands for "Dietary Approaches to Stop Hypertension."   The DASH eating plan is a healthy eating plan that has been shown to reduce high blood pressure (hypertension). Additional health benefits may include reducing the risk of type 2 diabetes mellitus, heart disease, and stroke. The DASH eating plan may also help with weight loss. WHAT DO I NEED TO KNOW ABOUT THE DASH EATING PLAN? For the DASH eating plan, you will follow these general guidelines:  Choose foods with a percent daily value for sodium of less than 5% (as listed on the food label).  Use salt-free  seasonings or herbs instead of table salt or sea salt.  Check with your health care provider or pharmacist before using salt substitutes.  Eat lower-sodium products, often labeled as "lower sodium" or "no salt added."  Eat fresh foods.  Eat more vegetables, fruits, and low-fat dairy products.  Choose whole grains. Look for the word "whole" as the first word in the ingredient list.  Choose fish   Limit sweets, desserts, sugars, and sugary drinks.  Choose heart-healthy fats.  Eat veggie cheese   Eat more home-cooked food and less restaurant, buffet, and fast food.  Limit fried foods.  Cook foods using methods other than frying.  Limit canned vegetables. If you do use them, rinse them well to decrease the sodium.  When eating at a restaurant, ask that your food be prepared with less salt, or no salt if possible.                      WHAT FOODS CAN I EAT? Read Dr Zakkary Fuhrman's books on The End of Dieting & The End of Diabetes  Grains Whole grain or whole wheat bread. Brown rice. Whole grain or whole wheat pasta. Quinoa, bulgur, and whole grain cereals. Low-sodium cereals. Corn or whole wheat flour tortillas. Whole grain cornbread. Whole grain crackers. Low-sodium crackers.  Vegetables Fresh or frozen vegetables (raw, steamed, roasted, or grilled). Low-sodium or reduced-sodium tomato and vegetable juices. Low-sodium or reduced-sodium tomato sauce and paste. Low-sodium or reduced-sodium canned vegetables.   Fruits All fresh, canned (in natural juice), or frozen fruits.  Protein Products  All fish and seafood.  Dried beans, peas, or lentils. Unsalted nuts and seeds. Unsalted canned beans.  Dairy Low-fat dairy products, such as skim or 1% milk, 2% or reduced-fat cheeses, low-fat ricotta or cottage cheese, or plain low-fat yogurt. Low-sodium or reduced-sodium cheeses.  Fats and Oils Tub margarines without trans fats. Light or reduced-fat mayonnaise and salad dressings  (reduced sodium). Avocado. Safflower,   olive, or canola oils. Natural peanut or almond butter.  Other Unsalted popcorn and pretzels. The items listed above may not be a complete list of recommended foods or beverages. Contact your dietitian for more options.  +++++++++++++++++++  WHAT FOODS ARE NOT RECOMMENDED? Grains/ White flour or wheat flour White bread. White pasta. White rice. Refined cornbread. Bagels and croissants. Crackers that contain trans fat.  Vegetables  Creamed or fried vegetables. Vegetables in a . Regular canned vegetables. Regular canned tomato sauce and paste. Regular tomato and vegetable juices.  Fruits Dried fruits. Canned fruit in light or heavy syrup. Fruit juice.  Meat and Other Protein Products Meat in general - RED meat & White meat.  Fatty cuts of meat. Ribs, chicken wings, all processed meats as bacon, sausage, bologna, salami, fatback, hot dogs, bratwurst and packaged luncheon meats.  Dairy Whole or 2% milk, cream, half-and-half, and cream cheese. Whole-fat or sweetened yogurt. Full-fat cheeses or blue cheese. Non-dairy creamers and whipped toppings. Processed cheese, cheese spreads, or cheese curds.  Condiments Onion and garlic salt, seasoned salt, table salt, and sea salt. Canned and packaged gravies. Worcestershire sauce. Tartar sauce. Barbecue sauce. Teriyaki sauce. Soy sauce, including reduced sodium. Steak sauce. Fish sauce. Oyster sauce. Cocktail sauce. Horseradish. Ketchup and mustard. Meat flavorings and tenderizers. Bouillon cubes. Hot sauce. Tabasco sauce. Marinades. Taco seasonings. Relishes.  Fats and Oils Butter, stick margarine, lard, shortening and bacon fat. Coconut, palm kernel, or palm oils. Regular salad dressings.  Pickles and olives. Salted popcorn and pretzels.  The items listed above may not be a complete list of foods and beverages to avoid.    

## 2017-03-18 LAB — BASIC METABOLIC PANEL WITH GFR
BUN: 14 mg/dL (ref 7–25)
CO2: 28 mmol/L (ref 20–32)
CREATININE: 0.98 mg/dL (ref 0.70–1.33)
Calcium: 9.7 mg/dL (ref 8.6–10.3)
Chloride: 102 mmol/L (ref 98–110)
GFR, Est African American: 98 mL/min/{1.73_m2} (ref >=60)
GFR, Est Non African American: 85 mL/min/{1.73_m2} (ref >=60)
GLUCOSE: 94 mg/dL (ref 65–99)
POTASSIUM: 4.2 mmol/L (ref 3.5–5.3)
SODIUM: 139 mmol/L (ref 135–146)

## 2017-03-18 LAB — URINALYSIS, ROUTINE W REFLEX MICROSCOPIC
BACTERIA UA: NONE SEEN /HPF
Bilirubin Urine: NEGATIVE
GLUCOSE, UA: NEGATIVE
HGB URINE DIPSTICK: NEGATIVE
Hyaline Cast: NONE SEEN /LPF
Ketones, ur: NEGATIVE
LEUKOCYTES UA: NEGATIVE
NITRITE: NEGATIVE
RBC / HPF: NONE SEEN /HPF (ref 0–2)
SQUAMOUS EPITHELIAL / LPF: NONE SEEN /HPF (ref <=5)
Specific Gravity, Urine: 1.017 (ref 1.001–1.03)
WBC, UA: NONE SEEN /HPF (ref 0–5)
pH: 7 (ref 5.0–8.0)

## 2017-03-18 LAB — CBC WITH DIFFERENTIAL/PLATELET
BASOS ABS: 91 {cells}/uL (ref 0–200)
Basophils Relative: 1.3 %
EOS ABS: 77 {cells}/uL (ref 15–500)
EOS PCT: 1.1 %
HCT: 49.7 % (ref 38.5–50.0)
HEMOGLOBIN: 17 g/dL (ref 13.2–17.1)
Lymphs Abs: 1379 cells/uL (ref 850–3900)
MCH: 31.1 pg (ref 27.0–33.0)
MCHC: 34.2 g/dL (ref 32.0–36.0)
MCV: 91 fL (ref 80.0–100.0)
MONOS PCT: 11 %
MPV: 11.6 fL (ref 7.5–12.5)
NEUTROS PCT: 66.9 %
Neutro Abs: 4683 cells/uL (ref 1500–7800)
Platelets: 222 10*3/uL (ref 140–400)
RBC: 5.46 10*6/uL (ref 4.20–5.80)
RDW: 12.6 % (ref 11.0–15.0)
Total Lymphocyte: 19.7 %
WBC mixed population: 770 cells/uL (ref 200–950)
WBC: 7 10*3/uL (ref 3.8–10.8)

## 2017-03-18 LAB — VITAMIN B12: Vitamin B-12: 812 pg/mL (ref 200–1100)

## 2017-03-18 LAB — LIPID PANEL
CHOL/HDL RATIO: 3.3 (calc) (ref <5.0)
CHOLESTEROL: 180 mg/dL (ref <200)
HDL: 54 mg/dL (ref >40)
LDL Cholesterol (Calc): 98 mg/dL (calc)
NON-HDL CHOLESTEROL (CALC): 126 mg/dL (ref <130)
Triglycerides: 181 mg/dL — ABNORMAL HIGH (ref <150)

## 2017-03-18 LAB — VITAMIN D 25 HYDROXY (VIT D DEFICIENCY, FRACTURES): VIT D 25 HYDROXY: 82 ng/mL (ref 30–100)

## 2017-03-18 LAB — HEPATIC FUNCTION PANEL
AG RATIO: 1.8 (calc) (ref 1.0–2.5)
ALKALINE PHOSPHATASE (APISO): 55 U/L (ref 40–115)
ALT: 36 U/L (ref 9–46)
AST: 31 U/L (ref 10–35)
Albumin: 4.6 g/dL (ref 3.6–5.1)
BILIRUBIN INDIRECT: 0.5 mg/dL (ref 0.2–1.2)
BILIRUBIN TOTAL: 0.7 mg/dL (ref 0.2–1.2)
Bilirubin, Direct: 0.2 mg/dL (ref 0.0–0.2)
Globulin: 2.6 g/dL (calc) (ref 1.9–3.7)
TOTAL PROTEIN: 7.2 g/dL (ref 6.1–8.1)

## 2017-03-18 LAB — MICROALBUMIN / CREATININE URINE RATIO
Creatinine, Urine: 135 mg/dL (ref 20–320)
MICROALB/CREAT RATIO: 123 ug/mg{creat} — AB (ref <30)
Microalb, Ur: 16.6 mg/dL

## 2017-03-18 LAB — PSA: PSA: 1.4 ng/mL (ref <=4.0)

## 2017-03-18 LAB — IRON, TOTAL/TOTAL IRON BINDING CAP
%SAT: 27 % (ref 15–60)
Iron: 91 ug/dL (ref 50–180)
TIBC: 343 ug/dL (ref 250–425)

## 2017-03-18 LAB — MAGNESIUM: Magnesium: 2 mg/dL (ref 1.5–2.5)

## 2017-03-18 LAB — INSULIN, RANDOM: INSULIN: 12.3 u[IU]/mL (ref 2.0–19.6)

## 2017-03-18 LAB — TESTOSTERONE: Testosterone: 1242 ng/dL — ABNORMAL HIGH (ref 250–827)

## 2017-03-18 LAB — HEMOGLOBIN A1C
HEMOGLOBIN A1C: 5.6 %{Hb} (ref <5.7)
MEAN PLASMA GLUCOSE: 114 (calc)
eAG (mmol/L): 6.3 (calc)

## 2017-03-18 LAB — TSH: TSH: 0.94 mIU/L (ref 0.40–4.50)

## 2017-03-18 MED ORDER — FINASTERIDE 5 MG PO TABS: ORAL_TABLET | ORAL | 3 refills | Status: DC

## 2017-03-19 ENCOUNTER — Encounter: Payer: Self-pay | Admitting: Gastroenterology

## 2017-04-01 ENCOUNTER — Other Ambulatory Visit: Payer: Self-pay | Admitting: Internal Medicine

## 2017-04-01 DIAGNOSIS — R0989 Other specified symptoms and signs involving the circulatory and respiratory systems: Secondary | ICD-10-CM

## 2017-04-02 ENCOUNTER — Ambulatory Visit: Payer: BLUE CROSS/BLUE SHIELD | Admitting: Orthotics

## 2017-04-02 DIAGNOSIS — M722 Plantar fascial fibromatosis: Secondary | ICD-10-CM

## 2017-04-02 NOTE — Progress Notes (Signed)
Picked up f/o..said they felt great and can tell they will make a difference, at least they will make do until surgery.
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
## 2017-04-15 ENCOUNTER — Encounter: Payer: Self-pay | Admitting: Cardiovascular Disease

## 2017-04-15 ENCOUNTER — Other Ambulatory Visit: Payer: Self-pay

## 2017-04-15 ENCOUNTER — Ambulatory Visit: Payer: BLUE CROSS/BLUE SHIELD | Admitting: Cardiovascular Disease

## 2017-04-15 VITALS — BP 178/88 | HR 89 | Ht 65.0 in | Wt 212.5 lb

## 2017-04-15 DIAGNOSIS — Z79899 Other long term (current) drug therapy: Secondary | ICD-10-CM

## 2017-04-15 DIAGNOSIS — Z1212 Encounter for screening for malignant neoplasm of rectum: Principal | ICD-10-CM

## 2017-04-15 DIAGNOSIS — I2510 Atherosclerotic heart disease of native coronary artery without angina pectoris: Secondary | ICD-10-CM

## 2017-04-15 DIAGNOSIS — E785 Hyperlipidemia, unspecified: Secondary | ICD-10-CM | POA: Diagnosis not present

## 2017-04-15 DIAGNOSIS — I10 Essential (primary) hypertension: Secondary | ICD-10-CM | POA: Diagnosis not present

## 2017-04-15 DIAGNOSIS — Z1211 Encounter for screening for malignant neoplasm of colon: Secondary | ICD-10-CM

## 2017-04-15 LAB — POC HEMOCCULT BLD/STL (HOME/3-CARD/SCREEN)
Card #2 Fecal Occult Blod, POC: NEGATIVE
Card #3 Fecal Occult Blood, POC: NEGATIVE
FECAL OCCULT BLD: NEGATIVE

## 2017-04-15 MED ORDER — LOSARTAN POTASSIUM-HCTZ 50-12.5 MG PO TABS: 1.0000 | ORAL_TABLET | Freq: Every day | ORAL | 3 refills | Status: DC

## 2017-04-15 NOTE — Patient Instructions (Addendum)
Medication Instructions:  Your physician has recommended you make the following change in your medication:  1-START Hyzaar 50/12.5 mg by mouth daily  Labwork: Your physician recommends that you return for lab work in: 4 weeks with your PCP for BMET  Testing/Procedures: NONE  Follow-Up: Your physician wants you to follow-up in: 12 months with Dr. Johnsie Cancel. You will receive a reminder letter in the mail two months in advance. If you don't receive a letter, please call our office to schedule the follow-up appointment.   If you need a refill on your cardiac medications before your next appointment, please call your pharmacy.   '

## 2017-04-16 DIAGNOSIS — Z1212 Encounter for screening for malignant neoplasm of rectum: Secondary | ICD-10-CM | POA: Diagnosis not present

## 2017-04-17 ENCOUNTER — Other Ambulatory Visit: Payer: Self-pay

## 2017-04-17 ENCOUNTER — Ambulatory Visit (AMBULATORY_SURGERY_CENTER): Payer: Self-pay

## 2017-04-17 VITALS — Ht 66.0 in | Wt 210.4 lb

## 2017-04-17 DIAGNOSIS — Z86010 Personal history of colonic polyps: Secondary | ICD-10-CM

## 2017-04-17 MED ORDER — NA SULFATE-K SULFATE-MG SULF 17.5-3.13-1.6 GM/177ML PO SOLN: 1.0000 | Freq: Once | ORAL | 0 refills | Status: AC

## 2017-04-17 NOTE — Progress Notes (Signed)
No egg or soy allergy known to patient  No issues with past sedation with any surgeries  or procedures, no intubation problems  No diet pills per patient No home 02 use per patient  No blood thinners per patient  Pt denies issues with constipation  No A fib or A flutter  EMMI video sent to pt's e mail pt declined   

## 2017-04-21 ENCOUNTER — Encounter: Payer: Self-pay | Admitting: Podiatry

## 2017-04-21 ENCOUNTER — Telehealth: Payer: Self-pay | Admitting: Gastroenterology

## 2017-04-21 ENCOUNTER — Encounter: Payer: Self-pay | Admitting: Gastroenterology

## 2017-04-21 DIAGNOSIS — E7800 Pure hypercholesterolemia, unspecified: Secondary | ICD-10-CM | POA: Diagnosis not present

## 2017-04-21 DIAGNOSIS — M722 Plantar fascial fibromatosis: Secondary | ICD-10-CM | POA: Diagnosis not present

## 2017-04-21 NOTE — Telephone Encounter (Signed)
Wife took pay no more than $50 coupon to pharmacy and pharmacy told wife they could not do anything with the coupon until we did a PA.  Instructed pt again, as he was instructed in PV, that we don't do PA's and why and that the pharmacy needs to run the coupon as cash and the coupon will drop the price to $50. Pt states he will take it back and explain to [employer]. Instructed pt to call with further issues or concerns.  Lelan Pons PV

## 2017-04-21 NOTE — Telephone Encounter (Signed)
Patient states insurance will not cover prep for colon on 3.1.19 and pt wants to know what he can use. Pt had pv on 2.15.19.

## 2017-04-22 ENCOUNTER — Telehealth: Payer: Self-pay | Admitting: *Deleted

## 2017-04-22 NOTE — Telephone Encounter (Signed)
Spoke with the patient today and I stated that I was calling to check on him to see how he was doing since the surgery yesterday and the patient stated that he was doing great and there was no fever, chills, nausea and I stated to call us if any concerns or questions and he stated to tell Dr Paulla Dolly Thank you again and I stated that I would. Lattie Haw

## 2017-04-29 ENCOUNTER — Ambulatory Visit (INDEPENDENT_AMBULATORY_CARE_PROVIDER_SITE_OTHER): Payer: BLUE CROSS/BLUE SHIELD | Admitting: Podiatry

## 2017-04-29 ENCOUNTER — Encounter: Payer: Self-pay | Admitting: Podiatry

## 2017-04-29 VITALS — BP 156/88 | HR 78

## 2017-04-29 DIAGNOSIS — M722 Plantar fascial fibromatosis: Secondary | ICD-10-CM

## 2017-04-30 NOTE — Progress Notes (Signed)
Subjective:   Patient ID: [name], male   DOB: 57 y.o.   MRN: [id number]   HPI Patient states doing great with minimal discomfort and able to walk with his   ROS      Objective:  Physical Exam  Neurovascular status intact with patient's left heel healing well with incisions healing well with wound edges well coapted stitches in place     Assessment:  Doing well post endoscopic surgery left heel     Plan:  Reviewed continued immobilization elevation can correction and patient will be seen back 2 weeks for suture removal or earlier if any issues should occur

## 2017-05-01 ENCOUNTER — Encounter: Payer: Self-pay | Admitting: Gastroenterology

## 2017-05-01 ENCOUNTER — Ambulatory Visit (AMBULATORY_SURGERY_CENTER): Payer: BLUE CROSS/BLUE SHIELD | Admitting: Gastroenterology

## 2017-05-01 ENCOUNTER — Other Ambulatory Visit: Payer: Self-pay

## 2017-05-01 VITALS — BP 119/53 | HR 54 | Temp 97.7°F | Resp 13

## 2017-05-01 DIAGNOSIS — D123 Benign neoplasm of transverse colon: Secondary | ICD-10-CM

## 2017-05-01 DIAGNOSIS — D125 Benign neoplasm of sigmoid colon: Secondary | ICD-10-CM | POA: Diagnosis not present

## 2017-05-01 DIAGNOSIS — Z86010 Personal history of colonic polyps: Secondary | ICD-10-CM | POA: Diagnosis not present

## 2017-05-01 DIAGNOSIS — D122 Benign neoplasm of ascending colon: Secondary | ICD-10-CM

## 2017-05-01 DIAGNOSIS — D120 Benign neoplasm of cecum: Secondary | ICD-10-CM | POA: Diagnosis not present

## 2017-05-01 MED ORDER — SODIUM CHLORIDE 0.9 % IV SOLN: 500.0000 mL | Freq: Once | INTRAVENOUS | Status: DC

## 2017-05-01 NOTE — Op Note (Signed)
[facility] Patient Name: [name] Procedure Date: 05/01/2017 10:26 AM MRN: [id number] Endoscopist: Remo Lipps P. Armbruster MD, MD Age: 57 Referring MD:  Date of Birth: 1960/09/28 Gender: Male Account #: [id number] Procedure:                Colonoscopy Indications:              Surveillance: Personal history of adenomatous                            polyps on last colonoscopy 5 years ago Medicines:                Monitored Anesthesia Care Procedure:                Pre-Anesthesia Assessment:                           - Prior to the procedure, a History and Physical                            was performed, and patient medications and                            allergies were reviewed. The patient's tolerance of                            previous anesthesia was also reviewed. The risks                            and benefits of the procedure and the sedation                            options and risks were discussed with the patient.                            All questions were answered, and informed consent                            was obtained. Prior Anticoagulants: The patient has                            taken no previous anticoagulant or antiplatelet                            agents. ASA Grade Assessment: II - A patient with                            mild systemic disease. After reviewing the risks                            and benefits, the patient was deemed in                            satisfactory condition to undergo the procedure.  After obtaining informed consent, the colonoscope                            was passed under direct vision. Throughout the                            procedure, the patient's blood pressure, pulse, and                            oxygen saturations were monitored continuously. The                            Model CF-HQ190L [id number]) scope was introduced                            through the anus and  advanced to the the cecum,                            identified by appendiceal orifice and ileocecal                            valve. The colonoscopy was performed without                            difficulty. The patient tolerated the procedure                            well. The quality of the bowel preparation was                            adequate. The ileocecal valve, appendiceal orifice,                            and rectum were photographed. Scope In: 10:40:53 AM Scope Out: 41:63:84 AM Scope Withdrawal Time: 0 hours 22 minutes 26 seconds  Total Procedure Duration: 0 hours 26 minutes 24 seconds  Findings:                 The perianal and digital rectal examinations were                            normal.                           A diminutive polyp was found in the cecum. The                            polyp was sessile. The polyp was removed with a                            cold snare. Resection and retrieval were complete.                           A 15 mm polyp was found in the ascending colon. The  polyp was flat. The polyp was removed with a cold                            snare. Resection and retrieval were complete.                           There was a medium-sized lipoma, in the ascending                            colon.                           A 4 mm polyp was found in the hepatic flexure. The                            polyp was sessile. The polyp was removed with a                            cold snare. Resection and retrieval were complete.                           A 3 mm polyp was found in the sigmoid colon. The                            polyp was sessile. The polyp was removed with a                            cold snare. Resection and retrieval were complete.                           Multiple medium-mouthed diverticula were found in                            the sigmoid colon and descending colon.                            Internal hemorrhoids were found during retroflexion.                           The exam was otherwise without abnormality. Complications:            No immediate complications. Estimated blood loss:                            Minimal. Estimated Blood Loss:     Estimated blood loss was minimal. Impression:               - One diminutive polyp in the cecum, removed with a                            cold snare. Resected and retrieved.                           - One 15 mm polyp in the ascending colon, removed  with a cold snare. Resected and retrieved.                           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                           - One 4 mm polyp at the hepatic flexure, removed                            with a cold snare. Resected and retrieved.                           - One 3 mm polyp in the sigmoid colon, removed with                            a cold snare. Resected and retrieved.                           - Diverticulosis in the sigmoid colon and in the                            descending colon.                           - Internal hemorrhoids.                           - The examination was otherwise normal. Recommendation:           - Patient has a contact number available for                            emergencies. The signs and symptoms of potential                            delayed complications were discussed with the                            patient. Return to normal activities tomorrow.                            Written discharge instructions were provided to the                            patient.                           - Resume previous diet.                           - Continue present medications.                           - Await pathology results.                           -  Repeat colonoscopy is recommended for                            surveillance. The colonoscopy date will be                             determined after pathology results from today's                            exam become available for review.                           - No ibuprofen, naproxen, or other non-steroidal                            anti-inflammatory drugs for 2 weeks after polyp                            removal. Remo Lipps P. Armbruster MD, MD 05/01/2017 11:12:56 AM This report has been signed electronically.

## 2017-05-01 NOTE — Progress Notes (Signed)
Called to room to assist during endoscopic procedure.  Patient ID and intended procedure confirmed with present staff. Received instructions for my participation in the procedure from the performing physician.  

## 2017-05-01 NOTE — Patient Instructions (Signed)
YOU HAD AN ENDOSCOPIC PROCEDURE TODAY AT [facility]:   Refer to the procedure report that was given to you for any specific questions about what was found during the examination.  If the procedure report does not answer your questions, please call your gastroenterologist to clarify.  If you requested that your care partner not be given the details of your procedure findings, then the procedure report has been included in a sealed envelope for you to review at your convenience later.  YOU SHOULD EXPECT: Some feelings of bloating in the abdomen. Passage of more gas than usual.  Walking can help get rid of the air that was put into your GI tract during the procedure and reduce the bloating. If you had a lower endoscopy (such as a colonoscopy or flexible sigmoidoscopy) you may notice spotting of blood in your stool or on the toilet paper. If you underwent a bowel prep for your procedure, you may not have a normal bowel movement for a few days.  Please Note:  You might notice some irritation and congestion in your nose or some drainage.  This is from the oxygen used during your procedure.  There is no need for concern and it should clear up in a day or so.  SYMPTOMS TO REPORT IMMEDIATELY:   Following lower endoscopy (colonoscopy or flexible sigmoidoscopy):  Excessive amounts of blood in the stool  Significant tenderness or worsening of abdominal pains  Swelling of the abdomen that is new, acute  Fever of 100F or higher  No NSAIDs , ibuprofen, motrin or ASA products for 2 weeks. Tylenol is ok. Please see handouts given to you on Polyps, Hemorrhoids and Diverticulosis.   For urgent or emergent issues, a gastroenterologist can be reached at any hour by calling [phone].   DIET:  We do recommend a small meal at first, but then you may proceed to your regular diet.  Drink plenty of fluids but you should avoid alcoholic beverages for 24 hours.  ACTIVITY:  You should plan to take  it easy for the rest of today and you should NOT DRIVE or use heavy machinery until tomorrow (because of the sedation medicines used during the test).    FOLLOW UP: Our staff will call the number listed on your records the next business day following your procedure to check on you and address any questions or concerns that you may have regarding the information given to you following your procedure. If we do not reach you, we will leave a message.  However, if you are feeling well and you are not experiencing any problems, there is no need to return our call.  We will assume that you have returned to your regular daily activities without incident.  If any biopsies were taken you will be contacted by phone or by letter within the next 1-3 weeks.  Please call us at [phone] if you have not heard about the biopsies in 3 weeks.    SIGNATURES/CONFIDENTIALITY: You and/or your care partner have signed paperwork which will be entered into your electronic medical record.  These signatures attest to the fact that that the information above on your After Visit Summary has been reviewed and is understood.  Full responsibility of the confidentiality of this discharge information lies with you and/or your care-partner.  Thank you for letting us take care of your healthcare needs today.

## 2017-05-01 NOTE — Progress Notes (Signed)
Pt is in a boot on left foot.  Pt had surgery Apr 21, 2017 for a  tendon release left foot.  maw

## 2017-05-01 NOTE — Progress Notes (Signed)
To recovery, report to RN, VSS. 

## 2017-05-04 ENCOUNTER — Telehealth: Payer: Self-pay | Admitting: *Deleted

## 2017-05-04 ENCOUNTER — Telehealth: Payer: Self-pay

## 2017-05-04 NOTE — Telephone Encounter (Signed)
Left message on answering machine. 

## 2017-05-04 NOTE — Telephone Encounter (Signed)
  Follow up Call-  Call back number 05/01/2017  Post procedure Call Back phone  # [phone] cell  Permission to leave phone message Yes  Some recent data might be hidden     Patient questions:  Do you have a fever, pain , or abdominal swelling? No. Pain Score  0 *  Have you tolerated food without any problems? Yes.    Have you been able to return to your normal activities? Yes.    Do you have any questions about your discharge instructions: Diet   No. Medications  No. Follow up visit  No.  Do you have questions or concerns about your Care? No.  Actions: * If pain score is 4 or above: No action needed, pain <4.

## 2017-05-06 ENCOUNTER — Encounter: Payer: Self-pay | Admitting: Gastroenterology

## 2017-05-13 ENCOUNTER — Other Ambulatory Visit: Payer: BLUE CROSS/BLUE SHIELD

## 2017-05-13 ENCOUNTER — Encounter: Payer: Self-pay | Admitting: Podiatry

## 2017-05-13 ENCOUNTER — Ambulatory Visit (INDEPENDENT_AMBULATORY_CARE_PROVIDER_SITE_OTHER): Payer: BLUE CROSS/BLUE SHIELD | Admitting: Podiatry

## 2017-05-13 ENCOUNTER — Other Ambulatory Visit: Payer: Self-pay

## 2017-05-13 VITALS — BP 162/84 | HR 83 | Resp 16

## 2017-05-13 DIAGNOSIS — D169 Benign neoplasm of bone and articular cartilage, unspecified: Secondary | ICD-10-CM

## 2017-05-13 DIAGNOSIS — E782 Mixed hyperlipidemia: Secondary | ICD-10-CM

## 2017-05-13 DIAGNOSIS — Z79899 Other long term (current) drug therapy: Secondary | ICD-10-CM

## 2017-05-13 DIAGNOSIS — M722 Plantar fascial fibromatosis: Secondary | ICD-10-CM

## 2017-05-13 NOTE — Progress Notes (Signed)
Subjective:   Patient ID: [name], male   DOB: 57 y.o.   MRN: [id number]   HPI Patient states doing really well with a lesion on the right second toe which is been painful recently the left heel is doing great   ROS      Objective:  Physical Exam  Neurovascular status intact with significant diminishment of plantar discomfort left stitches intact wound edges well coapted and keratotic lesion with pain second digit right medial side     Assessment:  Exostosis second digit right medial side with patient doing well from endoscopic surgery left     Plan:  Debridement of lesion second digit right with no iatrogenic bleeding discussed hammertoe deformity and I went ahead and remove stitches left and instructed on gradual return to soft shoes and will be seen back as needed

## 2017-05-14 LAB — BASIC METABOLIC PANEL WITH GFR
BUN: 24 mg/dL (ref 7–25)
CALCIUM: 10 mg/dL (ref 8.6–10.3)
CHLORIDE: 102 mmol/L (ref 98–110)
CO2: 30 mmol/L (ref 20–32)
Creat: 1.04 mg/dL (ref 0.70–1.33)
GFR, Est African American: 91 mL/min/{1.73_m2} (ref >=60)
GFR, Est Non African American: 78 mL/min/{1.73_m2} (ref >=60)
Glucose, Bld: 86 mg/dL (ref 65–99)
POTASSIUM: 3.9 mmol/L (ref 3.5–5.3)
Sodium: 139 mmol/L (ref 135–146)

## 2017-05-21 DIAGNOSIS — H2513 Age-related nuclear cataract, bilateral: Secondary | ICD-10-CM | POA: Diagnosis not present

## 2017-05-21 DIAGNOSIS — H04123 Dry eye syndrome of bilateral lacrimal glands: Secondary | ICD-10-CM | POA: Diagnosis not present

## 2017-06-04 ENCOUNTER — Telehealth: Payer: Self-pay | Admitting: Podiatry

## 2017-06-04 NOTE — Telephone Encounter (Signed)
I spoke with pt and informed that he could expect some tenderness and swelling on and off to varying degrees, but not extreme soreness, I asked pt if it was exercise walking or day-to-day walking that was causing the pain. 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 I asked if pt was in lawyer shoes or athletic shoes and he said lawyer shoes with inserts and the foot did feel better in his athletic shoes, and he went in to his regular shoes a 3 weeks. I told pt his work schedule and the abruptness of his transition to regular shoes had irritated his surgery foot. I told him when having a pt gradually transition into regular shoes/athletic shoes, we have the pt wear the surgery boot outside of the house and wear a regular/athletic shoe at home until uncomfortable then switch back to the boot, and may switch again as comfort allows. Pt states he may have been too aggressive in the transition to regular shoes and activity, and will try to slow it down. I told pt he could also elevate and ice the area 3-4 times daily for comfort and swelling relief.

## 2017-06-04 NOTE — Telephone Encounter (Signed)
I'm a pt of Dr. Mellody Drown. I had plantar fascia surgery in the middle of February and the stitches were taken out the middle of March. I'm still having a heck of a time trying to walk. It just stays extremely sore. I don't know if that is normal or not? I'm just trying to find out if its normal, so be it, and if its abnormal I need to know what to do about it. If you would just give me a call on my cell [phone]. Thank you for your time.

## 2017-06-22 DIAGNOSIS — G4733 Obstructive sleep apnea (adult) (pediatric): Secondary | ICD-10-CM | POA: Diagnosis not present

## 2017-06-22 NOTE — Patient Instructions (Signed)
Recommend Adult Low Dose Aspirin or  coated  Aspirin 81 mg daily  To reduce risk of Colon Cancer 20 %,  Skin Cancer 26 % ,  Melanoma 46%  and  Pancreatic cancer 60% +++++++++++++++++++++++++ Vitamin D goal  is between 70-100.  Please make sure that you are taking your Vitamin D as directed.  It is very important as a natural anti-inflammatory  helping hair, skin, and nails, as well as reducing stroke and heart attack risk.  It helps your bones and helps with mood. It also decreases numerous cancer risks so please take it as directed.  Low Vit D is associated with a 200-300% higher risk for CANCER  and 200-300% higher risk for HEART   ATTACK  &  STROKE.   ...................................... It is also associated with higher death rate at younger ages,  autoimmune diseases like Rheumatoid arthritis, Lupus, Multiple Sclerosis.    Also many other serious conditions, like depression, Alzheimer's Dementia, infertility, muscle aches, fatigue, fibromyalgia - just to name a few. ++++++++++++++++++++ Recommend the book "The END of DIETING" by Dr Leland Fuhrman  & the book "The END of DIABETES " by Dr Marqueze Fuhrman At [employer] - get book & Audio CD's    Being diabetic has a  300% increased risk for heart attack, stroke, cancer, and alzheimer- type vascular dementia. It is very important that you work harder with diet by avoiding all foods that are white. Avoid white rice (brown & wild rice is OK), white potatoes (sweetpotatoes in moderation is OK), White bread or wheat bread or anything made out of white flour like bagels, donuts, rolls, buns, biscuits, cakes, pastries, cookies, pizza crust, and pasta (made from white flour & egg whites) - vegetarian pasta or spinach or wheat pasta is OK. Multigrain breads like Arnold's or Pepper[employer], or multigrain sandwich thins or flatbreads.  Diet, exercise and weight loss can reverse and cure diabetes in the early stages.  Diet, exercise and weight loss is  very important in the control and prevention of complications of diabetes which affects every system in your body, ie. Brain - dementia/stroke, eyes - glaucoma/blindness, heart - heart attack/heart failure, kidneys - dialysis, stomach - gastric paralysis, intestines - malabsorption, nerves - severe painful neuritis, circulation - gangrene & loss of a leg(s), and finally cancer and Alzheimers.    I recommend avoid fried & greasy foods,  sweets/candy, white rice (brown or wild rice or Quinoa is OK), white potatoes (sweet potatoes are OK) - anything made from white flour - bagels, doughnuts, rolls, buns, biscuits,white and wheat breads, pizza crust and traditional pasta made of white flour & egg white(vegetarian pasta or spinach or wheat pasta is OK).  Multi-grain bread is OK - like multi-grain flat bread or sandwich thins. Avoid alcohol in excess. Exercise is also important.    Eat all the vegetables you want - avoid meat, especially red meat and dairy - especially cheese.  Cheese is the most concentrated form of trans-fats which is the worst thing to clog up our arteries. Veggie cheese is OK which can be found in the fresh produce section at [employer] or [employer] or [employer]  +++++++++++++++++++++ DASH Eating Plan  DASH stands for "Dietary Approaches to Stop Hypertension."   The DASH eating plan is a healthy eating plan that has been shown to reduce high blood pressure (hypertension). Additional health benefits may include reducing the risk of type 2 diabetes mellitus, heart disease, and stroke. The DASH eating plan may also   help with weight loss. WHAT DO I NEED TO KNOW ABOUT THE DASH EATING PLAN? For the DASH eating plan, you will follow these general guidelines:  Choose foods with a percent daily value for sodium of less than 5% (as listed on the food label).  Use salt-free seasonings or herbs instead of table salt or sea salt.  Check with your health care provider or pharmacist before  using salt substitutes.  Eat lower-sodium products, often labeled as "lower sodium" or "no salt added."  Eat fresh foods.  Eat more vegetables, fruits, and low-fat dairy products.  Choose whole grains. Look for the word "whole" as the first word in the ingredient list.  Choose fish   Limit sweets, desserts, sugars, and sugary drinks.  Choose heart-healthy fats.  Eat veggie cheese   Eat more home-cooked food and less restaurant, buffet, and fast food.  Limit fried foods.  Cook foods using methods other than frying.  Limit canned vegetables. If you do use them, rinse them well to decrease the sodium.  When eating at a restaurant, ask that your food be prepared with less salt, or no salt if possible.                      WHAT FOODS CAN I EAT? Read Dr Jaxx Fuhrman's books on The End of Dieting & The End of Diabetes  Grains Whole grain or whole wheat bread. Brown rice. Whole grain or whole wheat pasta. Quinoa, bulgur, and whole grain cereals. Low-sodium cereals. Corn or whole wheat flour tortillas. Whole grain cornbread. Whole grain crackers. Low-sodium crackers.  Vegetables Fresh or frozen vegetables (raw, steamed, roasted, or grilled). Low-sodium or reduced-sodium tomato and vegetable juices. Low-sodium or reduced-sodium tomato sauce and paste. Low-sodium or reduced-sodium canned vegetables.   Fruits All fresh, canned (in natural juice), or frozen fruits.  Protein Products  All fish and seafood.  Dried beans, peas, or lentils. Unsalted nuts and seeds. Unsalted canned beans.  Dairy Low-fat dairy products, such as skim or 1% milk, 2% or reduced-fat cheeses, low-fat ricotta or cottage cheese, or plain low-fat yogurt. Low-sodium or reduced-sodium cheeses.  Fats and Oils Tub margarines without trans fats. Light or reduced-fat mayonnaise and salad dressings (reduced sodium). Avocado. Safflower, olive, or canola oils. Natural peanut or almond butter.  Other Unsalted popcorn  and pretzels. The items listed above may not be a complete list of recommended foods or beverages. Contact your dietitian for more options.  +++++++++++++++  WHAT FOODS ARE NOT RECOMMENDED? Grains/ White flour or wheat flour White bread. White pasta. White rice. Refined cornbread. Bagels and croissants. Crackers that contain trans fat.  Vegetables  Creamed or fried vegetables. Vegetables in a . Regular canned vegetables. Regular canned tomato sauce and paste. Regular tomato and vegetable juices.  Fruits Dried fruits. Canned fruit in light or heavy syrup. Fruit juice.  Meat and Other Protein Products Meat in general - RED meat & White meat.  Fatty cuts of meat. Ribs, chicken wings, all processed meats as bacon, sausage, bologna, salami, fatback, hot dogs, bratwurst and packaged luncheon meats.  Dairy Whole or 2% milk, cream, half-and-half, and cream cheese. Whole-fat or sweetened yogurt. Full-fat cheeses or blue cheese. Non-dairy creamers and whipped toppings. Processed cheese, cheese spreads, or cheese curds.  Condiments Onion and garlic salt, seasoned salt, table salt, and sea salt. Canned and packaged gravies. Worcestershire sauce. Tartar sauce. Barbecue sauce. Teriyaki sauce. Soy sauce, including reduced sodium. Steak sauce. Fish sauce. Oyster sauce. Cocktail   sauce. Horseradish. Ketchup and mustard. Meat flavorings and tenderizers. Bouillon cubes. Hot sauce. Tabasco sauce. Marinades. Taco seasonings. Relishes.  Fats and Oils Butter, stick margarine, lard, shortening and bacon fat. Coconut, palm kernel, or palm oils. Regular salad dressings.  Pickles and olives. Salted popcorn and pretzels.  The items listed above may not be a complete list of foods and beverages to avoid.   
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
## 2017-06-23 ENCOUNTER — Ambulatory Visit (INDEPENDENT_AMBULATORY_CARE_PROVIDER_SITE_OTHER): Payer: BLUE CROSS/BLUE SHIELD | Admitting: Internal Medicine

## 2017-06-23 ENCOUNTER — Encounter: Payer: Self-pay | Admitting: Internal Medicine

## 2017-06-23 VITALS — BP 126/84 | HR 80 | Temp 97.5°F | Resp 16 | Ht 65.5 in | Wt 201.4 lb

## 2017-06-23 DIAGNOSIS — E349 Endocrine disorder, unspecified: Secondary | ICD-10-CM

## 2017-06-23 DIAGNOSIS — Z9989 Dependence on other enabling machines and devices: Secondary | ICD-10-CM

## 2017-06-23 DIAGNOSIS — R7303 Prediabetes: Secondary | ICD-10-CM | POA: Diagnosis not present

## 2017-06-23 DIAGNOSIS — R0989 Other specified symptoms and signs involving the circulatory and respiratory systems: Secondary | ICD-10-CM

## 2017-06-23 DIAGNOSIS — Z79899 Other long term (current) drug therapy: Secondary | ICD-10-CM

## 2017-06-23 DIAGNOSIS — E559 Vitamin D deficiency, unspecified: Secondary | ICD-10-CM | POA: Diagnosis not present

## 2017-06-23 DIAGNOSIS — G4733 Obstructive sleep apnea (adult) (pediatric): Secondary | ICD-10-CM

## 2017-06-23 DIAGNOSIS — E782 Mixed hyperlipidemia: Secondary | ICD-10-CM | POA: Diagnosis not present

## 2017-06-25 ENCOUNTER — Ambulatory Visit (INDEPENDENT_AMBULATORY_CARE_PROVIDER_SITE_OTHER): Payer: BLUE CROSS/BLUE SHIELD | Admitting: Podiatry

## 2017-06-25 ENCOUNTER — Ambulatory Visit (INDEPENDENT_AMBULATORY_CARE_PROVIDER_SITE_OTHER): Payer: BLUE CROSS/BLUE SHIELD

## 2017-06-25 ENCOUNTER — Encounter: Payer: Self-pay | Admitting: Podiatry

## 2017-06-25 DIAGNOSIS — M722 Plantar fascial fibromatosis: Secondary | ICD-10-CM

## 2017-06-25 MED ORDER — TRIAMCINOLONE ACETONIDE 10 MG/ML IJ SUSP
10.0000 mg | Freq: Once | INTRAMUSCULAR | Status: AC
  Administered 2017-06-25: 10 mg

## 2017-06-25 NOTE — Progress Notes (Signed)
Subjective:   Patient ID: [name], male   DOB: 57 y.o.   MRN: [id number]   HPI Patient presents with pain in the left arch stating that the heel was doing real well but the arch is bothering him   ROS      Objective:  Physical Exam  Neurovascular status intact with patient's left heel doing very well but quite a bit of discomfort which is started in the middle of the arch with patient having been very active on his foot and walking at a golf turn     Assessment:  Inflammatory fasciitis of the left mid arch with previous endoscopic heel surgery which is done well     Plan:  Careful cortisone injection administered into the mid arch area left applied fascial strapping and instructed on home strapping and boot and ice therapy.  Reappoint if symptoms persist over the next 3 to 4 weeks

## 2017-06-29 ENCOUNTER — Other Ambulatory Visit: Payer: Self-pay | Admitting: Internal Medicine

## 2017-06-29 LAB — CBC WITH DIFFERENTIAL/PLATELET
BASOS PCT: 1.5 %
Basophils Absolute: 93 cells/uL (ref 0–200)
EOS PCT: 3.1 %
Eosinophils Absolute: 192 cells/uL (ref 15–500)
HEMATOCRIT: 49.8 % (ref 38.5–50.0)
Hemoglobin: 17.2 g/dL — ABNORMAL HIGH (ref 13.2–17.1)
LYMPHS ABS: 1308 {cells}/uL (ref 850–3900)
MCH: 31.9 pg (ref 27.0–33.0)
MCHC: 34.5 g/dL (ref 32.0–36.0)
MCV: 92.4 fL (ref 80.0–100.0)
MPV: 11.6 fL (ref 7.5–12.5)
Monocytes Relative: 11 %
NEUTROS ABS: 3925 {cells}/uL (ref 1500–7800)
NEUTROS PCT: 63.3 %
Platelets: 209 10*3/uL (ref 140–400)
RBC: 5.39 10*6/uL (ref 4.20–5.80)
RDW: 12.2 % (ref 11.0–15.0)
Total Lymphocyte: 21.1 %
WBC mixed population: 682 cells/uL (ref 200–950)
WBC: 6.2 10*3/uL (ref 3.8–10.8)

## 2017-06-29 LAB — COMPLETE METABOLIC PANEL WITH GFR
AG Ratio: 1.6 (calc) (ref 1.0–2.5)
ALT: 24 U/L (ref 9–46)
AST: 24 U/L (ref 10–35)
Albumin: 4.5 g/dL (ref 3.6–5.1)
Alkaline phosphatase (APISO): 53 U/L (ref 40–115)
BUN: 25 mg/dL (ref 7–25)
CALCIUM: 10.2 mg/dL (ref 8.6–10.3)
CO2: 29 mmol/L (ref 20–32)
CREATININE: 1.03 mg/dL (ref 0.70–1.33)
Chloride: 100 mmol/L (ref 98–110)
GFR, EST NON AFRICAN AMERICAN: 79 mL/min/{1.73_m2} (ref >=60)
GFR, Est African American: 92 mL/min/{1.73_m2} (ref >=60)
GLOBULIN: 2.8 g/dL (ref 1.9–3.7)
Glucose, Bld: 89 mg/dL (ref 65–99)
Potassium: 4.1 mmol/L (ref 3.5–5.3)
SODIUM: 138 mmol/L (ref 135–146)
TOTAL PROTEIN: 7.3 g/dL (ref 6.1–8.1)
Total Bilirubin: 0.4 mg/dL (ref 0.2–1.2)

## 2017-06-29 LAB — TSH: TSH: 1.07 mIU/L (ref 0.40–4.50)

## 2017-06-29 LAB — HEMOGLOBIN A1C
Hgb A1c MFr Bld: 5.2 % of total Hgb (ref <5.7)
Mean Plasma Glucose: 103 (calc)
eAG (mmol/L): 5.7 (calc)

## 2017-06-29 LAB — LIPID PANEL
CHOL/HDL RATIO: 4.6 (calc) (ref <5.0)
CHOLESTEROL: 221 mg/dL — AB (ref <200)
HDL: 48 mg/dL (ref >40)
LDL CHOLESTEROL (CALC): 128 mg/dL — AB
Non-HDL Cholesterol (Calc): 173 mg/dL (calc) — ABNORMAL HIGH (ref <130)
Triglycerides: 318 mg/dL — ABNORMAL HIGH (ref <150)

## 2017-06-29 LAB — TESTOSTERONE: TESTOSTERONE: 1390 ng/dL — AB (ref 250–827)

## 2017-06-29 LAB — INSULIN, RANDOM: Insulin: 14.2 u[IU]/mL (ref 2.0–19.6)

## 2017-06-29 LAB — MAGNESIUM: Magnesium: 2 mg/dL (ref 1.5–2.5)

## 2017-06-29 LAB — VITAMIN D 25 HYDROXY (VIT D DEFICIENCY, FRACTURES): Vit D, 25-Hydroxy: 73 ng/mL (ref 30–100)

## 2017-07-07 ENCOUNTER — Ambulatory Visit: Payer: BLUE CROSS/BLUE SHIELD | Admitting: Pulmonary Disease

## 2017-07-07 ENCOUNTER — Encounter: Payer: Self-pay | Admitting: Pulmonary Disease

## 2017-07-07 DIAGNOSIS — Z9989 Dependence on other enabling machines and devices: Secondary | ICD-10-CM

## 2017-07-07 DIAGNOSIS — G4733 Obstructive sleep apnea (adult) (pediatric): Secondary | ICD-10-CM | POA: Diagnosis not present

## 2017-07-07 NOTE — Patient Instructions (Signed)
CPAP 10 cm is working well

## 2017-07-07 NOTE — Assessment & Plan Note (Signed)
CPAP 10 cm seems to be working well. We discussed newer types of masks  Weight loss encouraged, compliance with goal of at least 4-6 hrs every night is the expectation. Advised against medications with sedative side effects Cautioned against driving when sleepy - understanding that sleepiness will vary on a day to day basis

## 2017-07-07 NOTE — Progress Notes (Signed)
   Subjective:    Patient ID: [name], male    DOB: 26-Aug-1960, 57 y.o.   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 He has been on CPAP about 5 years and this is really helped his daytime somnolence and fatigue. He has lost about 15 pounds to his current weight of 200 pounds.  He underwent knee replacement surgery 2 years ago.  He was on high-dose narcotics and has been able to wean himself off  this.  He uses clonazepam for anxiety and sleep about 5 days a week He has settled with full facemask. CPAP download was reviewed which shows excellent control of events on 10 cm with minimal leak and good compliance without any missed days  He has no problems with mask or pressure or dryness or humidity  Significant tests/ events reviewed  HST 11/2012:  AHI 37/hr with desat to 72%  Review of Systems Patient denies significant dyspnea,cough, hemoptysis,  chest pain, palpitations, pedal edema, orthopnea, paroxysmal nocturnal dyspnea, lightheadedness, nausea, vomiting, abdominal or  leg pains      Objective:   Physical Exam  Gen. Pleasant, obese, in no distress ENT - class 2 airway, no post nasal drip Neck: No JVD, no thyromegaly, no carotid bruits Lungs: no use of accessory muscles, no dullness to percussion, decreased without rales or rhonchi  Cardiovascular: Rhythm regular, heart sounds  normal, no murmurs or gallops, no peripheral edema Musculoskeletal: No deformities, no cyanosis or clubbing , no tremors       Assessment & Plan:

## 2017-07-08 ENCOUNTER — Other Ambulatory Visit: Payer: Self-pay | Admitting: Internal Medicine

## 2017-07-08 DIAGNOSIS — F411 Generalized anxiety disorder: Secondary | ICD-10-CM

## 2017-07-21 DIAGNOSIS — L570 Actinic keratosis: Secondary | ICD-10-CM | POA: Diagnosis not present

## 2017-07-21 DIAGNOSIS — D225 Melanocytic nevi of trunk: Secondary | ICD-10-CM | POA: Diagnosis not present

## 2017-07-21 DIAGNOSIS — D2362 Other benign neoplasm of skin of left upper limb, including shoulder: Secondary | ICD-10-CM | POA: Diagnosis not present

## 2017-07-21 DIAGNOSIS — D2372 Other benign neoplasm of skin of left lower limb, including hip: Secondary | ICD-10-CM | POA: Diagnosis not present

## 2017-07-21 DIAGNOSIS — D239 Other benign neoplasm of skin, unspecified: Secondary | ICD-10-CM | POA: Diagnosis not present

## 2017-08-24 ENCOUNTER — Other Ambulatory Visit: Payer: Self-pay | Admitting: Internal Medicine

## 2017-08-24 DIAGNOSIS — N529 Male erectile dysfunction, unspecified: Secondary | ICD-10-CM

## 2017-08-24 MED ORDER — TADALAFIL 20 MG PO TABS: ORAL_TABLET | ORAL | 3 refills | Status: DC

## 2017-09-17 ENCOUNTER — Encounter: Payer: Self-pay | Admitting: Podiatry

## 2017-09-17 ENCOUNTER — Ambulatory Visit: Payer: BLUE CROSS/BLUE SHIELD | Admitting: Podiatry

## 2017-09-17 DIAGNOSIS — M722 Plantar fascial fibromatosis: Secondary | ICD-10-CM | POA: Diagnosis not present

## 2017-09-17 MED ORDER — TRIAMCINOLONE ACETONIDE 10 MG/ML IJ SUSP
10.0000 mg | Freq: Once | INTRAMUSCULAR | Status: AC
  Administered 2017-09-17: 10 mg

## 2017-09-17 NOTE — Progress Notes (Signed)
Subjective:   Patient ID: [name], male   DOB: 57 y.o.   MRN: [id number]   HPI Patient states he is extremely active in his heel no longer hurts but is having a lot of pain still in the arch neurovascular status intact with inflammation of the mid arch area left foot fluid buildup noted mild discomfort lateral side of the foot mostly within the arch   ROS      Objective:  Physical Exam  Pain to palpation of the mid arch area left with slight pain on the lateral side also     Assessment:  Acute plantar fasciitis with retraction of the tendon most likely after the endoscopic surgery with a inflammatory pattern noted     Plan:  At this point I dispensed a night splint with instructions on heat ice therapy to be done within the splint I did do mid arch injection 3 mg Kenalog 5 mg Xylocaine and reappoint 6 weeks to reevaluate

## 2017-09-18 ENCOUNTER — Other Ambulatory Visit: Payer: Self-pay | Admitting: Internal Medicine

## 2017-09-23 ENCOUNTER — Encounter: Payer: Self-pay | Admitting: Internal Medicine

## 2017-09-23 ENCOUNTER — Ambulatory Visit (INDEPENDENT_AMBULATORY_CARE_PROVIDER_SITE_OTHER): Payer: BLUE CROSS/BLUE SHIELD | Admitting: Adult Health

## 2017-09-23 VITALS — BP 120/64 | HR 76 | Temp 97.9°F | Resp 16 | Ht 66.0 in | Wt 193.6 lb

## 2017-09-23 DIAGNOSIS — E559 Vitamin D deficiency, unspecified: Secondary | ICD-10-CM | POA: Diagnosis not present

## 2017-09-23 DIAGNOSIS — E782 Mixed hyperlipidemia: Secondary | ICD-10-CM

## 2017-09-23 DIAGNOSIS — E349 Endocrine disorder, unspecified: Secondary | ICD-10-CM | POA: Diagnosis not present

## 2017-09-23 DIAGNOSIS — Z79899 Other long term (current) drug therapy: Secondary | ICD-10-CM

## 2017-09-23 DIAGNOSIS — Z6833 Body mass index (BMI) 33.0-33.9, adult: Secondary | ICD-10-CM

## 2017-09-23 DIAGNOSIS — R7309 Other abnormal glucose: Secondary | ICD-10-CM

## 2017-09-23 DIAGNOSIS — R0989 Other specified symptoms and signs involving the circulatory and respiratory systems: Secondary | ICD-10-CM | POA: Diagnosis not present

## 2017-09-23 DIAGNOSIS — R7303 Prediabetes: Secondary | ICD-10-CM | POA: Diagnosis not present

## 2017-09-23 DIAGNOSIS — E6609 Other obesity due to excess calories: Secondary | ICD-10-CM
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
## 2017-09-23 NOTE — Progress Notes (Signed)
FOLLOW UP  Assessment and Plan:   Hypertension Well controlled with current medications  Monitor blood pressure at home; patient to call if consistently greater than 130/80 Continue DASH diet.   Reminder to go to the ER if any CP, SOB, nausea, dizziness, severe HA, changes vision/speech, left arm numbness and tingling and jaw pain.  Cholesterol Currently above goal; titrate statin to maximize; currently taking atorvastatin 40 mg 5 days a week, aggressively working on lifestyle   Continue low cholesterol diet and exercise.  Check lipid panel.   Other abnormal glucose Continue diet and exercise.  Perform daily foot/skin check, notify office of any concerning changes.  Check A1C  Obesity with co morbidities Long discussion about weight loss, diet, and exercise Recommended diet heavy in fruits and veggies and low in animal meats, cheeses, and dairy products, appropriate calorie intake Discussed ideal weight for height and initial weight goal (173lb) Will follow up in 3 months  Vitamin D Def At goal at last visit; continue supplementation to maintain goal of 70-100 Check vitamin D level  Hypogonadism  continue replacement therapy, check testosterone levels as needed.   Continue diet and meds as discussed. Further disposition pending results of labs. Discussed med's effects and SE's.   Over 30 minutes of exam, counseling, chart review, and critical decision making was performed.   Future Appointments  Date Time Provider [place]  10/29/2017  8:15 AM Wallene Huh, DPM [facility]  04/06/2018  2:00 PM [name], MD [facility] None  07/09/2018  9:00 AM Parrett, Fonnie Mu, NP [facility] None    ----------------------------------------------------------------------------------------------------------------------  HPI 57 y.o. male  presents for 3 month follow up on hypertension, cholesterol, glucose management, obesity, testosterone deficiency and vitamin D  deficiency.   BMI is Body mass index is 31.25 kg/m., he has been working on diet and exercise, he does martial arts [employer]), has cut out sugar and flour. He has lost 24 lb since this January and plans to lose at least 20 more lb.  Wt Readings from Last 3 Encounters:  09/23/17 193 lb 9.6 oz (87.8 kg)  07/07/17 200 lb (90.7 kg)  06/23/17 201 lb 6.4 oz (91.4 kg)   His blood pressure has been controlled at home, today their BP is BP: 120/64  He does workout. He denies chest pain, shortness of breath, dizziness.   He is on cholesterol medication (atorvastatin 40 mg 5 days a week) and denies myalgias. His cholesterol is not at goal. The cholesterol last visit was:   Lab Results  Component Value Date   CHOL 221 (H) 06/23/2017   HDL 48 06/23/2017   LDLCALC 128 (H) 06/23/2017   TRIG 318 (H) 06/23/2017   CHOLHDL 4.6 06/23/2017    He has been working on diet and exercise for prediabetes, and denies foot ulcerations, increased appetite, nausea, paresthesia of the feet, polydipsia, polyuria, visual disturbances, vomiting and weight loss. Last A1C in the office was:  Lab Results  Component Value Date   HGBA1C 5.2 06/23/2017   Patient is on Vitamin D supplement.   Lab Results  Component Value Date   VD25OH 75 06/23/2017     He has a history of testosterone deficiency and is on testosterone replacement. He states that the testosterone helps with his energy, libido, muscle mass. His last injection was 09/18/2017.  Lab Results  Component Value Date   TESTOSTERONE 1,390 (H) 06/23/2017      Current Medications:  Current Outpatient Medications on File Prior to Visit  Medication Sig  . amLODipine (NORVASC) 10 MG tablet TAKE ONE-HALF TO ONE TABLET BY MOUTH DAILY AS DIRECTED FOR BLOOD PRESSURE.  Marland Kitchen atorvastatin (LIPITOR) 80 MG tablet take 1 tablet by mouth once daily for cholesterol (Patient taking differently: Take 40 mg by mouth every other day. take 1 tablet by mouth once daily for cholesterol)   . clonazePAM (KLONOPIN) 1 MG tablet TAKE ONE-HALF TO ONE TABLET BY MOUTH THREE TIMES A DAY AS NEEDED FOR ANXIETY  . losartan-hydrochlorothiazide (HYZAAR) 50-12.5 MG tablet Take 1 tablet by mouth daily.  . Multiple Vitamin (MULTIVITAMIN) tablet Take 1 tablet by mouth daily.  . Multiple Vitamins-Minerals (ZINC PO) Take by mouth.  . SYRINGE-NEEDLE, DISP, 3 ML (B-D 3CC LUER-LOK SYR 21GX1") 21G X 1" 3 ML MISC use as directed  . tadalafil (ADCIRCA/CIALIS) 20 MG tablet Take 1/2 to 1 tablet every 2 to 3 days as directed  . testosterone cypionate (DEPOTESTOSTERONE CYPIONATE) 200 MG/ML injection INJECT 1 ML INTO THE MUSCLE ONCE WEEKLY  . VITAMIN D, CHOLECALCIFEROL, PO Take 10,000 mg by mouth daily.   No current facility-administered medications on file prior to visit.      Allergies:  Allergies  Allergen Reactions  . Penicillins Anaphylaxis  . Ace Inhibitors Cough  . Keflex [Cephalexin] Hives  . Pregabalin     Other reaction(s): Confusion and depression     Medical History:  Past Medical History:  Diagnosis Date  . Allergy   . Anxiety   . Arthritis   . Chronic pain of left knee   . Diverticulitis   . Hyperlipidemia    pt takes meds, but denies cholesterol being elevated  . IBS (irritable bowel syndrome)   . Kidney stones   . Labile hypertension   . Other testicular hypofunction   . Prediabetes   . Sleep apnea    uses CPAP  . Vitamin D deficiency    Family history- Reviewed and unchanged Social history- Reviewed and unchanged   Review of Systems:  Review of Systems  Constitutional: Negative for malaise/fatigue and weight loss.  HENT: Negative for hearing loss and tinnitus.   Eyes: Negative for blurred vision and double vision.  Respiratory: Negative for cough, shortness of breath and wheezing.   Cardiovascular: Negative for chest pain, palpitations, orthopnea, claudication and leg swelling.  Gastrointestinal: Negative for abdominal pain, blood in stool, constipation,  diarrhea, heartburn, melena, nausea and vomiting.  Genitourinary: Negative.   Musculoskeletal: Positive for joint pain (left foot; recovering from tendon release). Negative for myalgias.  Skin: Negative for rash.  Neurological: Negative for dizziness, tingling, sensory change, weakness and headaches.  Endo/Heme/Allergies: Negative for polydipsia.  Psychiatric/Behavioral: Negative.   All other systems reviewed and are negative.   Physical Exam: BP 120/64   Pulse 76   Temp 97.9 F (36.6 C)   Resp 16   Ht 5\' 6"  (1.676 m)   Wt 193 lb 9.6 oz (87.8 kg)   SpO2 96%   BMI 31.25 kg/m  Wt Readings from Last 3 Encounters:  09/23/17 193 lb 9.6 oz (87.8 kg)  07/07/17 200 lb (90.7 kg)  06/23/17 201 lb 6.4 oz (91.4 kg)   General Appearance: Well nourished, in no apparent distress. Eyes: PERRLA, EOMs, conjunctiva no swelling or erythema Sinuses: No Frontal/maxillary tenderness ENT/Mouth: Ext aud canals clear, TMs without erythema, bulging. No erythema, swelling, or exudate on post pharynx.  Tonsils not swollen or erythematous. Hearing normal.  Neck: Supple, thyroid normal.  Respiratory: Respiratory effort normal, BS equal bilaterally without rales,  rhonchi, wheezing or stridor.  Cardio: RRR with no MRGs. Brisk peripheral pulses without edema.  Abdomen: Soft, + BS.  Non tender, no guarding, rebound, hernias, masses. Lymphatics: Non tender without lymphadenopathy.  Musculoskeletal: Full ROM, 5/5 strength, Normal gait Skin: Warm, dry without rashes, lesions, ecchymosis.  Neuro: Cranial nerves intact. No cerebellar symptoms.  Psych: Awake and oriented X 3, normal affect, Insight and Judgment appropriate.    Izora Ribas, NP 5:16 PM [facility]

## 2017-09-23 NOTE — Patient Instructions (Signed)
Recommend Adult Low Dose Aspirin or  coated  Aspirin 81 mg daily  To reduce risk of Colon Cancer 20 %,  Skin Cancer 26 % ,  Melanoma 46%  and  Pancreatic cancer 60% +++++++++++++++++++++++++ Vitamin D goal  is between 70-100.  Please make sure that you are taking your Vitamin D as directed.  It is very important as a natural anti-inflammatory  helping hair, skin, and nails, as well as reducing stroke and heart attack risk.  It helps your bones and helps with mood. It also decreases numerous cancer risks so please take it as directed.  Low Vit D is associated with a 200-300% higher risk for CANCER  and 200-300% higher risk for HEART   ATTACK  &  STROKE.   ...................................... It is also associated with higher death rate at younger ages,  autoimmune diseases like Rheumatoid arthritis, Lupus, Multiple Sclerosis.    Also many other serious conditions, like depression, Alzheimer's Dementia, infertility, muscle aches, fatigue, fibromyalgia - just to name a few. ++++++++++++++++++++ Recommend the book "The END of DIETING" by Dr Jervis Fuhrman  & the book "The END of DIABETES " by Dr Timmothy Fuhrman At [employer] - get book & Audio CD's    Being diabetic has a  300% increased risk for heart attack, stroke, cancer, and alzheimer- type vascular dementia. It is very important that you work harder with diet by avoiding all foods that are white. Avoid white rice (brown & wild rice is OK), white potatoes (sweetpotatoes in moderation is OK), White bread or wheat bread or anything made out of white flour like bagels, donuts, rolls, buns, biscuits, cakes, pastries, cookies, pizza crust, and pasta (made from white flour & egg whites) - vegetarian pasta or spinach or wheat pasta is OK. Multigrain breads like Arnold's or Pepperidge Farm, or multigrain sandwich thins or flatbreads.  Diet, exercise and weight loss can reverse and cure diabetes in the early stages.  Diet, exercise and weight loss is  very important in the control and prevention of complications of diabetes which affects every system in your body, ie. Brain - dementia/stroke, eyes - glaucoma/blindness, heart - heart attack/heart failure, kidneys - dialysis, stomach - gastric paralysis, intestines - malabsorption, nerves - severe painful neuritis, circulation - gangrene & loss of a leg(s), and finally cancer and Alzheimers.    I recommend avoid fried & greasy foods,  sweets/candy, white rice (brown or wild rice or Quinoa is OK), white potatoes (sweet potatoes are OK) - anything made from white flour - bagels, doughnuts, rolls, buns, biscuits,white and wheat breads, pizza crust and traditional pasta made of white flour & egg white(vegetarian pasta or spinach or wheat pasta is OK).  Multi-grain bread is OK - like multi-grain flat bread or sandwich thins. Avoid alcohol in excess. Exercise is also important.    Eat all the vegetables you want - avoid meat, especially red meat and dairy - especially cheese.  Cheese is the most concentrated form of trans-fats which is the worst thing to clog up our arteries. Veggie cheese is OK which can be found in the fresh produce section at [employer] or [employer] or [employer]  +++++++++++++++++++++ DASH Eating Plan  DASH stands for "Dietary Approaches to Stop Hypertension."   The DASH eating plan is a healthy eating plan that has been shown to reduce high blood pressure (hypertension). Additional health benefits may include reducing the risk of type 2 diabetes mellitus, heart disease, and stroke. The DASH eating plan may also   help with weight loss. WHAT DO I NEED TO KNOW ABOUT THE DASH EATING PLAN? For the DASH eating plan, you will follow these general guidelines:  Choose foods with a percent daily value for sodium of less than 5% (as listed on the food label).  Use salt-free seasonings or herbs instead of table salt or sea salt.  Check with your health care provider or pharmacist before  using salt substitutes.  Eat lower-sodium products, often labeled as "lower sodium" or "no salt added."  Eat fresh foods.  Eat more vegetables, fruits, and low-fat dairy products.  Choose whole grains. Look for the word "whole" as the first word in the ingredient list.  Choose fish   Limit sweets, desserts, sugars, and sugary drinks.  Choose heart-healthy fats.  Eat veggie cheese   Eat more home-cooked food and less restaurant, buffet, and fast food.  Limit fried foods.  Cook foods using methods other than frying.  Limit canned vegetables. If you do use them, rinse them well to decrease the sodium.  When eating at a restaurant, ask that your food be prepared with less salt, or no salt if possible.                      WHAT FOODS CAN I EAT? Read Dr Basilio Fuhrman's books on The End of Dieting & The End of Diabetes  Grains Whole grain or whole wheat bread. Brown rice. Whole grain or whole wheat pasta. Quinoa, bulgur, and whole grain cereals. Low-sodium cereals. Corn or whole wheat flour tortillas. Whole grain cornbread. Whole grain crackers. Low-sodium crackers.  Vegetables Fresh or frozen vegetables (raw, steamed, roasted, or grilled). Low-sodium or reduced-sodium tomato and vegetable juices. Low-sodium or reduced-sodium tomato sauce and paste. Low-sodium or reduced-sodium canned vegetables.   Fruits All fresh, canned (in natural juice), or frozen fruits.  Protein Products  All fish and seafood.  Dried beans, peas, or lentils. Unsalted nuts and seeds. Unsalted canned beans.  Dairy Low-fat dairy products, such as skim or 1% milk, 2% or reduced-fat cheeses, low-fat ricotta or cottage cheese, or plain low-fat yogurt. Low-sodium or reduced-sodium cheeses.  Fats and Oils Tub margarines without trans fats. Light or reduced-fat mayonnaise and salad dressings (reduced sodium). Avocado. Safflower, olive, or canola oils. Natural peanut or almond butter.  Other Unsalted popcorn  and pretzels. The items listed above may not be a complete list of recommended foods or beverages. Contact your dietitian for more options.  +++++++++++++++  WHAT FOODS ARE NOT RECOMMENDED? Grains/ White flour or wheat flour White bread. White pasta. White rice. Refined cornbread. Bagels and croissants. Crackers that contain trans fat.  Vegetables  Creamed or fried vegetables. Vegetables in a . Regular canned vegetables. Regular canned tomato sauce and paste. Regular tomato and vegetable juices.  Fruits Dried fruits. Canned fruit in light or heavy syrup. Fruit juice.  Meat and Other Protein Products Meat in general - RED meat & White meat.  Fatty cuts of meat. Ribs, chicken wings, all processed meats as bacon, sausage, bologna, salami, fatback, hot dogs, bratwurst and packaged luncheon meats.  Dairy Whole or 2% milk, cream, half-and-half, and cream cheese. Whole-fat or sweetened yogurt. Full-fat cheeses or blue cheese. Non-dairy creamers and whipped toppings. Processed cheese, cheese spreads, or cheese curds.  Condiments Onion and garlic salt, seasoned salt, table salt, and sea salt. Canned and packaged gravies. Worcestershire sauce. Tartar sauce. Barbecue sauce. Teriyaki sauce. Soy sauce, including reduced sodium. Steak sauce. Fish sauce. Oyster sauce. Cocktail   sauce. Horseradish. Ketchup and mustard. Meat flavorings and tenderizers. Bouillon cubes. Hot sauce. Tabasco sauce. Marinades. Taco seasonings. Relishes.  Fats and Oils Butter, stick margarine, lard, shortening and bacon fat. Coconut, palm kernel, or palm oils. Regular salad dressings.  Pickles and olives. Salted popcorn and pretzels.  The items listed above may not be a complete list of foods and beverages to avoid.   

## 2017-09-24 LAB — COMPLETE METABOLIC PANEL WITH GFR
AG RATIO: 2 (calc) (ref 1.0–2.5)
ALKALINE PHOSPHATASE (APISO): 51 U/L (ref 40–115)
ALT: 28 U/L (ref 9–46)
AST: 32 U/L (ref 10–35)
Albumin: 4.7 g/dL (ref 3.6–5.1)
BILIRUBIN TOTAL: 0.5 mg/dL (ref 0.2–1.2)
BUN: 22 mg/dL (ref 7–25)
CHLORIDE: 100 mmol/L (ref 98–110)
CO2: 31 mmol/L (ref 20–32)
Calcium: 10 mg/dL (ref 8.6–10.3)
Creat: 1.11 mg/dL (ref 0.70–1.33)
GFR, Est African American: 84 mL/min/{1.73_m2} (ref >=60)
GFR, Est Non African American: 72 mL/min/{1.73_m2} (ref >=60)
GLUCOSE: 91 mg/dL (ref 65–99)
Globulin: 2.4 g/dL (calc) (ref 1.9–3.7)
POTASSIUM: 4.1 mmol/L (ref 3.5–5.3)
Sodium: 139 mmol/L (ref 135–146)
Total Protein: 7.1 g/dL (ref 6.1–8.1)

## 2017-09-24 LAB — CBC WITH DIFFERENTIAL/PLATELET
BASOS ABS: 81 {cells}/uL (ref 0–200)
BASOS PCT: 1.1 %
EOS ABS: 111 {cells}/uL (ref 15–500)
EOS PCT: 1.5 %
HEMATOCRIT: 48.7 % (ref 38.5–50.0)
Hemoglobin: 16.8 g/dL (ref 13.2–17.1)
LYMPHS ABS: 1399 {cells}/uL (ref 850–3900)
MCH: 31.8 pg (ref 27.0–33.0)
MCHC: 34.5 g/dL (ref 32.0–36.0)
MCV: 92.1 fL (ref 80.0–100.0)
MONOS PCT: 11.1 %
MPV: 11.1 fL (ref 7.5–12.5)
NEUTROS ABS: 4988 {cells}/uL (ref 1500–7800)
Neutrophils Relative %: 67.4 %
Platelets: 210 10*3/uL (ref 140–400)
RBC: 5.29 10*6/uL (ref 4.20–5.80)
RDW: 12.2 % (ref 11.0–15.0)
Total Lymphocyte: 18.9 %
WBC mixed population: 821 cells/uL (ref 200–950)
WBC: 7.4 10*3/uL (ref 3.8–10.8)

## 2017-09-24 LAB — HEMOGLOBIN A1C
Hgb A1c MFr Bld: 5.2 % of total Hgb (ref <5.7)
Mean Plasma Glucose: 103 (calc)
eAG (mmol/L): 5.7 (calc)

## 2017-09-24 LAB — LIPID PANEL
CHOL/HDL RATIO: 4.3 (calc) (ref <5.0)
Cholesterol: 229 mg/dL — ABNORMAL HIGH (ref <200)
HDL: 53 mg/dL (ref >40)
LDL Cholesterol (Calc): 149 mg/dL (calc) — ABNORMAL HIGH
NON-HDL CHOLESTEROL (CALC): 176 mg/dL — AB (ref <130)
Triglycerides: 138 mg/dL (ref <150)

## 2017-09-24 LAB — TSH: TSH: 1.26 m[IU]/L (ref 0.40–4.50)

## 2017-09-24 LAB — VITAMIN D 25 HYDROXY (VIT D DEFICIENCY, FRACTURES): Vit D, 25-Hydroxy: 95 ng/mL (ref 30–100)

## 2017-09-24 LAB — MAGNESIUM: Magnesium: 2 mg/dL (ref 1.5–2.5)

## 2017-09-24 LAB — TESTOSTERONE: Testosterone: 1562 ng/dL — ABNORMAL HIGH (ref 250–827)

## 2017-09-24 LAB — INSULIN, RANDOM: Insulin: 3 u[IU]/mL (ref 2.0–19.6)

## 2017-10-06 ENCOUNTER — Other Ambulatory Visit: Payer: Self-pay | Admitting: Internal Medicine

## 2017-10-06 DIAGNOSIS — R0989 Other specified symptoms and signs involving the circulatory and respiratory systems: Secondary | ICD-10-CM

## 2017-10-12 ENCOUNTER — Encounter: Payer: Self-pay | Admitting: Internal Medicine

## 2017-10-12 ENCOUNTER — Ambulatory Visit (INDEPENDENT_AMBULATORY_CARE_PROVIDER_SITE_OTHER): Payer: BLUE CROSS/BLUE SHIELD | Admitting: Internal Medicine

## 2017-10-12 VITALS — BP 134/82 | HR 100 | Temp 97.8°F | Resp 18

## 2017-10-12 DIAGNOSIS — S65311A Laceration of deep palmar arch of right hand, initial encounter: Secondary | ICD-10-CM | POA: Diagnosis not present

## 2017-10-12 DIAGNOSIS — R0989 Other specified symptoms and signs involving the circulatory and respiratory systems: Secondary | ICD-10-CM
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
## 2017-10-22 ENCOUNTER — Ambulatory Visit: Payer: BLUE CROSS/BLUE SHIELD | Admitting: Internal Medicine

## 2017-10-22 VITALS — BP 138/62 | HR 84 | Temp 97.6°F | Ht 65.5 in | Wt 201.0 lb

## 2017-10-22 DIAGNOSIS — S65311D Laceration of deep palmar arch of right hand, subsequent encounter: Secondary | ICD-10-CM

## 2017-10-25 ENCOUNTER — Encounter: Payer: Self-pay | Admitting: Internal Medicine

## 2017-10-25 NOTE — Progress Notes (Signed)
   Patient returns for suture removal from a laceration of the Rt palm. Has had no problems    o/Ex    height is 5' 5.5" (1.664 m) and weight is 201 lb (91.2 kg). His temperature is 97.6 F (36.4 C). His blood pressure is 138/62 and his pulse is 84.   - wound clean, good healing ridge w/o signs of infection. Sutures removed w/o difficulty

## 2017-10-29 ENCOUNTER — Ambulatory Visit: Payer: BLUE CROSS/BLUE SHIELD | Admitting: Podiatry

## 2017-10-30 ENCOUNTER — Other Ambulatory Visit: Payer: Self-pay | Admitting: Internal Medicine

## 2017-10-30 DIAGNOSIS — K5732 Diverticulitis of large intestine without perforation or abscess without bleeding: Secondary | ICD-10-CM

## 2017-10-30 MED ORDER — CIPROFLOXACIN HCL 750 MG PO TABS: ORAL_TABLET | ORAL | 1 refills | Status: DC

## 2017-10-30 MED ORDER — TRAMADOL HCL 50 MG PO TABS: ORAL_TABLET | ORAL | 0 refills | Status: DC

## 2017-10-30 MED ORDER — DOXYCYCLINE HYCLATE 100 MG PO CAPS: ORAL_CAPSULE | ORAL | 1 refills | Status: DC

## 2017-11-05 ENCOUNTER — Other Ambulatory Visit: Payer: Self-pay | Admitting: Internal Medicine

## 2017-11-09 ENCOUNTER — Ambulatory Visit (INDEPENDENT_AMBULATORY_CARE_PROVIDER_SITE_OTHER): Payer: BLUE CROSS/BLUE SHIELD

## 2017-11-09 ENCOUNTER — Other Ambulatory Visit: Payer: Self-pay | Admitting: Podiatry

## 2017-11-09 ENCOUNTER — Encounter: Payer: Self-pay | Admitting: Podiatry

## 2017-11-09 ENCOUNTER — Ambulatory Visit: Payer: BLUE CROSS/BLUE SHIELD | Admitting: Podiatry

## 2017-11-09 DIAGNOSIS — M778 Other enthesopathies, not elsewhere classified: Secondary | ICD-10-CM

## 2017-11-09 DIAGNOSIS — M7752 Other enthesopathy of left foot: Secondary | ICD-10-CM | POA: Diagnosis not present

## 2017-11-09 DIAGNOSIS — M779 Enthesopathy, unspecified: Secondary | ICD-10-CM

## 2017-11-09 DIAGNOSIS — M79671 Pain in right foot: Secondary | ICD-10-CM

## 2017-11-09 MED ORDER — TRIAMCINOLONE ACETONIDE 10 MG/ML IJ SUSP
10.0000 mg | Freq: Once | INTRAMUSCULAR | Status: AC
  Administered 2017-11-09: 10 mg

## 2017-11-10 NOTE — Progress Notes (Signed)
Subjective:   Patient ID: [name], male   DOB: 57 y.o.   MRN: [id number]   HPI Patient states the arch pain has resolved (having this intense or discomfort on the dorsal lateral aspect of the left foot   ROS      Objective:  Physical Exam  Neurovascular status intact with patient's left foot showing acute inflammation tendinitis dorsal lateral foot which may be due to his activity levels and martial arts that he does     Assessment:  Acute extensor tendinitis left     Plan:  Continue ice stretching exercises and I did want injection dorsal lateral aspect left with 3 mg Kenalog 5 mg Xylocaine and we will see the results over the next month and may require further treatment depending on symptoms  X-rays were negative for signs of fracture or bone pathology

## 2017-12-05 ENCOUNTER — Other Ambulatory Visit: Payer: Self-pay | Admitting: Internal Medicine

## 2017-12-21 DIAGNOSIS — G4733 Obstructive sleep apnea (adult) (pediatric): Secondary | ICD-10-CM | POA: Diagnosis not present

## 2017-12-28 ENCOUNTER — Encounter: Payer: Self-pay | Admitting: Internal Medicine

## 2017-12-28 ENCOUNTER — Ambulatory Visit: Payer: Self-pay | Admitting: Adult Health

## 2017-12-28 ENCOUNTER — Ambulatory Visit (INDEPENDENT_AMBULATORY_CARE_PROVIDER_SITE_OTHER): Payer: BLUE CROSS/BLUE SHIELD | Admitting: Internal Medicine

## 2017-12-28 VITALS — BP 136/84 | HR 84 | Temp 97.7°F | Resp 16 | Ht 65.5 in | Wt 187.4 lb

## 2017-12-28 DIAGNOSIS — R7303 Prediabetes: Secondary | ICD-10-CM

## 2017-12-28 DIAGNOSIS — Z8249 Family history of ischemic heart disease and other diseases of the circulatory system: Secondary | ICD-10-CM | POA: Insufficient documentation

## 2017-12-28 DIAGNOSIS — E349 Endocrine disorder, unspecified: Secondary | ICD-10-CM | POA: Diagnosis not present

## 2017-12-28 DIAGNOSIS — E559 Vitamin D deficiency, unspecified: Secondary | ICD-10-CM | POA: Diagnosis not present

## 2017-12-28 DIAGNOSIS — I10 Essential (primary) hypertension: Secondary | ICD-10-CM | POA: Diagnosis not present

## 2017-12-28 DIAGNOSIS — Z79899 Other long term (current) drug therapy: Secondary | ICD-10-CM | POA: Diagnosis not present

## 2017-12-28 DIAGNOSIS — Z23 Encounter for immunization: Secondary | ICD-10-CM | POA: Diagnosis not present

## 2017-12-28 DIAGNOSIS — Z87891 Personal history of nicotine dependence: Secondary | ICD-10-CM | POA: Insufficient documentation

## 2017-12-28 DIAGNOSIS — E782 Mixed hyperlipidemia: Secondary | ICD-10-CM

## 2017-12-28 NOTE — Patient Instructions (Signed)
Recommend Adult Low Dose Aspirin or  coated  Aspirin 81 mg daily  To reduce risk of Colon Cancer 20 %,  Skin Cancer 26 % ,  Melanoma 46%  and  Pancreatic cancer 60% +++++++++++++++++++++++++ Vitamin D goal  is between 70-100.  Please make sure that you are taking your Vitamin D as directed.  It is very important as a natural anti-inflammatory  helping hair, skin, and nails, as well as reducing stroke and heart attack risk.  It helps your bones and helps with mood. It also decreases numerous cancer risks so please take it as directed.  Low Vit D is associated with a 200-300% higher risk for CANCER  and 200-300% higher risk for HEART   ATTACK  &  STROKE.   ...................................... It is also associated with higher death rate at younger ages,  autoimmune diseases like Rheumatoid arthritis, Lupus, Multiple Sclerosis.    Also many other serious conditions, like depression, Alzheimer's Dementia, infertility, muscle aches, fatigue, fibromyalgia - just to name a few. ++++++++++++++++++++ Recommend the book "The END of DIETING" by Dr Ankith Fuhrman  & the book "The END of DIABETES " by Dr Hymen Fuhrman At [employer] - get book & Audio CD's    Being diabetic has a  300% increased risk for heart attack, stroke, cancer, and alzheimer- type vascular dementia. It is very important that you work harder with diet by avoiding all foods that are white. Avoid white rice (brown & wild rice is OK), white potatoes (sweetpotatoes in moderation is OK), White bread or wheat bread or anything made out of white flour like bagels, donuts, rolls, buns, biscuits, cakes, pastries, cookies, pizza crust, and pasta (made from white flour & egg whites) - vegetarian pasta or spinach or wheat pasta is OK. Multigrain breads like Arnold's or Pepperidge Farm, or multigrain sandwich thins or flatbreads.  Diet, exercise and weight loss can reverse and cure diabetes in the early stages.  Diet, exercise and weight loss is  very important in the control and prevention of complications of diabetes which affects every system in your body, ie. Brain - dementia/stroke, eyes - glaucoma/blindness, heart - heart attack/heart failure, kidneys - dialysis, stomach - gastric paralysis, intestines - malabsorption, nerves - severe painful neuritis, circulation - gangrene & loss of a leg(s), and finally cancer and Alzheimers.    I recommend avoid fried & greasy foods,  sweets/candy, white rice (brown or wild rice or Quinoa is OK), white potatoes (sweet potatoes are OK) - anything made from white flour - bagels, doughnuts, rolls, buns, biscuits,white and wheat breads, pizza crust and traditional pasta made of white flour & egg white(vegetarian pasta or spinach or wheat pasta is OK).  Multi-grain bread is OK - like multi-grain flat bread or sandwich thins. Avoid alcohol in excess. Exercise is also important.    Eat all the vegetables you want - avoid meat, especially red meat and dairy - especially cheese.  Cheese is the most concentrated form of trans-fats which is the worst thing to clog up our arteries. Veggie cheese is OK which can be found in the fresh produce section at [employer] or [employer] or [employer]  +++++++++++++++++++++ DASH Eating Plan  DASH stands for "Dietary Approaches to Stop Hypertension."   The DASH eating plan is a healthy eating plan that has been shown to reduce high blood pressure (hypertension). Additional health benefits may include reducing the risk of type 2 diabetes mellitus, heart disease, and stroke. The DASH eating plan may also   help with weight loss. WHAT DO I NEED TO KNOW ABOUT THE DASH EATING PLAN? For the DASH eating plan, you will follow these general guidelines:  Choose foods with a percent daily value for sodium of less than 5% (as listed on the food label).  Use salt-free seasonings or herbs instead of table salt or sea salt.  Check with your health care provider or pharmacist before  using salt substitutes.  Eat lower-sodium products, often labeled as "lower sodium" or "no salt added."  Eat fresh foods.  Eat more vegetables, fruits, and low-fat dairy products.  Choose whole grains. Look for the word "whole" as the first word in the ingredient list.  Choose fish   Limit sweets, desserts, sugars, and sugary drinks.  Choose heart-healthy fats.  Eat veggie cheese   Eat more home-cooked food and less restaurant, buffet, and fast food.  Limit fried foods.  Cook foods using methods other than frying.  Limit canned vegetables. If you do use them, rinse them well to decrease the sodium.  When eating at a restaurant, ask that your food be prepared with less salt, or no salt if possible.                      WHAT FOODS CAN I EAT? Read Dr Leontae Fuhrman's books on The End of Dieting & The End of Diabetes  Grains Whole grain or whole wheat bread. Brown rice. Whole grain or whole wheat pasta. Quinoa, bulgur, and whole grain cereals. Low-sodium cereals. Corn or whole wheat flour tortillas. Whole grain cornbread. Whole grain crackers. Low-sodium crackers.  Vegetables Fresh or frozen vegetables (raw, steamed, roasted, or grilled). Low-sodium or reduced-sodium tomato and vegetable juices. Low-sodium or reduced-sodium tomato sauce and paste. Low-sodium or reduced-sodium canned vegetables.   Fruits All fresh, canned (in natural juice), or frozen fruits.  Protein Products  All fish and seafood.  Dried beans, peas, or lentils. Unsalted nuts and seeds. Unsalted canned beans.  Dairy Low-fat dairy products, such as skim or 1% milk, 2% or reduced-fat cheeses, low-fat ricotta or cottage cheese, or plain low-fat yogurt. Low-sodium or reduced-sodium cheeses.  Fats and Oils Tub margarines without trans fats. Light or reduced-fat mayonnaise and salad dressings (reduced sodium). Avocado. Safflower, olive, or canola oils. Natural peanut or almond butter.  Other Unsalted popcorn  and pretzels. The items listed above may not be a complete list of recommended foods or beverages. Contact your dietitian for more options.  +++++++++++++++  WHAT FOODS ARE NOT RECOMMENDED? Grains/ White flour or wheat flour White bread. White pasta. White rice. Refined cornbread. Bagels and croissants. Crackers that contain trans fat.  Vegetables  Creamed or fried vegetables. Vegetables in a . Regular canned vegetables. Regular canned tomato sauce and paste. Regular tomato and vegetable juices.  Fruits Dried fruits. Canned fruit in light or heavy syrup. Fruit juice.  Meat and Other Protein Products Meat in general - RED meat & White meat.  Fatty cuts of meat. Ribs, chicken wings, all processed meats as bacon, sausage, bologna, salami, fatback, hot dogs, bratwurst and packaged luncheon meats.  Dairy Whole or 2% milk, cream, half-and-half, and cream cheese. Whole-fat or sweetened yogurt. Full-fat cheeses or blue cheese. Non-dairy creamers and whipped toppings. Processed cheese, cheese spreads, or cheese curds.  Condiments Onion and garlic salt, seasoned salt, table salt, and sea salt. Canned and packaged gravies. Worcestershire sauce. Tartar sauce. Barbecue sauce. Teriyaki sauce. Soy sauce, including reduced sodium. Steak sauce. Fish sauce. Oyster sauce. Cocktail   sauce. Horseradish. Ketchup and mustard. Meat flavorings and tenderizers. Bouillon cubes. Hot sauce. Tabasco sauce. Marinades. Taco seasonings. Relishes.  Fats and Oils Butter, stick margarine, lard, shortening and bacon fat. Coconut, palm kernel, or palm oils. Regular salad dressings.  Pickles and olives. Salted popcorn and pretzels.  The items listed above may not be a complete list of foods and beverages to avoid.   
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
## 2017-12-29 ENCOUNTER — Other Ambulatory Visit: Payer: Self-pay | Admitting: Internal Medicine

## 2017-12-29 LAB — TESTOSTERONE: TESTOSTERONE: 1796 ng/dL — AB (ref 250–827)

## 2017-12-29 LAB — COMPLETE METABOLIC PANEL WITH GFR
AG Ratio: 2 (calc) (ref 1.0–2.5)
ALBUMIN MSPROF: 4.7 g/dL (ref 3.6–5.1)
ALKALINE PHOSPHATASE (APISO): 55 U/L (ref 40–115)
ALT: 26 U/L (ref 9–46)
AST: 25 U/L (ref 10–35)
BILIRUBIN TOTAL: 0.7 mg/dL (ref 0.2–1.2)
BUN: 21 mg/dL (ref 7–25)
CHLORIDE: 101 mmol/L (ref 98–110)
CO2: 29 mmol/L (ref 20–32)
Calcium: 10.5 mg/dL — ABNORMAL HIGH (ref 8.6–10.3)
Creat: 0.93 mg/dL (ref 0.70–1.33)
GFR, Est African American: 104 mL/min/{1.73_m2} (ref >=60)
GFR, Est Non African American: 90 mL/min/{1.73_m2} (ref >=60)
GLOBULIN: 2.4 g/dL (ref 1.9–3.7)
Glucose, Bld: 88 mg/dL (ref 65–99)
Potassium: 3.8 mmol/L (ref 3.5–5.3)
SODIUM: 139 mmol/L (ref 135–146)
Total Protein: 7.1 g/dL (ref 6.1–8.1)

## 2017-12-29 LAB — CBC WITH DIFFERENTIAL/PLATELET
BASOS ABS: 70 {cells}/uL (ref 0–200)
Basophils Relative: 1.1 %
EOS ABS: 109 {cells}/uL (ref 15–500)
Eosinophils Relative: 1.7 %
HCT: 48.4 % (ref 38.5–50.0)
Hemoglobin: 16.9 g/dL (ref 13.2–17.1)
Lymphs Abs: 1101 cells/uL (ref 850–3900)
MCH: 32.3 pg (ref 27.0–33.0)
MCHC: 34.9 g/dL (ref 32.0–36.0)
MCV: 92.5 fL (ref 80.0–100.0)
MONOS PCT: 11.3 %
MPV: 11.1 fL (ref 7.5–12.5)
Neutro Abs: 4397 cells/uL (ref 1500–7800)
Neutrophils Relative %: 68.7 %
PLATELETS: 214 10*3/uL (ref 140–400)
RBC: 5.23 10*6/uL (ref 4.20–5.80)
RDW: 12.2 % (ref 11.0–15.0)
TOTAL LYMPHOCYTE: 17.2 %
WBC mixed population: 723 cells/uL (ref 200–950)
WBC: 6.4 10*3/uL (ref 3.8–10.8)

## 2017-12-29 LAB — MAGNESIUM: MAGNESIUM: 2 mg/dL (ref 1.5–2.5)

## 2017-12-29 LAB — HEMOGLOBIN A1C
EAG (MMOL/L): 5.7 (calc)
Hgb A1c MFr Bld: 5.2 % of total Hgb (ref <5.7)
Mean Plasma Glucose: 103 (calc)

## 2017-12-29 LAB — INSULIN, RANDOM: INSULIN: 2.2 u[IU]/mL (ref 2.0–19.6)

## 2017-12-29 LAB — VITAMIN D 25 HYDROXY (VIT D DEFICIENCY, FRACTURES): VIT D 25 HYDROXY: 92 ng/mL (ref 30–100)

## 2017-12-29 LAB — LIPID PANEL
CHOLESTEROL: 218 mg/dL — AB (ref <200)
HDL: 56 mg/dL (ref >40)
LDL Cholesterol (Calc): 128 mg/dL (calc) — ABNORMAL HIGH
NON-HDL CHOLESTEROL (CALC): 162 mg/dL — AB (ref <130)
TRIGLYCERIDES: 205 mg/dL — AB (ref <150)
Total CHOL/HDL Ratio: 3.9 (calc) (ref <5.0)

## 2017-12-29 LAB — TSH: TSH: 0.93 m[IU]/L (ref 0.40–4.50)

## 2017-12-29 MED ORDER — EZETIMIBE 10 MG PO TABS: ORAL_TABLET | ORAL | 3 refills | Status: DC

## 2018-01-04 ENCOUNTER — Other Ambulatory Visit: Payer: Self-pay | Admitting: Internal Medicine

## 2018-01-04 DIAGNOSIS — R0989 Other specified symptoms and signs involving the circulatory and respiratory systems: Secondary | ICD-10-CM

## 2018-01-13 ENCOUNTER — Telehealth: Payer: Self-pay | Admitting: Pulmonary Disease

## 2018-01-13 DIAGNOSIS — G4733 Obstructive sleep apnea (adult) (pediatric): Secondary | ICD-10-CM

## 2018-01-13 DIAGNOSIS — Z9989 Dependence on other enabling machines and devices: Secondary | ICD-10-CM

## 2018-01-13 NOTE — Telephone Encounter (Signed)
Spoke with pt, he is requesting a new CPAP machine since he is qualified for it now. Since it has been over 5 years. RA can we send order to [facility]? Please advise.   Assessment & Plan Note by Rigoberto Noel, MD at 07/07/2017 4:50 PM  Author: Rigoberto Noel, MD Author Type: Physician Filed: 07/07/2017 4:50 PM  Note Status: Written Cosign: Cosign Not Required Encounter Date: 07/07/2017  Problem: OSA on CPAP  Editor: Rigoberto Noel, MD (Physician)    CPAP 10 cm seems to be working well. We discussed newer types of masks  Weight loss encouraged, compliance with goal of at least 4-6 hrs every night is the expectation. Advised against medications with sedative side effects Cautioned against driving when sleepy - understanding that sleepiness will vary on a day to day basis     Patient Instructions by Rigoberto Noel, MD at 07/07/2017 4:15 PM  Author: Rigoberto Noel, MD Author Type: Physician Filed: 07/07/2017 4:46 PM  Note Status: Signed Cosign: Cosign Not Required Encounter Date: 07/07/2017  Editor: Rigoberto Noel, MD (Physician)    CPAP 10 cm is working well

## 2018-01-13 NOTE — Telephone Encounter (Signed)
CPAP 10 cm new prescription

## 2018-01-13 NOTE — Telephone Encounter (Signed)
Order placed, Pt advised  And nothing further is needed.

## 2018-01-20 ENCOUNTER — Other Ambulatory Visit: Payer: Self-pay | Admitting: Internal Medicine

## 2018-01-20 ENCOUNTER — Telehealth: Payer: Self-pay | Admitting: Pulmonary Disease

## 2018-01-20 DIAGNOSIS — F411 Generalized anxiety disorder: Secondary | ICD-10-CM

## 2018-01-20 NOTE — Telephone Encounter (Signed)
Called and spoke to [employer] [facility]ner at [facility], they have received order it is in review. Called and made patient aware. Voiced understanding. Nothing further is needed at this time.

## 2018-02-17 ENCOUNTER — Telehealth: Payer: Self-pay | Admitting: Pulmonary Disease

## 2018-02-17 NOTE — Telephone Encounter (Signed)
Spoke with patient. He stated that [facility] has yet to give him a new CPAP machine. The order was placed on 11/14 and he stated that they received the order on 11/15. Advised him that I would call [facility] to check on status of order.   Spoke with Lattie Haw at [facility]. She stated that she was familiar with the patient and his wife. At the time of call, the machine was being processed through [facility] and would be sent this week.   Spoke with patient again. He verbalized understanding and was very Patent attorney.   Nothing further needed at time of call.

## 2018-02-18 DIAGNOSIS — G4733 Obstructive sleep apnea (adult) (pediatric): Secondary | ICD-10-CM | POA: Diagnosis not present

## 2018-03-09 ENCOUNTER — Encounter: Payer: Self-pay | Admitting: Internal Medicine

## 2018-03-11 DIAGNOSIS — L821 Other seborrheic keratosis: Secondary | ICD-10-CM | POA: Diagnosis not present

## 2018-03-22 DIAGNOSIS — G4733 Obstructive sleep apnea (adult) (pediatric): Secondary | ICD-10-CM | POA: Diagnosis not present

## 2018-04-04 ENCOUNTER — Other Ambulatory Visit: Payer: Self-pay | Admitting: Internal Medicine

## 2018-04-04 DIAGNOSIS — R0989 Other specified symptoms and signs involving the circulatory and respiratory systems: Secondary | ICD-10-CM

## 2018-04-06 ENCOUNTER — Ambulatory Visit (INDEPENDENT_AMBULATORY_CARE_PROVIDER_SITE_OTHER): Payer: BLUE CROSS/BLUE SHIELD | Admitting: Internal Medicine

## 2018-04-06 ENCOUNTER — Encounter: Payer: Self-pay | Admitting: Internal Medicine

## 2018-04-06 VITALS — BP 128/74 | HR 66 | Temp 97.9°F | Ht 65.0 in | Wt 188.8 lb

## 2018-04-06 DIAGNOSIS — Z87891 Personal history of nicotine dependence: Secondary | ICD-10-CM

## 2018-04-06 DIAGNOSIS — I700 Atherosclerosis of aorta: Secondary | ICD-10-CM

## 2018-04-06 DIAGNOSIS — E349 Endocrine disorder, unspecified: Secondary | ICD-10-CM

## 2018-04-06 DIAGNOSIS — Z130 Encounter for screening for diseases of the blood and blood-forming organs and certain disorders involving the immune mechanism: Secondary | ICD-10-CM

## 2018-04-06 DIAGNOSIS — Z0001 Encounter for general adult medical examination with abnormal findings: Secondary | ICD-10-CM

## 2018-04-06 DIAGNOSIS — Z111 Encounter for screening for respiratory tuberculosis: Secondary | ICD-10-CM

## 2018-04-06 DIAGNOSIS — R7303 Prediabetes: Secondary | ICD-10-CM

## 2018-04-06 DIAGNOSIS — Z1329 Encounter for screening for other suspected endocrine disorder: Secondary | ICD-10-CM | POA: Diagnosis not present

## 2018-04-06 DIAGNOSIS — E66811 Obesity, class 1: Secondary | ICD-10-CM

## 2018-04-06 DIAGNOSIS — E782 Mixed hyperlipidemia: Secondary | ICD-10-CM

## 2018-04-06 DIAGNOSIS — Z0000 Encounter for general adult medical examination without abnormal findings: Secondary | ICD-10-CM

## 2018-04-06 DIAGNOSIS — Z1212 Encounter for screening for malignant neoplasm of rectum: Secondary | ICD-10-CM

## 2018-04-06 DIAGNOSIS — Z9989 Dependence on other enabling machines and devices: Secondary | ICD-10-CM

## 2018-04-06 DIAGNOSIS — Z13220 Encounter for screening for lipoid disorders: Secondary | ICD-10-CM

## 2018-04-06 DIAGNOSIS — Z125 Encounter for screening for malignant neoplasm of prostate: Secondary | ICD-10-CM

## 2018-04-06 DIAGNOSIS — G4733 Obstructive sleep apnea (adult) (pediatric): Secondary | ICD-10-CM

## 2018-04-06 DIAGNOSIS — Z131 Encounter for screening for diabetes mellitus: Secondary | ICD-10-CM | POA: Diagnosis not present

## 2018-04-06 DIAGNOSIS — Z23 Encounter for immunization: Secondary | ICD-10-CM

## 2018-04-06 DIAGNOSIS — Z8249 Family history of ischemic heart disease and other diseases of the circulatory system: Secondary | ICD-10-CM

## 2018-04-06 DIAGNOSIS — I10 Essential (primary) hypertension: Secondary | ICD-10-CM | POA: Diagnosis not present

## 2018-04-06 DIAGNOSIS — N401 Enlarged prostate with lower urinary tract symptoms: Secondary | ICD-10-CM

## 2018-04-06 DIAGNOSIS — Z1389 Encounter for screening for other disorder: Secondary | ICD-10-CM | POA: Diagnosis not present

## 2018-04-06 DIAGNOSIS — Z79899 Other long term (current) drug therapy: Secondary | ICD-10-CM

## 2018-04-06 DIAGNOSIS — R350 Frequency of micturition: Secondary | ICD-10-CM

## 2018-04-06 DIAGNOSIS — Z136 Encounter for screening for cardiovascular disorders: Secondary | ICD-10-CM

## 2018-04-06 DIAGNOSIS — Z6833 Body mass index (BMI) 33.0-33.9, adult: Secondary | ICD-10-CM

## 2018-04-06 DIAGNOSIS — E559 Vitamin D deficiency, unspecified: Secondary | ICD-10-CM

## 2018-04-06 DIAGNOSIS — E6609 Other obesity due to excess calories: Secondary | ICD-10-CM

## 2018-04-06 DIAGNOSIS — Z1211 Encounter for screening for malignant neoplasm of colon: Secondary | ICD-10-CM

## 2018-04-06 DIAGNOSIS — R5383 Other fatigue: Secondary | ICD-10-CM
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
## 2018-04-06 NOTE — Patient Instructions (Signed)
We Do NOT Approve of  [facility] [place] Soliciting Our Patients  To Do Home Visits  & We Do NOT Approve of [facility] > > > > > > > > > > > > > > > > > > > > > > > > > > > > > > > > > > >  > > > >   Preventive Care for Adults  A healthy lifestyle and preventive care can promote health and wellness. Preventive health guidelines for men include the following key practices:  A routine yearly physical is a good way to check with your health care provider about your health and preventative screening. It is a chance to share any concerns and updates on your health and to receive a thorough exam.  Visit your dentist for a routine exam and preventative care every 6 months. Brush your teeth twice a day and floss once a day. Good oral hygiene prevents tooth decay and gum disease.  The frequency of eye exams is based on your age, health, family medical history, use of contact lenses, and other factors. Follow your health care provider's recommendations for frequency of eye exams.  Eat a healthy diet. Foods such as vegetables, fruits, whole grains, low-fat dairy products, and lean protein foods contain the nutrients you need without too many calories. Decrease your intake of foods high in solid fats, added sugars, and salt. Eat the right amount of calories for you. Get information about a proper diet from your health care provider, if necessary.  Regular physical exercise is one of the most important things you can do for your health. Most adults should get at least 150 minutes of moderate-intensity exercise (any activity that increases your heart rate and causes you to sweat) each week. In addition, most adults need muscle-strengthening exercises on 2 or more days a week.  Maintain a healthy weight. The body mass index (BMI) is a screening tool to identify possible weight problems. It provides an estimate of body fat based on height and weight. Your health care provider can find  your BMI and can help you achieve or maintain a healthy weight. For adults 20 years and older:  A BMI below 18.5 is considered underweight.  A BMI of 18.5 to 24.9 is normal.  A BMI of 25 to 29.9 is considered overweight.  A BMI of 30 and above is considered obese.  Maintain normal blood lipids and cholesterol levels by exercising and minimizing your intake of saturated fat. Eat a balanced diet with plenty of fruit and vegetables. Blood tests for lipids and cholesterol should begin at age 20 and be repeated every 5 years. If your lipid or cholesterol levels are high, you are over 50, or you are at high risk for heart disease, you may need your cholesterol levels checked more frequently. Ongoing high lipid and cholesterol levels should be treated with medicines if diet and exercise are not working.  If you smoke, find out from your health care provider how to quit. If you do not use tobacco, do not start.  Lung cancer screening is recommended for adults aged 55-80 years who are at high risk for developing lung cancer because of a history of smoking. A yearly low-dose CT scan of the lungs is recommended for people who have at least a 30-pack-year history of smoking and are a current smoker or have quit within the past 15 years. A pack year of smoking is smoking an average of 1 pack   of cigarettes a day for 1 year (for example: 1 pack a day for 30 years or 2 packs a day for 15 years). Yearly screening should continue until the smoker has stopped smoking for at least 15 years. Yearly screening should be stopped for people who develop a health problem that would prevent them from having lung cancer treatment.  If you choose to drink alcohol, do not have more than 2 drinks per day. One drink is considered to be 12 ounces (355 mL) of beer, 5 ounces (148 mL) of wine, or 1.5 ounces (44 mL) of liquor.  Avoid use of street drugs. Do not share needles with anyone. Ask for help if you need support or instructions  about stopping the use of drugs.  High blood pressure causes heart disease and increases the risk of stroke. Your blood pressure should be checked at least every 1-2 years. Ongoing high blood pressure should be treated with medicines, if weight loss and exercise are not effective.  If you are 45-79 years old, ask your health care provider if you should take aspirin to prevent heart disease.  Diabetes screening involves taking a blood sample to check your fasting blood sugar level. Testing should be considered at a younger age or be carried out more frequently if you are overweight and have at least 1 risk factor for diabetes.  Colorectal cancer can be detected and often prevented. Most routine colorectal cancer screening begins at the age of 50 and continues through age 75. However, your health care provider may recommend screening at an earlier age if you have risk factors for colon cancer. On a yearly basis, your health care provider may provide home test kits to check for hidden blood in the stool. Use of a small camera at the end of a tube to directly examine the colon (sigmoidoscopy or colonoscopy) can detect the earliest forms of colorectal cancer. Talk to your health care provider about this at age 50, when routine screening begins. Direct exam of the colon should be repeated every 5-10 years through age 75, unless early forms of precancerous polyps or small growths are found.  Hepatitis C blood testing is recommended for all people born from 1945 through 1965 and any individual with known risks for hepatitis C.  Screening for abdominal aortic aneurysm (AAA)  by ultrasound is recommended for people who have history of high blood pressure or who are current or former smokers.  Healthy men should  receive prostate-specific antigen (PSA) blood tests as part of routine cancer screening. Talk with your health care provider about prostate cancer screening.  Testicular cancer screening is   recommended for adult males. Screening includes self-exam, a health care provider exam, and other screening tests. Consult with your health care provider about any symptoms you have or any concerns you have about testicular cancer.  Use sunscreen. Apply sunscreen liberally and repeatedly throughout the day. You should seek shade when your shadow is shorter than you. Protect yourself by wearing long sleeves, pants, a wide-brimmed hat, and sunglasses year round, whenever you are outdoors.  Once a month, do a whole-body skin exam, using a mirror to look at the skin on your back. Tell your health care provider about new moles, moles that have irregular borders, moles that are larger than a pencil eraser, or moles that have changed in shape or color.  Stay current with required vaccines (immunizations).  Influenza vaccine. All adults should be immunized every year.  Tetanus, diphtheria, and acellular pertussis (  Td, Tdap) vaccine. An adult who has not previously received Tdap or who does not know his vaccine status should receive 1 dose of Tdap. This initial dose should be followed by tetanus and diphtheria toxoids (Td) booster doses every 10 years. Adults with an unknown or incomplete history of completing a 3-dose immunization series with Td-containing vaccines should begin or complete a primary immunization series including a Tdap dose. Adults should receive a Td booster every 10 years.  Zoster vaccine. One dose is recommended for adults aged 60 years or older unless certain conditions are present.    PREVNAR - Pneumococcal 13-valent conjugate (PCV13) vaccine. When indicated, a person who is uncertain of his immunization history and has no record of immunization should receive the PCV13 vaccine. An adult aged 19 years or older who has certain medical conditions and has not been previously immunized should receive 1 dose of PCV13 vaccine. This PCV13 should be followed with a dose of pneumococcal  polysaccharide (PPSV23) vaccine. The PPSV23 vaccine dose should be obtained 1 or more year(s)after the dose of PCV13 vaccine. An adult aged 19 years or older who has certain medical conditions and previously received 1 or more doses of PPSV23 vaccine should receive 1 dose of PCV13. The PCV13 vaccine dose should be obtained 1 or more years after the last PPSV23 vaccine dose.    PNEUMOVAX - Pneumococcal polysaccharide (PPSV23) vaccine. When PCV13 is also indicated, PCV13 should be obtained first. All adults aged 65 years and older should be immunized. An adult younger than age 65 years who has certain medical conditions should be immunized. Any person who resides in a nursing home or long-term care facility should be immunized. An adult smoker should be immunized. People with an immunocompromised condition and certain other conditions should receive both PCV13 and PPSV23 vaccines. People with human immunodeficiency virus (HIV) infection should be immunized as soon as possible after diagnosis. Immunization during chemotherapy or radiation therapy should be avoided. Routine use of PPSV23 vaccine is not recommended for American Indians, [place] Natives, or people younger than 65 years unless there are medical conditions that require PPSV23 vaccine. When indicated, people who have unknown immunization and have no record of immunization should receive PPSV23 vaccine. One-time revaccination 5 years after the first dose of PPSV23 is recommended for people aged 19-64 years who have chronic kidney failure, nephrotic syndrome, asplenia, or immunocompromised conditions. People who received 1-2 doses of PPSV23 before age 65 years should receive another dose of PPSV23 vaccine at age 65 years or later if at least 5 years have passed since the previous dose. Doses of PPSV23 are not needed for people immunized with PPSV23 at or after age 65 years.    Hepatitis A vaccine. Adults who wish to be protected from this disease, have  certain high-risk conditions, work with hepatitis A-infected animals, work in hepatitis A research labs, or travel to or work in countries with a high rate of hepatitis A should be immunized. Adults who were previously unvaccinated and who anticipate close contact with an international adoptee during the first 60 days after arrival in the [place] from a country with a high rate of hepatitis A should be immunized.    Hepatitis B vaccine. Adults should be immunized if they wish to be protected from this disease, have certain high-risk conditions, may be exposed to blood or other infectious body fluids, are household contacts or sex partners of hepatitis B positive people, are clients or workers in certain care facilities, or   travel to or work in countries with a high rate of hepatitis B.   Preventive Service / Frequency   Ages 65 and over  Blood pressure check.  Lipid and cholesterol check.  Lung cancer screening. / Every year if you are aged 55-80 years and have a 30-pack-year history of smoking and currently smoke or have quit within the past 15 years. Yearly screening is stopped once you have quit smoking for at least 15 years or develop a health problem that would prevent you from having lung cancer treatment.  Fecal occult blood test (FOBT) of stool. You may not have to do this test if you get a colonoscopy every 10 years.  Flexible sigmoidoscopy** or colonoscopy.** / Every 5 years for a flexible sigmoidoscopy or every 10 years for a colonoscopy beginning at age 50 and continuing until age 75.  Hepatitis C blood test.** / For all people born from 1945 through 1965 and any individual with known risks for hepatitis C.  Abdominal aortic aneurysm (AAA) screening./ Screening current or former smokers or have Hypertension.  Skin self-exam. / Monthly.  Influenza vaccine. / Every year.  Tetanus, diphtheria, and acellular pertussis (Tdap/Td) vaccine.** / 1 dose of Td every 10  years.   Zoster vaccine.** / 1 dose for adults aged 60 years or older.         Pneumococcal 13-valent conjugate (PCV13) vaccine.    Pneumococcal polysaccharide (PPSV23) vaccine.     Hepatitis A vaccine.** / Consult your health care provider.  Hepatitis B vaccine.** / Consult your health care provider. Screening for abdominal aortic aneurysm (AAA)  by ultrasound is recommended for people who have history of high blood pressure or who are current or former smokers. ++++++++++ Recommend Adult Low Dose Aspirin or  coated  Aspirin 81 mg daily  To reduce risk of Colon Cancer 20 %,  Skin Cancer 26 % ,  Malignant Melanoma 46%  and  Pancreatic cancer 60% ++++++++++++++++++++++ Vitamin D goal  is between 70-100.  Please make sure that you are taking your Vitamin D as directed.  It is very important as a natural anti-inflammatory  helping hair, skin, and nails, as well as reducing stroke and heart attack risk.  It helps your bones and helps with mood. It also decreases numerous cancer risks so please take it as directed.  Low Vit D is associated with a 200-300% higher risk for CANCER  and 200-300% higher risk for HEART   ATTACK  &  STROKE.   ...................................... It is also associated with higher death rate at younger ages,  autoimmune diseases like Rheumatoid arthritis, Lupus, Multiple Sclerosis.    Also many other serious conditions, like depression, Alzheimer's Dementia, infertility, muscle aches, fatigue, fibromyalgia - just to name a few. ++++++++++++++++++++++ Recommend the book "The END of DIETING" by Dr Truxton Fuhrman  & the book "The END of DIABETES " by Dr Samier Fuhrman At [employer] - get book & Audio CD's    Being diabetic has a  300% increased risk for heart attack, stroke, cancer, and alzheimer- type vascular dementia. It is very important that you work harder with diet by avoiding all foods that are white. Avoid white rice (brown & wild rice is OK), white  potatoes (sweetpotatoes in moderation is OK), White bread or wheat bread or anything made out of white flour like bagels, donuts, rolls, buns, biscuits, cakes, pastries, cookies, pizza crust, and pasta (made from white flour & egg whites) - vegetarian pasta or spinach or wheat   pasta is OK. Multigrain breads like Arnold's or [employer], or multigrain sandwich thins or flatbreads.  Diet, exercise and weight loss can reverse and cure diabetes in the early stages.  Diet, exercise and weight loss is very important in the control and prevention of complications of diabetes which affects every system in your body, ie. Brain - dementia/stroke, eyes - glaucoma/blindness, heart - heart attack/heart failure, kidneys - dialysis, stomach - gastric paralysis, intestines - malabsorption, nerves - severe painful neuritis, circulation - gangrene & loss of a leg(s), and finally cancer and Alzheimers.    I recommend avoid fried & greasy foods,  sweets/candy, white rice (brown or wild rice or Quinoa is OK), white potatoes (sweet potatoes are OK) - anything made from white flour - bagels, doughnuts, rolls, buns, biscuits,white and wheat breads, pizza crust and traditional pasta made of white flour & egg white(vegetarian pasta or spinach or wheat pasta is OK).  Multi-grain bread is OK - like multi-grain flat bread or sandwich thins. Avoid alcohol in excess. Exercise is also important.    Eat all the vegetables you want - avoid meat, especially red meat and dairy - especially cheese.  Cheese is the most concentrated form of trans-fats which is the worst thing to clog up our arteries. Veggie cheese is OK which can be found in the fresh produce section at [employer] or [employer] or [employer]  ++++++++++++++++++++++ DASH Eating Plan  DASH stands for "Dietary Approaches to Stop Hypertension."   The DASH eating plan is a healthy eating plan that has been shown to reduce high blood pressure (hypertension). Additional health  benefits may include reducing the risk of type 2 diabetes mellitus, heart disease, and stroke. The DASH eating plan may also help with weight loss. WHAT DO I NEED TO KNOW ABOUT THE DASH EATING PLAN? For the DASH eating plan, you will follow these general guidelines:  Choose foods with a percent daily value for sodium of less than 5% (as listed on the food label).  Use salt-free seasonings or herbs instead of table salt or sea salt.  Check with your health care provider or pharmacist before using salt substitutes.  Eat lower-sodium products, often labeled as "lower sodium" or "no salt added."  Eat fresh foods.  Eat more vegetables, fruits, and low-fat dairy products.  Choose whole grains. Look for the word "whole" as the first word in the ingredient list.  Choose fish   Limit sweets, desserts, sugars, and sugary drinks.  Choose heart-healthy fats.  Eat veggie cheese   Eat more home-cooked food and less restaurant, buffet, and fast food.  Limit fried foods.  Cook foods using methods other than frying.  Limit canned vegetables. If you do use them, rinse them well to decrease the sodium.  When eating at a restaurant, ask that your food be prepared with less salt, or no salt if possible.                      WHAT FOODS CAN I EAT? Read Dr Manford Fuhrman's books on The End of Dieting & The End of Diabetes  Grains Whole grain or whole wheat bread. Brown rice. Whole grain or whole wheat pasta. Quinoa, bulgur, and whole grain cereals. Low-sodium cereals. Corn or whole wheat flour tortillas. Whole grain cornbread. Whole grain crackers. Low-sodium crackers.  Vegetables Fresh or frozen vegetables (raw, steamed, roasted, or grilled). Low-sodium or reduced-sodium tomato and vegetable juices. Low-sodium or reduced-sodium tomato sauce and paste. Low-sodium or   reduced-sodium canned vegetables.   Fruits All fresh, canned (in natural juice), or frozen fruits.  Protein Products  All fish  and seafood.  Dried beans, peas, or lentils. Unsalted nuts and seeds. Unsalted canned beans.  Dairy Low-fat dairy products, such as skim or 1% milk, 2% or reduced-fat cheeses, low-fat ricotta or cottage cheese, or plain low-fat yogurt. Low-sodium or reduced-sodium cheeses.  Fats and Oils Tub margarines without trans fats. Light or reduced-fat mayonnaise and salad dressings (reduced sodium). Avocado. Safflower, olive, or canola oils. Natural peanut or almond butter.  Other Unsalted popcorn and pretzels. The items listed above may not be a complete list of recommended foods or beverages. Contact your dietitian for more options.  ++++++++++++++++++++  WHAT FOODS ARE NOT RECOMMENDED? Grains/ White flour or wheat flour White bread. White pasta. White rice. Refined cornbread. Bagels and croissants. Crackers that contain trans fat.  Vegetables  Creamed or fried vegetables. Vegetables in a . Regular canned vegetables. Regular canned tomato sauce and paste. Regular tomato and vegetable juices.  Fruits Dried fruits. Canned fruit in light or heavy syrup. Fruit juice.  Meat and Other Protein Products Meat in general - RED meat & White meat.  Fatty cuts of meat. Ribs, chicken wings, all processed meats as bacon, sausage, bologna, salami, fatback, hot dogs, bratwurst and packaged luncheon meats.  Dairy Whole or 2% milk, cream, half-and-half, and cream cheese. Whole-fat or sweetened yogurt. Full-fat cheeses or blue cheese. Non-dairy creamers and whipped toppings. Processed cheese, cheese spreads, or cheese curds.  Condiments Onion and garlic salt, seasoned salt, table salt, and sea salt. Canned and packaged gravies. Worcestershire sauce. Tartar sauce. Barbecue sauce. Teriyaki sauce. Soy sauce, including reduced sodium. Steak sauce. Fish sauce. Oyster sauce. Cocktail sauce. Horseradish. Ketchup and mustard. Meat flavorings and tenderizers. Bouillon cubes. Hot sauce. Tabasco sauce. Marinades. Taco  seasonings. Relishes.  Fats and Oils Butter, stick margarine, lard, shortening and bacon fat. Coconut, palm kernel, or palm oils. Regular salad dressings.  Pickles and olives. Salted popcorn and pretzels.  The items listed above may not be a complete list of foods and beverages to avoid.    

## 2018-04-07 DIAGNOSIS — D485 Neoplasm of uncertain behavior of skin: Secondary | ICD-10-CM | POA: Diagnosis not present

## 2018-04-07 DIAGNOSIS — D0439 Carcinoma in situ of skin of other parts of face: Secondary | ICD-10-CM | POA: Diagnosis not present

## 2018-04-07 LAB — PSA: PSA: 1.9 ng/mL (ref <=4.0)

## 2018-04-07 LAB — CBC WITH DIFFERENTIAL/PLATELET
Absolute Monocytes: 726 cells/uL (ref 200–950)
Basophils Absolute: 79 cells/uL (ref 0–200)
Basophils Relative: 1.3 %
Eosinophils Absolute: 92 cells/uL (ref 15–500)
Eosinophils Relative: 1.5 %
HCT: 49 % (ref 38.5–50.0)
HEMOGLOBIN: 16.7 g/dL (ref 13.2–17.1)
Lymphs Abs: 1391 cells/uL (ref 850–3900)
MCH: 32.3 pg (ref 27.0–33.0)
MCHC: 34.1 g/dL (ref 32.0–36.0)
MCV: 94.8 fL (ref 80.0–100.0)
MPV: 11.3 fL (ref 7.5–12.5)
Monocytes Relative: 11.9 %
NEUTROS ABS: 3813 {cells}/uL (ref 1500–7800)
Neutrophils Relative %: 62.5 %
Platelets: 209 10*3/uL (ref 140–400)
RBC: 5.17 10*6/uL (ref 4.20–5.80)
RDW: 12 % (ref 11.0–15.0)
Total Lymphocyte: 22.8 %
WBC: 6.1 10*3/uL (ref 3.8–10.8)

## 2018-04-07 LAB — URINALYSIS, ROUTINE W REFLEX MICROSCOPIC
Bilirubin Urine: NEGATIVE
Glucose, UA: NEGATIVE
Hgb urine dipstick: NEGATIVE
Ketones, ur: NEGATIVE
Leukocytes, UA: NEGATIVE
Nitrite: NEGATIVE
PROTEIN: NEGATIVE
Specific Gravity, Urine: 1.019 (ref 1.001–1.03)
pH: <=5 (ref 5.0–8.0)

## 2018-04-07 LAB — COMPLETE METABOLIC PANEL WITH GFR
AG Ratio: 2 (calc) (ref 1.0–2.5)
ALT: 36 U/L (ref 9–46)
AST: 32 U/L (ref 10–35)
Albumin: 4.7 g/dL (ref 3.6–5.1)
Alkaline phosphatase (APISO): 46 U/L (ref 35–144)
BUN: 22 mg/dL (ref 7–25)
CO2: 26 mmol/L (ref 20–32)
Calcium: 10 mg/dL (ref 8.6–10.3)
Chloride: 102 mmol/L (ref 98–110)
Creat: 0.92 mg/dL (ref 0.70–1.25)
GFR, Est African American: 104 mL/min/{1.73_m2} (ref >=60)
GFR, Est Non African American: 90 mL/min/{1.73_m2} (ref >=60)
Globulin: 2.3 g/dL (calc) (ref 1.9–3.7)
Glucose, Bld: 88 mg/dL (ref 65–99)
Potassium: 4 mmol/L (ref 3.5–5.3)
Sodium: 137 mmol/L (ref 135–146)
Total Bilirubin: 0.6 mg/dL (ref 0.2–1.2)
Total Protein: 7 g/dL (ref 6.1–8.1)

## 2018-04-07 LAB — MICROALBUMIN / CREATININE URINE RATIO
Creatinine, Urine: 104 mg/dL (ref 20–320)
Microalb Creat Ratio: 34 mcg/mg creat — ABNORMAL HIGH (ref <30)
Microalb, Ur: 3.5 mg/dL

## 2018-04-07 LAB — VITAMIN B12: Vitamin B-12: 687 pg/mL (ref 200–1100)

## 2018-04-07 LAB — HEMOGLOBIN A1C
Hgb A1c MFr Bld: 5.2 % of total Hgb (ref <5.7)
Mean Plasma Glucose: 103 (calc)
eAG (mmol/L): 5.7 (calc)

## 2018-04-07 LAB — VITAMIN D 25 HYDROXY (VIT D DEFICIENCY, FRACTURES): Vit D, 25-Hydroxy: 87 ng/mL (ref 30–100)

## 2018-04-07 LAB — IRON, TOTAL/TOTAL IRON BINDING CAP
%SAT: 19 % (calc) — ABNORMAL LOW (ref 20–48)
Iron: 61 ug/dL (ref 50–180)
TIBC: 326 mcg/dL (calc) (ref 250–425)

## 2018-04-07 LAB — MAGNESIUM: Magnesium: 1.9 mg/dL (ref 1.5–2.5)

## 2018-04-07 LAB — TESTOSTERONE: Testosterone: 1098 ng/dL — ABNORMAL HIGH (ref 250–827)

## 2018-04-07 LAB — LIPID PANEL
Cholesterol: 112 mg/dL (ref <200)
HDL: 53 mg/dL (ref >=40)
LDL CHOLESTEROL (CALC): 43 mg/dL
Non-HDL Cholesterol (Calc): 59 mg/dL (calc) (ref <130)
Total CHOL/HDL Ratio: 2.1 (calc) (ref <5.0)
Triglycerides: 79 mg/dL (ref <150)

## 2018-04-07 LAB — INSULIN, RANDOM: Insulin: 4 u[IU]/mL (ref 2.0–19.6)

## 2018-04-07 LAB — TSH: TSH: 1.18 mIU/L (ref 0.40–4.50)

## 2018-04-21 ENCOUNTER — Other Ambulatory Visit: Payer: Self-pay | Admitting: *Deleted

## 2018-04-21 DIAGNOSIS — Z1212 Encounter for screening for malignant neoplasm of rectum: Principal | ICD-10-CM

## 2018-04-21 DIAGNOSIS — Z1211 Encounter for screening for malignant neoplasm of colon: Secondary | ICD-10-CM

## 2018-04-21 LAB — POC HEMOCCULT BLD/STL (HOME/3-CARD/SCREEN)
Card #2 Fecal Occult Blod, POC: NEGATIVE
Card #3 Fecal Occult Blood, POC: NEGATIVE
Fecal Occult Blood, POC: NEGATIVE

## 2018-04-22 ENCOUNTER — Other Ambulatory Visit: Payer: Self-pay | Admitting: Internal Medicine

## 2018-04-22 ENCOUNTER — Other Ambulatory Visit: Payer: Self-pay | Admitting: Cardiovascular Disease

## 2018-04-22 DIAGNOSIS — N529 Male erectile dysfunction, unspecified: Secondary | ICD-10-CM

## 2018-05-04 ENCOUNTER — Other Ambulatory Visit: Payer: Self-pay | Admitting: Internal Medicine

## 2018-05-05 DIAGNOSIS — D0439 Carcinoma in situ of skin of other parts of face: Secondary | ICD-10-CM | POA: Diagnosis not present

## 2018-05-17 IMAGING — NM NM MISC PROCEDURE
3 series · 18 of 18 positions shown · non-contrast
Comparison: none

[Series 1: stress-sum-em_(id)_sa · 6.4mm · 6.40mm/px · 6 of 64 frames shown]
[frame 6/64]
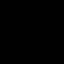
[frame 16/64]
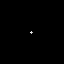
[frame 27/64]
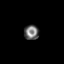
[frame 38/64]
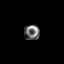
[frame 48/64]
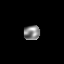
[frame 59/64]
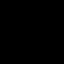

[Series 1: rest_(id)_sa · 6.4mm · 6.40mm/px · 6 of 64 frames shown]
[frame 6/64]
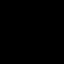
[frame 16/64]
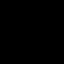
[frame 27/64]
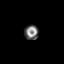
[frame 38/64]
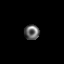
[frame 48/64]
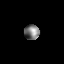
[frame 59/64]
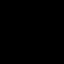

[Series 1: stress-gsp_(id)_sa · 6.4mm · 6.40mm/px · 6 of 512 frames shown]
[frame 43/512]
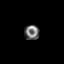
[frame 128/512]
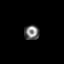
[frame 214/512]
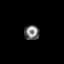
[frame 299/512]
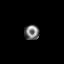
[frame 384/512]
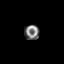
[frame 470/512]
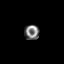

[18 of 18 positions shown; findings below may reference images not displayed]

Canned report from images found in remote index.

Refer to host system for actual result text.

## 2018-05-19 DIAGNOSIS — G4733 Obstructive sleep apnea (adult) (pediatric): Secondary | ICD-10-CM | POA: Diagnosis not present

## 2018-05-23 ENCOUNTER — Other Ambulatory Visit: Payer: Self-pay | Admitting: Cardiovascular Disease

## 2018-06-22 DIAGNOSIS — G4733 Obstructive sleep apnea (adult) (pediatric): Secondary | ICD-10-CM | POA: Diagnosis not present

## 2018-06-30 DIAGNOSIS — H2513 Age-related nuclear cataract, bilateral: Secondary | ICD-10-CM | POA: Diagnosis not present

## 2018-06-30 DIAGNOSIS — H35039 Hypertensive retinopathy, unspecified eye: Secondary | ICD-10-CM | POA: Diagnosis not present

## 2018-07-07 ENCOUNTER — Encounter: Payer: Self-pay | Admitting: Internal Medicine

## 2018-07-07 MED ORDER — TESTOSTERONE CYPIONATE 200 MG/ML IM SOLN: INTRAMUSCULAR | 3 refills | Status: DC
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
## 2018-07-07 NOTE — Patient Instructions (Addendum)
- Vit D  And Vit C 1,000 are recommended to help protect against the Covid_19 and other Corona viruses.   - Also it's recommended to take Zinc 50 mg to help protect against the Covid_19  And best place to get  is also on [employer].com and don't pay more than 6-8 cents /pill !   ==================================== Coronavirus (COVID-19) Are you at risk?  Are you at risk for the Coronavirus (COVID-19)?  To be considered HIGH RISK for Coronavirus (COVID-19), you have to meet the following criteria:  . Traveled to [place], [place], [place], [place] or [place]; or in the [place] to [place], [place], [place]  . or [place]; and have fever, cough, and shortness of breath within the last 2 weeks of travel OR . Been in close contact with a person diagnosed with COVID-19 within the last 2 weeks and have  . fever, cough,and shortness of breath .  . IF YOU DO NOT MEET THESE CRITERIA, YOU ARE CONSIDERED LOW RISK FOR COVID-19.  What to do if you are HIGH RISK for COVID-19?  Marland Kitchen If you are having a medical emergency, call 911. . Seek medical care right away. Before you go to a doctor's office, urgent care or emergency department, .  call ahead and tell them about your recent travel, contact with someone diagnosed with COVID-19  .  and your symptoms.  . You should receive instructions from your physician's office regarding next steps of care.  . When you arrive at healthcare provider, tell the healthcare staff immediately you have returned from  . visiting [place], [place], [place], [place] or [place]; or traveled in the [place] to [place], [place],  . [place] or [place] in the last two weeks or you have been in close contact with a person diagnosed with  . COVID-19 in the last 2 weeks.   . Tell the health care staff about your symptoms: fever, cough and shortness of breath. . After you have been seen by a medical provider, you will be either: o Tested for (COVID-19) and  discharged home on quarantine except to seek medical care if  o symptoms worsen, and asked to  - Stay home and avoid contact with others until you get your results (4-5 days)  - Avoid travel on public transportation if possible (such as bus, train, or airplane) or o Sent to the Emergency Department by EMS for evaluation, COVID-19 testing  and  o possible admission depending on your condition and test results.  What to do if you are LOW RISK for COVID-19?  Reduce your risk of any infection by using the same precautions used for avoiding the common cold or flu:  Marland Kitchen Wash your hands often with soap and warm water for at least 20 seconds.  If soap and water are not readily available,  . use an alcohol-based hand sanitizer with at least 60% alcohol.  . If coughing or sneezing, cover your mouth and nose by coughing or sneezing into the elbow areas of your shirt or coat, .  into a tissue or into your sleeve (not your hands). . Avoid shaking hands with others and consider head nods or verbal greetings only. . Avoid touching your eyes, nose, or mouth with unwashed hands.  . Avoid close contact with people who are sick. . Avoid places or events with large numbers of people in one location, like concerts or sporting events. . Carefully consider travel plans you have or are  making. . If you are planning any travel outside or inside the [place], visit the CDC's Travelers' Health webpage for the latest health notices. . If you have some symptoms but not all symptoms, continue to monitor at home and seek medical attention  . if your symptoms worsen. . If you are having a medical emergency, call 911.   ++++++++++++++++++++++++++++++++ Recommend Adult Low Dose Aspirin or  coated  Aspirin 81 mg daily  To reduce risk of Colon Cancer 20 %,  Skin Cancer 26 % ,  Melanoma 46%  and  Pancreatic cancer 60% ++++++++++++++++++++++++++++++++ Vitamin D goal  is between 70-100.  Please make sure that you are taking  your Vitamin D as directed.  It is very important as a natural anti-inflammatory  helping hair, skin, and nails, as well as reducing stroke and heart attack risk.  It helps your bones and helps with mood. It also decreases numerous cancer risks so please take it as directed.  Low Vit D is associated with a 200-300% higher risk for CANCER  and 200-300% higher risk for HEART   ATTACK  &  STROKE.   .....................................Marland Kitchen It is also associated with higher death rate at younger ages,  autoimmune diseases like Rheumatoid arthritis, Lupus, Multiple Sclerosis.    Also many other serious conditions, like depression, Alzheimer's Dementia, infertility, muscle aches, fatigue, fibromyalgia - just to name a few. ++++++++++++++++++++ Recommend the book "The END of DIETING" by Dr Excell Seltzer  & the book "The END of DIABETES " by Dr Excell Seltzer At [email or web address] - get book & Audio CD's    Being diabetic has a  300% increased risk for heart attack, stroke, cancer, and alzheimer- type vascular dementia. It is very important that you work harder with diet by avoiding all foods that are white. Avoid white rice (brown & wild rice is OK), white potatoes (sweetpotatoes in moderation is OK), White bread or wheat bread or anything made out of white flour like bagels, donuts, rolls, buns, biscuits, cakes, pastries, cookies, pizza crust, and pasta (made from white flour & egg whites) - vegetarian pasta or spinach or wheat pasta is OK. Multigrain breads like Arnold's or Pepperidge [employer], or multigrain sandwich thins or flatbreads.  Diet, exercise and weight loss can reverse and cure diabetes in the early stages.  Diet, exercise and weight loss is very important in the control and prevention of complications of diabetes which affects every system in your body, ie. Brain - dementia/stroke, eyes - glaucoma/blindness, heart - heart attack/heart failure, kidneys - dialysis, stomach - gastric paralysis, intestines -  malabsorption, nerves - severe painful neuritis, circulation - gangrene & loss of a leg(s), and finally cancer and Alzheimers.    I recommend avoid fried & greasy foods,  sweets/candy, white rice (brown or wild rice or Quinoa is OK), white potatoes (sweet potatoes are OK) - anything made from white flour - bagels, doughnuts, rolls, buns, biscuits,white and wheat breads, pizza crust and traditional pasta made of white flour & egg white(vegetarian pasta or spinach or wheat pasta is OK).  Multi-grain bread is OK - like multi-grain flat bread or sandwich thins. Avoid alcohol in excess. Exercise is also important.    Eat all the vegetables you want - avoid meat, especially red meat and dairy - especially cheese.  Cheese is the most concentrated form of trans-fats which is the worst thing to clog up our arteries. Veggie cheese is OK which can be found in the fresh produce section at [facility]  or [employer] or [employer]  +++++++++++++++++++++ DASH Eating Plan  DASH stands for "Dietary Approaches to Stop Hypertension."   The DASH eating plan is a healthy eating plan that has been shown to reduce high blood pressure (hypertension). Additional health benefits may include reducing the risk of type 2 diabetes mellitus, heart disease, and stroke. The DASH eating plan may also help with weight loss. WHAT DO I NEED TO KNOW ABOUT THE DASH EATING PLAN? For the DASH eating plan, you will follow these general guidelines:  Choose foods with a percent daily value for sodium of less than 5% (as listed on the food label).  Use salt-free seasonings or herbs instead of table salt or sea salt.  Check with your health care provider or pharmacist before using salt substitutes.  Eat lower-sodium products, often labeled as "lower sodium" or "no salt added."  Eat fresh foods.  Eat more vegetables, fruits, and low-fat dairy products.  Choose whole grains. Look for the word "whole" as the first word in the ingredient  list.  Choose fish   Limit sweets, desserts, sugars, and sugary drinks.  Choose heart-healthy fats.  Eat veggie cheese   Eat more home-cooked food and less restaurant, buffet, and fast food.  Limit fried foods.  Cook foods using methods other than frying.  Limit canned vegetables. If you do use them, rinse them well to decrease the sodium.  When eating at a restaurant, ask that your food be prepared with less salt, or no salt if possible.                      WHAT FOODS CAN I EAT? Read Dr Fara Olden Fuhrman's books on The End of Dieting & The End of Diabetes  Grains Whole grain or whole wheat bread. Brown rice. Whole grain or whole wheat pasta. Quinoa, bulgur, and whole grain cereals. Low-sodium cereals. Corn or whole wheat flour tortillas. Whole grain cornbread. Whole grain crackers. Low-sodium crackers.  Vegetables Fresh or frozen vegetables (raw, steamed, roasted, or grilled). Low-sodium or reduced-sodium tomato and vegetable juices. Low-sodium or reduced-sodium tomato sauce and paste. Low-sodium or reduced-sodium canned vegetables.   Fruits All fresh, canned (in natural juice), or frozen fruits.  Protein Products  All fish and seafood.  Dried beans, peas, or lentils. Unsalted nuts and seeds. Unsalted canned beans.  Dairy Low-fat dairy products, such as skim or 1% milk, 2% or reduced-fat cheeses, low-fat ricotta or cottage cheese, or plain low-fat yogurt. Low-sodium or reduced-sodium cheeses.  Fats and Oils Tub margarines without trans fats. Light or reduced-fat mayonnaise and salad dressings (reduced sodium). Avocado. Safflower, olive, or canola oils. Natural peanut or almond butter.  Other Unsalted popcorn and pretzels. The items listed above may not be a complete list of recommended foods or beverages. Contact your dietitian for more options.  +++++++++++++++  WHAT FOODS ARE NOT RECOMMENDED? Grains/ White flour or wheat flour White bread. White pasta. White rice.  Refined cornbread. Bagels and croissants. Crackers that contain trans fat.  Vegetables  Creamed or fried vegetables. Vegetables in a . Regular canned vegetables. Regular canned tomato sauce and paste. Regular tomato and vegetable juices.  Fruits Dried fruits. Canned fruit in light or heavy syrup. Fruit juice.  Meat and Other Protein Products Meat in general - RED meat & White meat.  Fatty cuts of meat. Ribs, chicken wings, all processed meats as bacon, sausage, bologna, salami, fatback, hot dogs, bratwurst and packaged luncheon meats.  Dairy Whole or 2% milk,  cream, half-and-half, and cream cheese. Whole-fat or sweetened yogurt. Full-fat cheeses or blue cheese. Non-dairy creamers and whipped toppings. Processed cheese, cheese spreads, or cheese curds.  Condiments Onion and garlic salt, seasoned salt, table salt, and sea salt. Canned and packaged gravies. Worcestershire sauce. Tartar sauce. Barbecue sauce. Teriyaki sauce. Soy sauce, including reduced sodium. Steak sauce. Fish sauce. Oyster sauce. Cocktail sauce. Horseradish. Ketchup and mustard. Meat flavorings and tenderizers. Bouillon cubes. Hot sauce. Tabasco sauce. Marinades. Taco seasonings. Relishes.  Fats and Oils Butter, stick margarine, lard, shortening and bacon fat. Coconut, palm kernel, or palm oils. Regular salad dressings.  Pickles and olives. Salted popcorn and pretzels.  The items listed above may not be a complete list of foods and beverages to avoid.  '

## 2018-07-08 ENCOUNTER — Other Ambulatory Visit: Payer: Self-pay

## 2018-07-08 ENCOUNTER — Ambulatory Visit (INDEPENDENT_AMBULATORY_CARE_PROVIDER_SITE_OTHER): Payer: BLUE CROSS/BLUE SHIELD | Admitting: Internal Medicine

## 2018-07-08 ENCOUNTER — Other Ambulatory Visit: Payer: Self-pay | Admitting: Physician Assistant

## 2018-07-08 VITALS — BP 134/84 | HR 76 | Temp 97.2°F | Resp 16 | Ht 65.0 in | Wt 185.2 lb

## 2018-07-08 DIAGNOSIS — N401 Enlarged prostate with lower urinary tract symptoms: Secondary | ICD-10-CM

## 2018-07-08 DIAGNOSIS — E349 Endocrine disorder, unspecified: Secondary | ICD-10-CM | POA: Diagnosis not present

## 2018-07-08 DIAGNOSIS — E559 Vitamin D deficiency, unspecified: Secondary | ICD-10-CM | POA: Diagnosis not present

## 2018-07-08 DIAGNOSIS — E782 Mixed hyperlipidemia: Secondary | ICD-10-CM

## 2018-07-08 DIAGNOSIS — R0989 Other specified symptoms and signs involving the circulatory and respiratory systems: Secondary | ICD-10-CM

## 2018-07-08 DIAGNOSIS — Z79899 Other long term (current) drug therapy: Secondary | ICD-10-CM

## 2018-07-08 DIAGNOSIS — I10 Essential (primary) hypertension: Secondary | ICD-10-CM

## 2018-07-08 DIAGNOSIS — N138 Other obstructive and reflux uropathy: Secondary | ICD-10-CM

## 2018-07-08 DIAGNOSIS — Z9989 Dependence on other enabling machines and devices: Secondary | ICD-10-CM

## 2018-07-08 DIAGNOSIS — R7303 Prediabetes: Secondary | ICD-10-CM

## 2018-07-08 DIAGNOSIS — R7309 Other abnormal glucose: Secondary | ICD-10-CM

## 2018-07-08 DIAGNOSIS — G4733 Obstructive sleep apnea (adult) (pediatric): Secondary | ICD-10-CM

## 2018-07-08 MED ORDER — TAMSULOSIN HCL 0.4 MG PO CAPS: ORAL_CAPSULE | ORAL | 3 refills | Status: DC

## 2018-07-08 MED ORDER — OLMESARTAN MEDOXOMIL 40 MG PO TABS: ORAL_TABLET | ORAL | 3 refills | Status: DC

## 2018-07-08 MED ORDER — FINASTERIDE 5 MG PO TABS: ORAL_TABLET | ORAL | 3 refills | Status: DC

## 2018-07-08 MED ORDER — TADALAFIL 5 MG PO TABS: ORAL_TABLET | ORAL | 3 refills | Status: DC

## 2018-07-09 ENCOUNTER — Ambulatory Visit: Payer: BLUE CROSS/BLUE SHIELD | Admitting: Adult Health

## 2018-07-09 LAB — LIPID PANEL
Cholesterol: 117 mg/dL (ref <200)
HDL: 52 mg/dL (ref >=40)
LDL Cholesterol (Calc): 47 mg/dL (calc)
Non-HDL Cholesterol (Calc): 65 mg/dL (calc) (ref <130)
Total CHOL/HDL Ratio: 2.3 (calc) (ref <5.0)
Triglycerides: 97 mg/dL (ref <150)

## 2018-07-09 LAB — CBC WITH DIFFERENTIAL/PLATELET
Absolute Monocytes: 650 cells/uL (ref 200–950)
Basophils Absolute: 93 cells/uL (ref 0–200)
Basophils Relative: 1.6 %
Eosinophils Absolute: 58 cells/uL (ref 15–500)
Eosinophils Relative: 1 %
HCT: 49.4 % (ref 38.5–50.0)
Hemoglobin: 17.1 g/dL (ref 13.2–17.1)
Lymphs Abs: 1154 cells/uL (ref 850–3900)
MCH: 32.4 pg (ref 27.0–33.0)
MCHC: 34.6 g/dL (ref 32.0–36.0)
MCV: 93.6 fL (ref 80.0–100.0)
MPV: 11 fL (ref 7.5–12.5)
Monocytes Relative: 11.2 %
Neutro Abs: 3845 cells/uL (ref 1500–7800)
Neutrophils Relative %: 66.3 %
Platelets: 204 10*3/uL (ref 140–400)
RBC: 5.28 10*6/uL (ref 4.20–5.80)
RDW: 12 % (ref 11.0–15.0)
Total Lymphocyte: 19.9 %
WBC: 5.8 10*3/uL (ref 3.8–10.8)

## 2018-07-09 LAB — COMPLETE METABOLIC PANEL WITH GFR
AG Ratio: 2 (calc) (ref 1.0–2.5)
ALT: 35 U/L (ref 9–46)
AST: 30 U/L (ref 10–35)
Albumin: 4.7 g/dL (ref 3.6–5.1)
Alkaline phosphatase (APISO): 53 U/L (ref 35–144)
BUN: 22 mg/dL (ref 7–25)
CO2: 27 mmol/L (ref 20–32)
Calcium: 9.8 mg/dL (ref 8.6–10.3)
Chloride: 101 mmol/L (ref 98–110)
Creat: 0.93 mg/dL (ref 0.70–1.25)
GFR, Est African American: 103 mL/min/{1.73_m2} (ref >=60)
GFR, Est Non African American: 89 mL/min/{1.73_m2} (ref >=60)
Globulin: 2.3 g/dL (calc) (ref 1.9–3.7)
Glucose, Bld: 91 mg/dL (ref 65–99)
Potassium: 4 mmol/L (ref 3.5–5.3)
Sodium: 137 mmol/L (ref 135–146)
Total Bilirubin: 0.8 mg/dL (ref 0.2–1.2)
Total Protein: 7 g/dL (ref 6.1–8.1)

## 2018-07-09 LAB — MAGNESIUM: Magnesium: 2 mg/dL (ref 1.5–2.5)

## 2018-07-09 LAB — TSH: TSH: 1.11 mIU/L (ref 0.40–4.50)

## 2018-07-09 LAB — TESTOSTERONE: Testosterone: 872 ng/dL — ABNORMAL HIGH (ref 250–827)

## 2018-07-09 LAB — VITAMIN D 25 HYDROXY (VIT D DEFICIENCY, FRACTURES): Vit D, 25-Hydroxy: 90 ng/mL (ref 30–100)

## 2018-07-09 LAB — HEMOGLOBIN A1C
Hgb A1c MFr Bld: 5.2 % of total Hgb (ref <5.7)
Mean Plasma Glucose: 103 (calc)
eAG (mmol/L): 5.7 (calc)

## 2018-07-09 LAB — INSULIN, RANDOM: Insulin: 5.9 u[IU]/mL

## 2018-07-11 ENCOUNTER — Encounter: Payer: Self-pay | Admitting: Internal Medicine

## 2018-07-13 DIAGNOSIS — D0439 Carcinoma in situ of skin of other parts of face: Secondary | ICD-10-CM | POA: Diagnosis not present

## 2018-07-16 DIAGNOSIS — K080 Exfoliation of teeth due to systemic causes: Secondary | ICD-10-CM | POA: Diagnosis not present

## 2018-08-20 DIAGNOSIS — G4733 Obstructive sleep apnea (adult) (pediatric): Secondary | ICD-10-CM | POA: Diagnosis not present

## 2018-08-24 DIAGNOSIS — B078 Other viral warts: Secondary | ICD-10-CM | POA: Diagnosis not present

## 2018-08-24 DIAGNOSIS — D2372 Other benign neoplasm of skin of left lower limb, including hip: Secondary | ICD-10-CM | POA: Diagnosis not present

## 2018-08-24 DIAGNOSIS — L570 Actinic keratosis: Secondary | ICD-10-CM | POA: Diagnosis not present

## 2018-08-24 DIAGNOSIS — D2362 Other benign neoplasm of skin of left upper limb, including shoulder: Secondary | ICD-10-CM | POA: Diagnosis not present

## 2018-08-24 DIAGNOSIS — B079 Viral wart, unspecified: Secondary | ICD-10-CM | POA: Diagnosis not present

## 2018-08-24 DIAGNOSIS — D225 Melanocytic nevi of trunk: Secondary | ICD-10-CM | POA: Diagnosis not present

## 2018-09-09 ENCOUNTER — Other Ambulatory Visit: Payer: Self-pay | Admitting: Internal Medicine

## 2018-09-09 DIAGNOSIS — F411 Generalized anxiety disorder: Secondary | ICD-10-CM

## 2018-09-22 DIAGNOSIS — G4733 Obstructive sleep apnea (adult) (pediatric): Secondary | ICD-10-CM | POA: Diagnosis not present

## 2018-10-06 ENCOUNTER — Encounter: Payer: Self-pay | Admitting: Internal Medicine
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
## 2018-10-06 NOTE — Patient Instructions (Signed)
Vit D  & Vit C 1,000 mg   are recommended to help protect  against the Covid-19 and other Corona viruses.    Also it's recommended  to take  Zinc 50 mg  to help  protect against the Covid-19   and best place to get  is also on [employer]  and don't pay more than 6-8 cents /pill !   ===================================== Coronavirus (COVID-19) Are you at risk?  Are you at risk for the Coronavirus (COVID-19)?  To be considered HIGH RISK for Coronavirus (COVID-19), you have to meet the following criteria:  . Traveled to [place], [place], [place], [place] or [place]; or in the [place] to [place], [place], [place]  . or [place]; and have fever, cough, and shortness of breath within the last 2 weeks of travel OR . Been in close contact with a person diagnosed with COVID-19 within the last 2 weeks and have  . fever, cough,and shortness of breath .  . IF YOU DO NOT MEET THESE CRITERIA, YOU ARE CONSIDERED LOW RISK FOR COVID-19.  What to do if you are HIGH RISK for COVID-19?  . If you are having a medical emergency, call 911. . Seek medical care right away. Before you go to a doctor's office, urgent care or emergency department, .  call ahead and tell them about your recent travel, contact with someone diagnosed with COVID-19  .  and your symptoms.  . You should receive instructions from your physician's office regarding next steps of care.  . When you arrive at healthcare provider, tell the healthcare staff immediately you have returned from  . visiting [place], [place], [place], [place] or [place]; or traveled in the [place] to [place], [place],  . [place] or [place] in the last two weeks or you have been in close contact with a person diagnosed with  . COVID-19 in the last 2 weeks.   . Tell the health care staff about your symptoms: fever, cough and shortness of breath. . After you have been seen by a medical provider, you will be either: o Tested for  (COVID-19) and discharged home on quarantine except to seek medical care if  o symptoms worsen, and asked to  - Stay home and avoid contact with others until you get your results (4-5 days)  - Avoid travel on public transportation if possible (such as bus, train, or airplane) or o Sent to the Emergency Department by EMS for evaluation, COVID-19 testing  and  o possible admission depending on your condition and test results.  What to do if you are LOW RISK for COVID-19?  Reduce your risk of any infection by using the same precautions used for avoiding the common cold or flu:  . Wash your hands often with soap and warm water for at least 20 seconds.  If soap and water are not readily available,  . use an alcohol-based hand sanitizer with at least 60% alcohol.  . If coughing or sneezing, cover your mouth and nose by coughing or sneezing into the elbow areas of your shirt or coat, .  into a tissue or into your sleeve (not your hands). . Avoid shaking hands with others and consider head nods or verbal greetings only. . Avoid touching your eyes, nose, or mouth with unwashed hands.  . Avoid close contact with people who are sick. . Avoid places or events with large numbers of people in one location, like concerts or sporting events. .   Carefully consider travel plans you have or are making. . If you are planning any travel outside or inside the [place], visit the CDC's Travelers' Health webpage for the latest health notices. . If you have some symptoms but not all symptoms, continue to monitor at home and seek medical attention  . if your symptoms worsen. . If you are having a medical emergency, call 911.   ++++++++++++++++++++++++++++++++ Recommend Adult Low Dose Aspirin or  coated  Aspirin 81 mg daily  To reduce risk of Colon Cancer 40 %,  Skin Cancer 26 % ,  Melanoma 46%  and  Pancreatic cancer 60% ++++++++++++++++++++++++++++++++ Vitamin D goal  is between 70-100.  Please make sure that  you are taking your Vitamin D as directed.  It is very important as a natural anti-inflammatory  helping hair, skin, and nails, as well as reducing stroke and heart attack risk.  It helps your bones and helps with mood. It also decreases numerous cancer risks so please take it as directed.  Low Vit D is associated with a 200-300% higher risk for CANCER  and 200-300% higher risk for HEART   ATTACK  &  STROKE.   ...................................... It is also associated with higher death rate at younger ages,  autoimmune diseases like Rheumatoid arthritis, Lupus, Multiple Sclerosis.    Also many other serious conditions, like depression, Alzheimer's Dementia, infertility, muscle aches, fatigue, fibromyalgia - just to name a few. ++++++++++++++++++++ Recommend the book "The END of DIETING" by Dr Leman Fuhrman  & the book "The END of DIABETES " by Dr Rahim Fuhrman At [employer] - get book & Audio CD's    Being diabetic has a  300% increased risk for heart attack, stroke, cancer, and alzheimer- type vascular dementia. It is very important that you work harder with diet by avoiding all foods that are white. Avoid white rice (brown & wild rice is OK), white potatoes (sweetpotatoes in moderation is OK), White bread or wheat bread or anything made out of white flour like bagels, donuts, rolls, buns, biscuits, cakes, pastries, cookies, pizza crust, and pasta (made from white flour & egg whites) - vegetarian pasta or spinach or wheat pasta is OK. Multigrain breads like Arnold's or Pepperidge Farm, or multigrain sandwich thins or flatbreads.  Diet, exercise and weight loss can reverse and cure diabetes in the early stages.  Diet, exercise and weight loss is very important in the control and prevention of complications of diabetes which affects every system in your body, ie. Brain - dementia/stroke, eyes - glaucoma/blindness, heart - heart attack/heart failure, kidneys - dialysis, stomach - gastric paralysis,  intestines - malabsorption, nerves - severe painful neuritis, circulation - gangrene & loss of a leg(s), and finally cancer and Alzheimers.    I recommend avoid fried & greasy foods,  sweets/candy, white rice (brown or wild rice or Quinoa is OK), white potatoes (sweet potatoes are OK) - anything made from white flour - bagels, doughnuts, rolls, buns, biscuits,white and wheat breads, pizza crust and traditional pasta made of white flour & egg white(vegetarian pasta or spinach or wheat pasta is OK).  Multi-grain bread is OK - like multi-grain flat bread or sandwich thins. Avoid alcohol in excess. Exercise is also important.    Eat all the vegetables you want - avoid meat, especially red meat and dairy - especially cheese.  Cheese is the most concentrated form of trans-fats which is the worst thing to clog up our arteries. Veggie cheese is OK which can be   found in the fresh produce section at [employer] or [employer] or [employer]  +++++++++++++++++++++ DASH Eating Plan  DASH stands for "Dietary Approaches to Stop Hypertension."   The DASH eating plan is a healthy eating plan that has been shown to reduce high blood pressure (hypertension). Additional health benefits may include reducing the risk of type 2 diabetes mellitus, heart disease, and stroke. The DASH eating plan may also help with weight loss. WHAT DO I NEED TO KNOW ABOUT THE DASH EATING PLAN? For the DASH eating plan, you will follow these general guidelines:  Choose foods with a percent daily value for sodium of less than 5% (as listed on the food label).  Use salt-free seasonings or herbs instead of table salt or sea salt.  Check with your health care provider or pharmacist before using salt substitutes.  Eat lower-sodium products, often labeled as "lower sodium" or "no salt added."  Eat fresh foods.  Eat more vegetables, fruits, and low-fat dairy products.  Choose whole grains. Look for the word "whole" as the first word in  the ingredient list.  Choose fish   Limit sweets, desserts, sugars, and sugary drinks.  Choose heart-healthy fats.  Eat veggie cheese   Eat more home-cooked food and less restaurant, buffet, and fast food.  Limit fried foods.  Cook foods using methods other than frying.  Limit canned vegetables. If you do use them, rinse them well to decrease the sodium.  When eating at a restaurant, ask that your food be prepared with less salt, or no salt if possible.                      WHAT FOODS CAN I EAT? Read Dr Smaran Fuhrman's books on The End of Dieting & The End of Diabetes  Grains Whole grain or whole wheat bread. Brown rice. Whole grain or whole wheat pasta. Quinoa, bulgur, and whole grain cereals. Low-sodium cereals. Corn or whole wheat flour tortillas. Whole grain cornbread. Whole grain crackers. Low-sodium crackers.  Vegetables Fresh or frozen vegetables (raw, steamed, roasted, or grilled). Low-sodium or reduced-sodium tomato and vegetable juices. Low-sodium or reduced-sodium tomato sauce and paste. Low-sodium or reduced-sodium canned vegetables.   Fruits All fresh, canned (in natural juice), or frozen fruits.  Protein Products  All fish and seafood.  Dried beans, peas, or lentils. Unsalted nuts and seeds. Unsalted canned beans.  Dairy Low-fat dairy products, such as skim or 1% milk, 2% or reduced-fat cheeses, low-fat ricotta or cottage cheese, or plain low-fat yogurt. Low-sodium or reduced-sodium cheeses.  Fats and Oils Tub margarines without trans fats. Light or reduced-fat mayonnaise and salad dressings (reduced sodium). Avocado. Safflower, olive, or canola oils. Natural peanut or almond butter.  Other Unsalted popcorn and pretzels. The items listed above may not be a complete list of recommended foods or beverages. Contact your dietitian for more options.  +++++++++++++++  WHAT FOODS ARE NOT RECOMMENDED? Grains/ White flour or wheat flour White bread. White pasta.  White rice. Refined cornbread. Bagels and croissants. Crackers that contain trans fat.  Vegetables  Creamed or fried vegetables. Vegetables in a . Regular canned vegetables. Regular canned tomato sauce and paste. Regular tomato and vegetable juices.  Fruits Dried fruits. Canned fruit in light or heavy syrup. Fruit juice.  Meat and Other Protein Products Meat in general - RED meat & White meat.  Fatty cuts of meat. Ribs, chicken wings, all processed meats as bacon, sausage, bologna, salami, fatback, hot dogs, bratwurst and packaged   luncheon meats.  Dairy Whole or 2% milk, cream, half-and-half, and cream cheese. Whole-fat or sweetened yogurt. Full-fat cheeses or blue cheese. Non-dairy creamers and whipped toppings. Processed cheese, cheese spreads, or cheese curds.  Condiments Onion and garlic salt, seasoned salt, table salt, and sea salt. Canned and packaged gravies. Worcestershire sauce. Tartar sauce. Barbecue sauce. Teriyaki sauce. Soy sauce, including reduced sodium. Steak sauce. Fish sauce. Oyster sauce. Cocktail sauce. Horseradish. Ketchup and mustard. Meat flavorings and tenderizers. Bouillon cubes. Hot sauce. Tabasco sauce. Marinades. Taco seasonings. Relishes.  Fats and Oils Butter, stick margarine, lard, shortening and bacon fat. Coconut, palm kernel, or palm oils. Regular salad dressings.  Pickles and olives. Salted popcorn and pretzels.  The items listed above may not be a complete list of foods and beverages to avoid.   

## 2018-10-07 ENCOUNTER — Ambulatory Visit (INDEPENDENT_AMBULATORY_CARE_PROVIDER_SITE_OTHER): Payer: BC Managed Care – PPO | Admitting: Internal Medicine

## 2018-10-07 ENCOUNTER — Other Ambulatory Visit: Payer: Self-pay

## 2018-10-07 VITALS — BP 126/70 | HR 60 | Temp 97.6°F | Resp 16 | Ht 65.0 in | Wt 187.0 lb

## 2018-10-07 DIAGNOSIS — R7303 Prediabetes: Secondary | ICD-10-CM

## 2018-10-07 DIAGNOSIS — E559 Vitamin D deficiency, unspecified: Secondary | ICD-10-CM

## 2018-10-07 DIAGNOSIS — Z79899 Other long term (current) drug therapy: Secondary | ICD-10-CM | POA: Diagnosis not present

## 2018-10-07 DIAGNOSIS — I10 Essential (primary) hypertension: Secondary | ICD-10-CM

## 2018-10-07 DIAGNOSIS — Z9989 Dependence on other enabling machines and devices: Secondary | ICD-10-CM

## 2018-10-07 DIAGNOSIS — E349 Endocrine disorder, unspecified: Secondary | ICD-10-CM

## 2018-10-07 DIAGNOSIS — R7309 Other abnormal glucose: Secondary | ICD-10-CM | POA: Diagnosis not present

## 2018-10-07 DIAGNOSIS — E782 Mixed hyperlipidemia: Secondary | ICD-10-CM | POA: Diagnosis not present

## 2018-10-07 DIAGNOSIS — G4733 Obstructive sleep apnea (adult) (pediatric): Secondary | ICD-10-CM

## 2018-10-08 ENCOUNTER — Encounter: Payer: Self-pay | Admitting: Internal Medicine

## 2018-10-08 LAB — COMPLETE METABOLIC PANEL WITH GFR
AG Ratio: 1.9 (calc) (ref 1.0–2.5)
ALT: 29 U/L (ref 9–46)
AST: 27 U/L (ref 10–35)
Albumin: 4.6 g/dL (ref 3.6–5.1)
Alkaline phosphatase (APISO): 54 U/L (ref 35–144)
BUN: 21 mg/dL (ref 7–25)
CO2: 25 mmol/L (ref 20–32)
Calcium: 9.6 mg/dL (ref 8.6–10.3)
Chloride: 103 mmol/L (ref 98–110)
Creat: 0.87 mg/dL (ref 0.70–1.25)
GFR, Est African American: 109 mL/min/{1.73_m2} (ref >=60)
GFR, Est Non African American: 94 mL/min/{1.73_m2} (ref >=60)
Globulin: 2.4 g/dL (calc) (ref 1.9–3.7)
Glucose, Bld: 88 mg/dL (ref 65–99)
Potassium: 4.1 mmol/L (ref 3.5–5.3)
Sodium: 137 mmol/L (ref 135–146)
Total Bilirubin: 0.6 mg/dL (ref 0.2–1.2)
Total Protein: 7 g/dL (ref 6.1–8.1)

## 2018-10-08 LAB — CBC WITH DIFFERENTIAL/PLATELET
Absolute Monocytes: 682 cells/uL (ref 200–950)
Basophils Absolute: 61 cells/uL (ref 0–200)
Basophils Relative: 1.1 %
Eosinophils Absolute: 121 cells/uL (ref 15–500)
Eosinophils Relative: 2.2 %
HCT: 47.1 % (ref 38.5–50.0)
Hemoglobin: 16 g/dL (ref 13.2–17.1)
Lymphs Abs: 1144 cells/uL (ref 850–3900)
MCH: 32.5 pg (ref 27.0–33.0)
MCHC: 34 g/dL (ref 32.0–36.0)
MCV: 95.5 fL (ref 80.0–100.0)
MPV: 11.5 fL (ref 7.5–12.5)
Monocytes Relative: 12.4 %
Neutro Abs: 3493 cells/uL (ref 1500–7800)
Neutrophils Relative %: 63.5 %
Platelets: 203 10*3/uL (ref 140–400)
RBC: 4.93 10*6/uL (ref 4.20–5.80)
RDW: 12.2 % (ref 11.0–15.0)
Total Lymphocyte: 20.8 %
WBC: 5.5 10*3/uL (ref 3.8–10.8)

## 2018-10-08 LAB — MAGNESIUM: Magnesium: 2.2 mg/dL (ref 1.5–2.5)

## 2018-10-08 LAB — LIPID PANEL
Cholesterol: 118 mg/dL (ref <200)
HDL: 53 mg/dL (ref >=40)
LDL Cholesterol (Calc): 43 mg/dL (calc)
Non-HDL Cholesterol (Calc): 65 mg/dL (calc) (ref <130)
Total CHOL/HDL Ratio: 2.2 (calc) (ref <5.0)
Triglycerides: 139 mg/dL (ref <150)

## 2018-10-08 LAB — VITAMIN D 25 HYDROXY (VIT D DEFICIENCY, FRACTURES): Vit D, 25-Hydroxy: 91 ng/mL (ref 30–100)

## 2018-10-08 LAB — INSULIN, RANDOM: Insulin: 4.1 u[IU]/mL

## 2018-10-08 LAB — HEMOGLOBIN A1C
Hgb A1c MFr Bld: 5.2 % of total Hgb (ref <5.7)
Mean Plasma Glucose: 103 (calc)
eAG (mmol/L): 5.7 (calc)

## 2018-10-08 LAB — TSH: TSH: 1.11 mIU/L (ref 0.40–4.50)

## 2018-10-08 LAB — TESTOSTERONE: Testosterone: 1109 ng/dL — ABNORMAL HIGH (ref 250–827)

## 2018-11-22 DIAGNOSIS — G4733 Obstructive sleep apnea (adult) (pediatric): Secondary | ICD-10-CM | POA: Diagnosis not present

## 2018-11-23 ENCOUNTER — Encounter: Payer: Self-pay | Admitting: Podiatry

## 2018-11-23 ENCOUNTER — Ambulatory Visit (INDEPENDENT_AMBULATORY_CARE_PROVIDER_SITE_OTHER): Payer: BC Managed Care – PPO

## 2018-11-23 ENCOUNTER — Other Ambulatory Visit: Payer: Self-pay | Admitting: Podiatry

## 2018-11-23 ENCOUNTER — Other Ambulatory Visit: Payer: Self-pay

## 2018-11-23 ENCOUNTER — Ambulatory Visit: Payer: BC Managed Care – PPO | Admitting: Podiatry

## 2018-11-23 DIAGNOSIS — M79672 Pain in left foot: Secondary | ICD-10-CM

## 2018-11-23 DIAGNOSIS — M779 Enthesopathy, unspecified: Secondary | ICD-10-CM | POA: Diagnosis not present

## 2018-11-23 DIAGNOSIS — M778 Other enthesopathies, not elsewhere classified: Secondary | ICD-10-CM

## 2018-11-23 NOTE — Progress Notes (Signed)
Subjective:   Patient ID: [name], male   DOB: 58 y.o.   MRN: [id number]   HPI Patient presents stating the top of the foot has started to hurt again over the last couple months and has been very busy with karate and golfing and states overall he is been doing decently   ROS      Objective:  Physical Exam  Neurovascular status intact with inflammation pain dorsal lateral aspect left foot with fluid buildup around the extensor tendon complex     Assessment:  Acute extensor tendinitis left with inflammation of the extensor tendon group lateral side     Plan:  H&P x-ray reviewed sterile prep done and injected the lateral tendon group 3 mg Kenalog 5 mg Xylocaine advised on ice anti-inflammatories physical therapy and reappoint to recheck  X-rays indicate that there is no signs of fracture or other acute pathology associated with this condition

## 2018-12-09 ENCOUNTER — Telehealth: Payer: Self-pay | Admitting: *Deleted

## 2018-12-09 NOTE — Telephone Encounter (Signed)
I spoke with pt and informed that when an injection is given the injected fluid displaces cells and breaks capillaries, and the blood in the capillaries causes bruising and as the bruising ages it darkens as the blood cells die and come to the surface of the skin, so it could be normal, as long as there is no redness, or swelling. Pt states then he should be fine. I gave my [facility] email for the photo.

## 2018-12-09 NOTE — Telephone Encounter (Signed)
Pt states he received an injection 2 weeks ago and the area is deep black and purple and he would like to send a photo.

## 2018-12-14 ENCOUNTER — Ambulatory Visit (INDEPENDENT_AMBULATORY_CARE_PROVIDER_SITE_OTHER): Payer: BC Managed Care – PPO | Admitting: *Deleted

## 2018-12-14 ENCOUNTER — Other Ambulatory Visit: Payer: Self-pay

## 2018-12-14 VITALS — Temp 97.2°F

## 2018-12-14 DIAGNOSIS — Z23 Encounter for immunization: Secondary | ICD-10-CM | POA: Diagnosis not present

## 2018-12-16 ENCOUNTER — Ambulatory Visit: Payer: BC Managed Care – PPO

## 2018-12-19 ENCOUNTER — Other Ambulatory Visit: Payer: Self-pay | Admitting: Internal Medicine

## 2018-12-23 ENCOUNTER — Other Ambulatory Visit: Payer: Self-pay

## 2018-12-23 DIAGNOSIS — Z20822 Contact with and (suspected) exposure to covid-19: Secondary | ICD-10-CM

## 2018-12-25 LAB — NOVEL CORONAVIRUS, NAA: SARS-CoV-2, NAA: NOT DETECTED

## 2018-12-31 ENCOUNTER — Other Ambulatory Visit: Payer: Self-pay | Admitting: Internal Medicine

## 2019-01-05 DIAGNOSIS — G4733 Obstructive sleep apnea (adult) (pediatric): Secondary | ICD-10-CM | POA: Diagnosis not present

## 2019-01-10 ENCOUNTER — Encounter: Payer: Self-pay | Admitting: Internal Medicine

## 2019-01-10 ENCOUNTER — Ambulatory Visit (INDEPENDENT_AMBULATORY_CARE_PROVIDER_SITE_OTHER): Payer: BC Managed Care – PPO | Admitting: Internal Medicine

## 2019-01-10 ENCOUNTER — Other Ambulatory Visit: Payer: Self-pay

## 2019-01-10 VITALS — BP 130/80 | HR 97 | Temp 97.4°F | Resp 16 | Ht 65.0 in | Wt 190.8 lb

## 2019-01-10 DIAGNOSIS — I10 Essential (primary) hypertension: Secondary | ICD-10-CM | POA: Diagnosis not present

## 2019-01-10 DIAGNOSIS — G4733 Obstructive sleep apnea (adult) (pediatric): Secondary | ICD-10-CM

## 2019-01-10 DIAGNOSIS — E349 Endocrine disorder, unspecified: Secondary | ICD-10-CM

## 2019-01-10 DIAGNOSIS — Z79899 Other long term (current) drug therapy: Secondary | ICD-10-CM | POA: Diagnosis not present

## 2019-01-10 DIAGNOSIS — E559 Vitamin D deficiency, unspecified: Secondary | ICD-10-CM | POA: Diagnosis not present

## 2019-01-10 DIAGNOSIS — E782 Mixed hyperlipidemia: Secondary | ICD-10-CM

## 2019-01-10 DIAGNOSIS — R7309 Other abnormal glucose: Secondary | ICD-10-CM | POA: Diagnosis not present

## 2019-01-10 DIAGNOSIS — Z9989 Dependence on other enabling machines and devices: Secondary | ICD-10-CM

## 2019-01-10 NOTE — Patient Instructions (Signed)
Vit D   & Vit C 1,000 mg    are recommended to help protect  against the Covid-19 and other Corona viruses.    Also it's recommended  to take   Zinc 50 mg   to help  protect against the Covid-19   and best place to get  is also on [email or web address]  and don't pay more than 3 - 5  cents /pill !   ===================================== Coronavirus (COVID-19) Are you at risk?  Are you at risk for the Coronavirus (COVID-19)?  To be considered HIGH RISK for Coronavirus (COVID-19), you have to meet the following criteria:  . Traveled to [place], [place], [place], [place] or [place]; or in the [place] to [place], [place], [place]  . or [place]; and have fever, cough, and shortness of breath within the last 2 weeks of travel OR . Been in close contact with a person diagnosed with COVID-19 within the last 2 weeks and have  . fever, cough,and shortness of breath .  . IF YOU DO NOT MEET THESE CRITERIA, YOU ARE CONSIDERED LOW RISK FOR COVID-19.  What to do if you are HIGH RISK for COVID-19?  Marland Kitchen If you are having a medical emergency, call 911. . Seek medical care right away. Before you go to a doctor's office, urgent care or emergency department, .  call ahead and tell them about your recent travel, contact with someone diagnosed with COVID-19  .  and your symptoms.  . You should receive instructions from your physician's office regarding next steps of care.  . When you arrive at healthcare provider, tell the healthcare staff immediately you have returned from  . visiting [place], [place], [place], [place] or [place]; or traveled in the [place] to [place], [place],  . [place] or [place] in the last two weeks or you have been in close contact with a person diagnosed with  . COVID-19 in the last 2 weeks.   . Tell the health care staff about your symptoms: fever, cough and shortness of breath. . After you have been seen by a medical provider, you will be either: o Tested  for (COVID-19) and discharged home on quarantine except to seek medical care if  o symptoms worsen, and asked to  - Stay home and avoid contact with others until you get your results (4-5 days)  - Avoid travel on public transportation if possible (such as bus, train, or airplane) or o Sent to the Emergency Department by EMS for evaluation, COVID-19 testing  and  o possible admission depending on your condition and test results.  What to do if you are LOW RISK for COVID-19?  Reduce your risk of any infection by using the same precautions used for avoiding the common cold or flu:  Marland Kitchen Wash your hands often with soap and warm water for at least 20 seconds.  If soap and water are not readily available,  . use an alcohol-based hand sanitizer with at least 60% alcohol.  . If coughing or sneezing, cover your mouth and nose by coughing or sneezing into the elbow areas of your shirt or coat, .  into a tissue or into your sleeve (not your hands). . Avoid shaking hands with others and consider head nods or verbal greetings only. . Avoid touching your eyes, nose, or mouth with unwashed hands.  . Avoid close contact with people who are sick. . Avoid places or events with large numbers of people in  one location, like concerts or sporting events. . Carefully consider travel plans you have or are making. . If you are planning any travel outside or inside the [place], visit the CDC's Travelers' Health webpage for the latest health notices. . If you have some symptoms but not all symptoms, continue to monitor at home and seek medical attention  . if your symptoms worsen. . If you are having a medical emergency, call 911.   ++++++++++++++++++++++++++++++++ Recommend Adult Low Dose Aspirin or  coated  Aspirin 81 mg daily  To reduce risk of Colon Cancer 40 %,  Skin Cancer 26 % ,  Melanoma 46%  and  Pancreatic cancer 60% ++++++++++++++++++++++++++++++++ Vitamin D goal  is between 70-100.  Please make sure  that you are taking your Vitamin D as directed.  It is very important as a natural anti-inflammatory  helping hair, skin, and nails, as well as reducing stroke and heart attack risk.  It helps your bones and helps with mood. It also decreases numerous cancer risks so please take it as directed.  Low Vit D is associated with a 200-300% higher risk for CANCER  and 200-300% higher risk for HEART   ATTACK  &  STROKE.   .....................................Marland Kitchen It is also associated with higher death rate at younger ages,  autoimmune diseases like Rheumatoid arthritis, Lupus, Multiple Sclerosis.    Also many other serious conditions, like depression, Alzheimer's Dementia, infertility, muscle aches, fatigue, fibromyalgia - just to name a few. ++++++++++++++++++++ Recommend the book "The END of DIETING" by Dr Excell Seltzer  & the book "The END of DIABETES " by Dr Excell Seltzer At [email or web address] - get book & Audio CD's    Being diabetic has a  300% increased risk for heart attack, stroke, cancer, and alzheimer- type vascular dementia. It is very important that you work harder with diet by avoiding all foods that are white. Avoid white rice (brown & wild rice is OK), white potatoes (sweetpotatoes in moderation is OK), White bread or wheat bread or anything made out of white flour like bagels, donuts, rolls, buns, biscuits, cakes, pastries, cookies, pizza crust, and pasta (made from white flour & egg whites) - vegetarian pasta or spinach or wheat pasta is OK. Multigrain breads like Arnold's or [employer], or multigrain sandwich thins or flatbreads.  Diet, exercise and weight loss can reverse and cure diabetes in the early stages.  Diet, exercise and weight loss is very important in the control and prevention of complications of diabetes which affects every system in your body, ie. Brain - dementia/stroke, eyes - glaucoma/blindness, heart - heart attack/heart failure, kidneys - dialysis, stomach - gastric  paralysis, intestines - malabsorption, nerves - severe painful neuritis, circulation - gangrene & loss of a leg(s), and finally cancer and Alzheimers.    I recommend avoid fried & greasy foods,  sweets/candy, white rice (brown or wild rice or Quinoa is OK), white potatoes (sweet potatoes are OK) - anything made from white flour - bagels, doughnuts, rolls, buns, biscuits,white and wheat breads, pizza crust and traditional pasta made of white flour & egg white(vegetarian pasta or spinach or wheat pasta is OK).  Multi-grain bread is OK - like multi-grain flat bread or sandwich thins. Avoid alcohol in excess. Exercise is also important.    Eat all the vegetables you want - avoid meat, especially red meat and dairy - especially cheese.  Cheese is the most concentrated form of trans-fats which is the worst thing to clog up our  arteries. Veggie cheese is OK which can be found in the fresh produce section at [employer] or [employer] or [employer]  +++++++++++++++++++++ DASH Eating Plan  DASH stands for "Dietary Approaches to Stop Hypertension."   The DASH eating plan is a healthy eating plan that has been shown to reduce high blood pressure (hypertension). Additional health benefits may include reducing the risk of type 2 diabetes mellitus, heart disease, and stroke. The DASH eating plan may also help with weight loss. WHAT DO I NEED TO KNOW ABOUT THE DASH EATING PLAN? For the DASH eating plan, you will follow these general guidelines:  Choose foods with a percent daily value for sodium of less than 5% (as listed on the food label).  Use salt-free seasonings or herbs instead of table salt or sea salt.  Check with your health care provider or pharmacist before using salt substitutes.  Eat lower-sodium products, often labeled as "lower sodium" or "no salt added."  Eat fresh foods.  Eat more vegetables, fruits, and low-fat dairy products.  Choose whole grains. Look for the word "whole" as the  first word in the ingredient list.  Choose fish   Limit sweets, desserts, sugars, and sugary drinks.  Choose heart-healthy fats.  Eat veggie cheese   Eat more home-cooked food and less restaurant, buffet, and fast food.  Limit fried foods.  Cook foods using methods other than frying.  Limit canned vegetables. If you do use them, rinse them well to decrease the sodium.  When eating at a restaurant, ask that your food be prepared with less salt, or no salt if possible.                      WHAT FOODS CAN I EAT? Read Dr Fara Olden Fuhrman's books on The End of Dieting & The End of Diabetes  Grains Whole grain or whole wheat bread. Brown rice. Whole grain or whole wheat pasta. Quinoa, bulgur, and whole grain cereals. Low-sodium cereals. Corn or whole wheat flour tortillas. Whole grain cornbread. Whole grain crackers. Low-sodium crackers.  Vegetables Fresh or frozen vegetables (raw, steamed, roasted, or grilled). Low-sodium or reduced-sodium tomato and vegetable juices. Low-sodium or reduced-sodium tomato sauce and paste. Low-sodium or reduced-sodium canned vegetables.   Fruits All fresh, canned (in natural juice), or frozen fruits.  Protein Products  All fish and seafood.  Dried beans, peas, or lentils. Unsalted nuts and seeds. Unsalted canned beans.  Dairy Low-fat dairy products, such as skim or 1% milk, 2% or reduced-fat cheeses, low-fat ricotta or cottage cheese, or plain low-fat yogurt. Low-sodium or reduced-sodium cheeses.  Fats and Oils Tub margarines without trans fats. Light or reduced-fat mayonnaise and salad dressings (reduced sodium). Avocado. Safflower, olive, or canola oils. Natural peanut or almond butter.  Other Unsalted popcorn and pretzels. The items listed above may not be a complete list of recommended foods or beverages. Contact your dietitian for more options.  +++++++++++++++  WHAT FOODS ARE NOT RECOMMENDED? Grains/ White flour or wheat flour White bread.  White pasta. White rice. Refined cornbread. Bagels and croissants. Crackers that contain trans fat.  Vegetables  Creamed or fried vegetables. Vegetables in a . Regular canned vegetables. Regular canned tomato sauce and paste. Regular tomato and vegetable juices.  Fruits Dried fruits. Canned fruit in light or heavy syrup. Fruit juice.  Meat and Other Protein Products Meat in general - RED meat & White meat.  Fatty cuts of meat. Ribs, chicken wings, all processed meats as bacon, sausage,  bologna, salami, fatback, hot dogs, bratwurst and packaged luncheon meats.  Dairy Whole or 2% milk, cream, half-and-half, and cream cheese. Whole-fat or sweetened yogurt. Full-fat cheeses or blue cheese. Non-dairy creamers and whipped toppings. Processed cheese, cheese spreads, or cheese curds.  Condiments Onion and garlic salt, seasoned salt, table salt, and sea salt. Canned and packaged gravies. Worcestershire sauce. Tartar sauce. Barbecue sauce. Teriyaki sauce. Soy sauce, including reduced sodium. Steak sauce. Fish sauce. Oyster sauce. Cocktail sauce. Horseradish. Ketchup and mustard. Meat flavorings and tenderizers. Bouillon cubes. Hot sauce. Tabasco sauce. Marinades. Taco seasonings. Relishes.  Fats and Oils Butter, stick margarine, lard, shortening and bacon fat. Coconut, palm kernel, or palm oils. Regular salad dressings.  Pickles and olives. Salted popcorn and pretzels.  The items listed above may not be a complete list of foods and beverages to avoid.
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
## 2019-01-11 LAB — CBC WITH DIFFERENTIAL/PLATELET
Absolute Monocytes: 698 cells/uL (ref 200–950)
Basophils Absolute: 70 cells/uL (ref 0–200)
Basophils Relative: 1.1 %
Eosinophils Absolute: 70 cells/uL (ref 15–500)
Eosinophils Relative: 1.1 %
HCT: 45.1 % (ref 38.5–50.0)
Hemoglobin: 15.5 g/dL (ref 13.2–17.1)
Lymphs Abs: 1088 cells/uL (ref 850–3900)
MCH: 33.4 pg — ABNORMAL HIGH (ref 27.0–33.0)
MCHC: 34.4 g/dL (ref 32.0–36.0)
MCV: 97.2 fL (ref 80.0–100.0)
MPV: 11.5 fL (ref 7.5–12.5)
Monocytes Relative: 10.9 %
Neutro Abs: 4474 cells/uL (ref 1500–7800)
Neutrophils Relative %: 69.9 %
Platelets: 179 10*3/uL (ref 140–400)
RBC: 4.64 10*6/uL (ref 4.20–5.80)
RDW: 12.1 % (ref 11.0–15.0)
Total Lymphocyte: 17 %
WBC: 6.4 10*3/uL (ref 3.8–10.8)

## 2019-01-11 LAB — TSH: TSH: 0.93 mIU/L (ref 0.40–4.50)

## 2019-01-11 LAB — LIPID PANEL
Cholesterol: 114 mg/dL (ref <200)
HDL: 50 mg/dL (ref >=40)
LDL Cholesterol (Calc): 45 mg/dL (calc)
Non-HDL Cholesterol (Calc): 64 mg/dL (calc) (ref <130)
Total CHOL/HDL Ratio: 2.3 (calc) (ref <5.0)
Triglycerides: 104 mg/dL (ref <150)

## 2019-01-11 LAB — COMPLETE METABOLIC PANEL WITH GFR
AG Ratio: 2 (calc) (ref 1.0–2.5)
ALT: 32 U/L (ref 9–46)
AST: 30 U/L (ref 10–35)
Albumin: 4.4 g/dL (ref 3.6–5.1)
Alkaline phosphatase (APISO): 45 U/L (ref 35–144)
BUN: 25 mg/dL (ref 7–25)
CO2: 29 mmol/L (ref 20–32)
Calcium: 9.4 mg/dL (ref 8.6–10.3)
Chloride: 104 mmol/L (ref 98–110)
Creat: 0.92 mg/dL (ref 0.70–1.25)
GFR, Est African American: 104 mL/min/{1.73_m2} (ref >=60)
GFR, Est Non African American: 90 mL/min/{1.73_m2} (ref >=60)
Globulin: 2.2 g/dL (calc) (ref 1.9–3.7)
Glucose, Bld: 95 mg/dL (ref 65–99)
Potassium: 4.2 mmol/L (ref 3.5–5.3)
Sodium: 139 mmol/L (ref 135–146)
Total Bilirubin: 0.6 mg/dL (ref 0.2–1.2)
Total Protein: 6.6 g/dL (ref 6.1–8.1)

## 2019-01-11 LAB — HEMOGLOBIN A1C
Hgb A1c MFr Bld: 5.1 % of total Hgb (ref <5.7)
Mean Plasma Glucose: 100 (calc)
eAG (mmol/L): 5.5 (calc)

## 2019-01-11 LAB — MAGNESIUM: Magnesium: 2.1 mg/dL (ref 1.5–2.5)

## 2019-01-11 LAB — VITAMIN D 25 HYDROXY (VIT D DEFICIENCY, FRACTURES): Vit D, 25-Hydroxy: 87 ng/mL (ref 30–100)

## 2019-01-11 LAB — TESTOSTERONE: Testosterone: 1196 ng/dL — ABNORMAL HIGH (ref 250–827)

## 2019-01-11 LAB — INSULIN, RANDOM: Insulin: 5 u[IU]/mL

## 2019-01-17 ENCOUNTER — Other Ambulatory Visit: Payer: Self-pay | Admitting: Internal Medicine

## 2019-02-21 DIAGNOSIS — G4733 Obstructive sleep apnea (adult) (pediatric): Secondary | ICD-10-CM | POA: Diagnosis not present

## 2019-03-16 ENCOUNTER — Other Ambulatory Visit: Payer: Self-pay | Admitting: Internal Medicine

## 2019-03-16 DIAGNOSIS — K5732 Diverticulitis of large intestine without perforation or abscess without bleeding: Secondary | ICD-10-CM

## 2019-03-16 MED ORDER — CIPROFLOXACIN HCL 500 MG PO TABS: ORAL_TABLET | ORAL | 3 refills | Status: DC

## 2019-03-16 MED ORDER — METRONIDAZOLE 500 MG PO TABS: ORAL_TABLET | ORAL | 3 refills | Status: DC

## 2019-04-05 DIAGNOSIS — G4733 Obstructive sleep apnea (adult) (pediatric): Secondary | ICD-10-CM | POA: Diagnosis not present

## 2019-04-20 ENCOUNTER — Ambulatory Visit (INDEPENDENT_AMBULATORY_CARE_PROVIDER_SITE_OTHER): Payer: BC Managed Care – PPO | Admitting: Internal Medicine

## 2019-04-20 ENCOUNTER — Encounter: Payer: Self-pay | Admitting: Internal Medicine

## 2019-04-20 ENCOUNTER — Other Ambulatory Visit: Payer: Self-pay

## 2019-04-20 VITALS — BP 130/74 | HR 68 | Temp 97.2°F | Resp 16 | Ht 65.0 in | Wt 191.2 lb

## 2019-04-20 DIAGNOSIS — G4733 Obstructive sleep apnea (adult) (pediatric): Secondary | ICD-10-CM

## 2019-04-20 DIAGNOSIS — Z9989 Dependence on other enabling machines and devices: Secondary | ICD-10-CM

## 2019-04-20 DIAGNOSIS — Z8249 Family history of ischemic heart disease and other diseases of the circulatory system: Secondary | ICD-10-CM | POA: Diagnosis not present

## 2019-04-20 DIAGNOSIS — Z1389 Encounter for screening for other disorder: Secondary | ICD-10-CM | POA: Diagnosis not present

## 2019-04-20 DIAGNOSIS — Z87891 Personal history of nicotine dependence: Secondary | ICD-10-CM

## 2019-04-20 DIAGNOSIS — Z13220 Encounter for screening for lipoid disorders: Secondary | ICD-10-CM | POA: Diagnosis not present

## 2019-04-20 DIAGNOSIS — R350 Frequency of micturition: Secondary | ICD-10-CM | POA: Diagnosis not present

## 2019-04-20 DIAGNOSIS — Z125 Encounter for screening for malignant neoplasm of prostate: Secondary | ICD-10-CM

## 2019-04-20 DIAGNOSIS — I10 Essential (primary) hypertension: Secondary | ICD-10-CM | POA: Diagnosis not present

## 2019-04-20 DIAGNOSIS — E349 Endocrine disorder, unspecified: Secondary | ICD-10-CM

## 2019-04-20 DIAGNOSIS — Z130 Encounter for screening for diseases of the blood and blood-forming organs and certain disorders involving the immune mechanism: Secondary | ICD-10-CM | POA: Diagnosis not present

## 2019-04-20 DIAGNOSIS — N138 Other obstructive and reflux uropathy: Secondary | ICD-10-CM

## 2019-04-20 DIAGNOSIS — Z136 Encounter for screening for cardiovascular disorders: Secondary | ICD-10-CM | POA: Diagnosis not present

## 2019-04-20 DIAGNOSIS — N401 Enlarged prostate with lower urinary tract symptoms: Secondary | ICD-10-CM

## 2019-04-20 DIAGNOSIS — Z1212 Encounter for screening for malignant neoplasm of rectum: Secondary | ICD-10-CM

## 2019-04-20 DIAGNOSIS — Z131 Encounter for screening for diabetes mellitus: Secondary | ICD-10-CM | POA: Diagnosis not present

## 2019-04-20 DIAGNOSIS — I700 Atherosclerosis of aorta: Secondary | ICD-10-CM

## 2019-04-20 DIAGNOSIS — J301 Allergic rhinitis due to pollen: Secondary | ICD-10-CM

## 2019-04-20 DIAGNOSIS — E559 Vitamin D deficiency, unspecified: Secondary | ICD-10-CM

## 2019-04-20 DIAGNOSIS — R7309 Other abnormal glucose: Secondary | ICD-10-CM

## 2019-04-20 DIAGNOSIS — Z111 Encounter for screening for respiratory tuberculosis: Secondary | ICD-10-CM

## 2019-04-20 DIAGNOSIS — Z0000 Encounter for general adult medical examination without abnormal findings: Secondary | ICD-10-CM | POA: Diagnosis not present

## 2019-04-20 DIAGNOSIS — Z1211 Encounter for screening for malignant neoplasm of colon: Secondary | ICD-10-CM

## 2019-04-20 DIAGNOSIS — R7303 Prediabetes: Secondary | ICD-10-CM

## 2019-04-20 DIAGNOSIS — E6609 Other obesity due to excess calories: Secondary | ICD-10-CM

## 2019-04-20 DIAGNOSIS — Z0001 Encounter for general adult medical examination with abnormal findings: Secondary | ICD-10-CM

## 2019-04-20 DIAGNOSIS — Z1329 Encounter for screening for other suspected endocrine disorder: Secondary | ICD-10-CM | POA: Diagnosis not present

## 2019-04-20 DIAGNOSIS — Z79899 Other long term (current) drug therapy: Secondary | ICD-10-CM | POA: Diagnosis not present

## 2019-04-20 DIAGNOSIS — E782 Mixed hyperlipidemia: Secondary | ICD-10-CM

## 2019-04-20 DIAGNOSIS — R5383 Other fatigue: Secondary | ICD-10-CM

## 2019-04-20 MED ORDER — CLARITIN-D 24 HOUR 10-240 MG PO TB24: ORAL_TABLET | ORAL | 3 refills | Status: DC

## 2019-04-20 NOTE — Patient Instructions (Signed)
Vit D  & Vit C 1,000 mg   are recommended to help protect  against the Covid-19 and other Corona viruses.    Also it's recommended  to take  Zinc 50 mg  to help  protect against the Covid-19   and best place to get  is also on [employer]  and don't pay more than 6-8 cents /pill !  =============================== Coronavirus (COVID-19) Are you at risk?  Are you at risk for the Coronavirus (COVID-19)?  To be considered HIGH RISK for Coronavirus (COVID-19), you have to meet the following criteria:  . Traveled to [place], [place], [place], [place] or [place]; or in the [place] to [place], [place], [place]  . or [place]; and have fever, cough, and shortness of breath within the last 2 weeks of travel OR . Been in close contact with a person diagnosed with COVID-19 within the last 2 weeks and have  . fever, cough,and shortness of breath .  . IF YOU DO NOT MEET THESE CRITERIA, YOU ARE CONSIDERED LOW RISK FOR COVID-19.  What to do if you are HIGH RISK for COVID-19?  . If you are having a medical emergency, call 911. . Seek medical care right away. Before you go to a doctor's office, urgent care or emergency department, .  call ahead and tell them about your recent travel, contact with someone diagnosed with COVID-19  .  and your symptoms.  . You should receive instructions from your physician's office regarding next steps of care.  . When you arrive at healthcare provider, tell the healthcare staff immediately you have returned from  . visiting [place], [place], [place], [place] or [place]; or traveled in the [place] to [place], [place],  . [place] or [place] in the last two weeks or you have been in close contact with a person diagnosed with  . COVID-19 in the last 2 weeks.   . Tell the health care staff about your symptoms: fever, cough and shortness of breath. . After you have been seen by a medical provider, you will be either: o Tested for (COVID-19) and  discharged home on quarantine except to seek medical care if  o symptoms worsen, and asked to  - Stay home and avoid contact with others until you get your results (4-5 days)  - Avoid travel on public transportation if possible (such as bus, train, or airplane) or o Sent to the Emergency Department by EMS for evaluation, COVID-19 testing  and  o possible admission depending on your condition and test results.  What to do if you are LOW RISK for COVID-19?  Reduce your risk of any infection by using the same precautions used for avoiding the common cold or flu:  . Wash your hands often with soap and warm water for at least 20 seconds.  If soap and water are not readily available,  . use an alcohol-based hand sanitizer with at least 60% alcohol.  . If coughing or sneezing, cover your mouth and nose by coughing or sneezing into the elbow areas of your shirt or coat, .  into a tissue or into your sleeve (not your hands). . Avoid shaking hands with others and consider head nods or verbal greetings only. . Avoid touching your eyes, nose, or mouth with unwashed hands.  . Avoid close contact with people who are sick. . Avoid places or events with large numbers of people in one location, like concerts or sporting events. .   Carefully consider travel plans you have or are making. . If you are planning any travel outside or inside the [place], visit the CDC's Travelers' Health webpage for the latest health notices. . If you have some symptoms but not all symptoms, continue to monitor at home and seek medical attention  . if your symptoms worsen. . If you are having a medical emergency, call 911. >>>>>>>>>>>>>>>>>>>>>>>>>>>> Preventive Care for Adults  A healthy lifestyle and preventive care can promote health and wellness. Preventive health guidelines for men include the following key practices:  A routine yearly physical is a good way to check with your health care provider about your health and  preventative screening. It is a chance to share any concerns and updates on your health and to receive a thorough exam.  Visit your dentist for a routine exam and preventative care every 6 months. Brush your teeth twice a day and floss once a day. Good oral hygiene prevents tooth decay and gum disease.  The frequency of eye exams is based on your age, health, family medical history, use of contact lenses, and other factors. Follow your health care provider's recommendations for frequency of eye exams.  Eat a healthy diet. Foods such as vegetables, fruits, whole grains, low-fat dairy products, and lean protein foods contain the nutrients you need without too many calories. Decrease your intake of foods high in solid fats, added sugars, and salt. Eat the right amount of calories for you. Get information about a proper diet from your health care provider, if necessary.  Regular physical exercise is one of the most important things you can do for your health. Most adults should get at least 150 minutes of moderate-intensity exercise (any activity that increases your heart rate and causes you to sweat) each week. In addition, most adults need muscle-strengthening exercises on 2 or more days a week.  Maintain a healthy weight. The body mass index (BMI) is a screening tool to identify possible weight problems. It provides an estimate of body fat based on height and weight. Your health care provider can find your BMI and can help you achieve or maintain a healthy weight. For adults 20 years and older:  A BMI below 18.5 is considered underweight.  A BMI of 18.5 to 24.9 is normal.  A BMI of 25 to 29.9 is considered overweight.  A BMI of 30 and above is considered obese.  Maintain normal blood lipids and cholesterol levels by exercising and minimizing your intake of saturated fat. Eat a balanced diet with plenty of fruit and vegetables. Blood tests for lipids and cholesterol should begin at age 20 and be  repeated every 5 years. If your lipid or cholesterol levels are high, you are over 50, or you are at high risk for heart disease, you may need your cholesterol levels checked more frequently. Ongoing high lipid and cholesterol levels should be treated with medicines if diet and exercise are not working.  If you smoke, find out from your health care provider how to quit. If you do not use tobacco, do not start.  Lung cancer screening is recommended for adults aged 55-80 years who are at high risk for developing lung cancer because of a history of smoking. A yearly low-dose CT scan of the lungs is recommended for people who have at least a 30-pack-year history of smoking and are a current smoker or have quit within the past 15 years. A pack year of smoking is smoking an average of 1   pack of cigarettes a day for 1 year (for example: 1 pack a day for 30 years or 2 packs a day for 15 years). Yearly screening should continue until the smoker has stopped smoking for at least 15 years. Yearly screening should be stopped for people who develop a health problem that would prevent them from having lung cancer treatment.  If you choose to drink alcohol, do not have more than 2 drinks per day. One drink is considered to be 12 ounces (355 mL) of beer, 5 ounces (148 mL) of wine, or 1.5 ounces (44 mL) of liquor.  Avoid use of street drugs. Do not share needles with anyone. Ask for help if you need support or instructions about stopping the use of drugs.  High blood pressure causes heart disease and increases the risk of stroke. Your blood pressure should be checked at least every 1-2 years. Ongoing high blood pressure should be treated with medicines, if weight loss and exercise are not effective.  If you are 45-79 years old, ask your health care provider if you should take aspirin to prevent heart disease.  Diabetes screening involves taking a blood sample to check your fasting blood sugar level. This should be done  once every 3 years, after age 45, if you are within normal weight and without risk factors for diabetes. Testing should be considered at a younger age or be carried out more frequently if you are overweight and have at least 1 risk factor for diabetes.  Colorectal cancer can be detected and often prevented. Most routine colorectal cancer screening begins at the age of 50 and continues through age 75. However, your health care provider may recommend screening at an earlier age if you have risk factors for colon cancer. On a yearly basis, your health care provider may provide home test kits to check for hidden blood in the stool. Use of a small camera at the end of a tube to directly examine the colon (sigmoidoscopy or colonoscopy) can detect the earliest forms of colorectal cancer. Talk to your health care provider about this at age 50, when routine screening begins. Direct exam of the colon should be repeated every 5-10 years through age 75, unless early forms of precancerous polyps or small growths are found.   Talk with your health care provider about prostate cancer screening.  Testicular cancer screening isrecommended for adult males. Screening includes self-exam, a health care provider exam, and other screening tests. Consult with your health care provider about any symptoms you have or any concerns you have about testicular cancer.  Use sunscreen. Apply sunscreen liberally and repeatedly throughout the day. You should seek shade when your shadow is shorter than you. Protect yourself by wearing long sleeves, pants, a wide-brimmed hat, and sunglasses year round, whenever you are outdoors.  Once a month, do a whole-body skin exam, using a mirror to look at the skin on your back. Tell your health care provider about new moles, moles that have irregular borders, moles that are larger than a pencil eraser, or moles that have changed in shape or color.  Stay current with required vaccines  (immunizations).  Influenza vaccine. All adults should be immunized every year.  Tetanus, diphtheria, and acellular pertussis (Td, Tdap) vaccine. An adult who has not previously received Tdap or who does not know his vaccine status should receive 1 dose of Tdap. This initial dose should be followed by tetanus and diphtheria toxoids (Td) booster doses every 10 years. Adults with an   unknown or incomplete history of completing a 3-dose immunization series with Td-containing vaccines should begin or complete a primary immunization series including a Tdap dose. Adults should receive a Td booster every 10 years.  Varicella vaccine. An adult without evidence of immunity to varicella should receive 2 doses or a second dose if he has previously received 1 dose.  Human papillomavirus (HPV) vaccine. Males aged 13-21 years who have not received the vaccine previously should receive the 3-dose series. Males aged 22-26 years may be immunized. Immunization is recommended through the age of 26 years for any male who has sex with males and did not get any or all doses earlier. Immunization is recommended for any person with an immunocompromised condition through the age of 26 years if he did not get any or all doses earlier. During the 3-dose series, the second dose should be obtained 4-8 weeks after the first dose. The third dose should be obtained 24 weeks after the first dose and 16 weeks after the second dose.  Zoster vaccine. One dose is recommended for adults aged 60 years or older unless certain conditions are present.    PREVNAR  - Pneumococcal 13-valent conjugate (PCV13) vaccine. When indicated, a person who is uncertain of his immunization history and has no record of immunization should receive the PCV13 vaccine. An adult aged 19 years or older who has certain medical conditions and has not been previously immunized should receive 1 dose of PCV13 vaccine. This PCV13 should be followed with a dose of  pneumococcal polysaccharide (PPSV23) vaccine. The PPSV23 vaccine dose should be obtained at least 1 r more year(s) after the dose of PCV13 vaccine. An adult aged 19 years or older who has certain medical conditions and previously received 1 or more doses of PPSV23 vaccine should receive 1 dose of PCV13. The PCV13 vaccine dose should be obtained 1 or more years after the last PPSV23 vaccine dose.    PNEUMOVAX - Pneumococcal polysaccharide (PPSV23) vaccine. When PCV13 is also indicated, PCV13 should be obtained first. All adults aged 65 years and older should be immunized. An adult younger than age 65 years who has certain medical conditions should be immunized. Any person who resides in a nursing home or long-term care facility should be immunized. An adult smoker should be immunized. People with an immunocompromised condition and certain other conditions should receive both PCV13 and PPSV23 vaccines. People with human immunodeficiency virus (HIV) infection should be immunized as soon as possible after diagnosis. Immunization during chemotherapy or radiation therapy should be avoided. Routine use of PPSV23 vaccine is not recommended for American Indians, [place] Natives, or people younger than 65 years unless there are medical conditions that require PPSV23 vaccine. When indicated, people who have unknown immunization and have no record of immunization should receive PPSV23 vaccine. One-time revaccination 5 years after the first dose of PPSV23 is recommended for people aged 19-64 years who have chronic kidney failure, nephrotic syndrome, asplenia, or immunocompromised conditions. People who received 1-2 doses of PPSV23 before age 65 years should receive another dose of PPSV23 vaccine at age 65 years or later if at least 5 years have passed since the previous dose. Doses of PPSV23 are not needed for people immunized with PPSV23 at or after age 65 years.    Hepatitis A vaccine. Adults who wish to be protected  from this disease, have certain high-risk conditions, work with hepatitis A-infected animals, work in hepatitis A research labs, or travel to or work in countries with   a high rate of hepatitis A should be immunized. Adults who were previously unvaccinated and who anticipate close contact with an international adoptee during the first 60 days after arrival in the [place] from a country with a high rate of hepatitis A should be immunized.    Hepatitis B vaccine. Adults should be immunized if they wish to be protected from this disease, have certain high-risk conditions, may be exposed to blood or other infectious body fluids, are household contacts or sex partners of hepatitis B positive people, are clients or workers in certain care facilities, or travel to or work in countries with a high rate of hepatitis B.   Preventive Service / Frequency   Ages 40 to 64  Blood pressure check.  Lipid and cholesterol check  Lung cancer screening. / Every year if you are aged 55-80 years and have a 30-pack-year history of smoking and currently smoke or have quit within the past 15 years. Yearly screening is stopped once you have quit smoking for at least 15 years or develop a health problem that would prevent you from having lung cancer treatment.  Fecal occult blood test (FOBT) of stool. / Every year beginning at age 50 and continuing until age 75. You may not have to do this test if you get a colonoscopy every 10 years.  Flexible sigmoidoscopy** or colonoscopy.** / Every 5 years for a flexible sigmoidoscopy or every 10 years for a colonoscopy beginning at age 50 and continuing until age 75. Screening for abdominal aortic aneurysm (AAA)  by ultrasound is recommended for people who have history of high blood pressure or who are current or former smokers. +++++++++++ Recommend Adult Low Dose Aspirin or  coated  Aspirin 81 mg daily  To reduce risk of Colon Cancer 40 %,  Skin Cancer 26 % ,  Malignant  Melanoma 46%  and  Pancreatic cancer 60% ++++++++++++++++++++ Vitamin D goal  is between 70-100.  Please make sure that you are taking your Vitamin D as directed.  It is very important as a natural anti-inflammatory  helping hair, skin, and nails, as well as reducing stroke and heart attack risk.  It helps your bones and helps with mood. It also decreases numerous cancer risks so please take it as directed.  Low Vit D is associated with a 200-300% higher risk for CANCER  and 200-300% higher risk for HEART   ATTACK  &  STROKE.   ...................................... It is also associated with higher death rate at younger ages,  autoimmune diseases like Rheumatoid arthritis, Lupus, Multiple Sclerosis.    Also many other serious conditions, like depression, Alzheimer's Dementia, infertility, muscle aches, fatigue, fibromyalgia - just to name a few. +++++++++++++++++++++ Recommend the book "The END of DIETING" by Dr Sukhraj Fuhrman  & the book "The END of DIABETES " by Dr Jayshaun Fuhrman At [employer] - get book & Audio CD's    Being diabetic has a  300% increased risk for heart attack, stroke, cancer, and alzheimer- type vascular dementia. It is very important that you work harder with diet by avoiding all foods that are white. Avoid white rice (brown & wild rice is OK), white potatoes (sweetpotatoes in moderation is OK), White bread or wheat bread or anything made out of white flour like bagels, donuts, rolls, buns, biscuits, cakes, pastries, cookies, pizza crust, and pasta (made from white flour & egg whites) - vegetarian pasta or spinach or wheat pasta is OK. Multigrain breads like Arnold's or [employer],   or multigrain sandwich thins or flatbreads.  Diet, exercise and weight loss can reverse and cure diabetes in the early stages.  Diet, exercise and weight loss is very important in the control and prevention of complications of diabetes which affects every system in your body, ie. Brain -  dementia/stroke, eyes - glaucoma/blindness, heart - heart attack/heart failure, kidneys - dialysis, stomach - gastric paralysis, intestines - malabsorption, nerves - severe painful neuritis, circulation - gangrene & loss of a leg(s), and finally cancer and Alzheimers.    I recommend avoid fried & greasy foods,  sweets/candy, white rice (brown or wild rice or Quinoa is OK), white potatoes (sweet potatoes are OK) - anything made from white flour - bagels, doughnuts, rolls, buns, biscuits,white and wheat breads, pizza crust and traditional pasta made of white flour & egg white(vegetarian pasta or spinach or wheat pasta is OK).  Multi-grain bread is OK - like multi-grain flat bread or sandwich thins. Avoid alcohol in excess. Exercise is also important.    Eat all the vegetables you want - avoid meat, especially red meat and dairy - especially cheese.  Cheese is the most concentrated form of trans-fats which is the worst thing to clog up our arteries. Veggie cheese is OK which can be found in the fresh produce section at [employer] or [employer] or [employer]  ++++++++++++++++++++++ DASH Eating Plan  DASH stands for "Dietary Approaches to Stop Hypertension."   The DASH eating plan is a healthy eating plan that has been shown to reduce high blood pressure (hypertension). Additional health benefits may include reducing the risk of type 2 diabetes mellitus, heart disease, and stroke. The DASH eating plan may also help with weight loss. WHAT DO I NEED TO KNOW ABOUT THE DASH EATING PLAN? For the DASH eating plan, you will follow these general guidelines:  Choose foods with a percent daily value for sodium of less than 5% (as listed on the food label).  Use salt-free seasonings or herbs instead of table salt or sea salt.  Check with your health care provider or pharmacist before using salt substitutes.  Eat lower-sodium products, often labeled as "lower sodium" or "no salt added."  Eat fresh  foods.  Eat more vegetables, fruits, and low-fat dairy products.  Choose whole grains. Look for the word "whole" as the first word in the ingredient list.  Choose fish   Limit sweets, desserts, sugars, and sugary drinks.  Choose heart-healthy fats.  Eat veggie cheese   Eat more home-cooked food and less restaurant, buffet, and fast food.  Limit fried foods.  Cook foods using methods other than frying.  Limit canned vegetables. If you do use them, rinse them well to decrease the sodium.  When eating at a restaurant, ask that your food be prepared with less salt, or no salt if possible.                      WHAT FOODS CAN I EAT? Read Dr Leor Fuhrman's books on The End of Dieting & The End of Diabetes  Grains Whole grain or whole wheat bread. Brown rice. Whole grain or whole wheat pasta. Quinoa, bulgur, and whole grain cereals. Low-sodium cereals. Corn or whole wheat flour tortillas. Whole grain cornbread. Whole grain crackers. Low-sodium crackers.  Vegetables Fresh or frozen vegetables (raw, steamed, roasted, or grilled). Low-sodium or reduced-sodium tomato and vegetable juices. Low-sodium or reduced-sodium tomato sauce and paste. Low-sodium or reduced-sodium canned vegetables.   Fruits All fresh, canned (  in natural juice), or frozen fruits.  Protein Products  All fish and seafood.  Dried beans, peas, or lentils. Unsalted nuts and seeds. Unsalted canned beans.  Dairy Low-fat dairy products, such as skim or 1% milk, 2% or reduced-fat cheeses, low-fat ricotta or cottage cheese, or plain low-fat yogurt. Low-sodium or reduced-sodium cheeses.  Fats and Oils Tub margarines without trans fats. Light or reduced-fat mayonnaise and salad dressings (reduced sodium). Avocado. Safflower, olive, or canola oils. Natural peanut or almond butter.  Other Unsalted popcorn and pretzels. The items listed above may not be a complete list of recommended foods or beverages. Contact your  dietitian for more options.  +++++++++++++++++++  WHAT FOODS ARE NOT RECOMMENDED? Grains/ White flour or wheat flour White bread. White pasta. White rice. Refined cornbread. Bagels and croissants. Crackers that contain trans fat.  Vegetables  Creamed or fried vegetables. Vegetables in a . Regular canned vegetables. Regular canned tomato sauce and paste. Regular tomato and vegetable juices.  Fruits Dried fruits. Canned fruit in light or heavy syrup. Fruit juice.  Meat and Other Protein Products Meat in general - RED meat & White meat.  Fatty cuts of meat. Ribs, chicken wings, all processed meats as bacon, sausage, bologna, salami, fatback, hot dogs, bratwurst and packaged luncheon meats.  Dairy Whole or 2% milk, cream, half-and-half, and cream cheese. Whole-fat or sweetened yogurt. Full-fat cheeses or blue cheese. Non-dairy creamers and whipped toppings. Processed cheese, cheese spreads, or cheese curds.  Condiments Onion and garlic salt, seasoned salt, table salt, and sea salt. Canned and packaged gravies. Worcestershire sauce. Tartar sauce. Barbecue sauce. Teriyaki sauce. Soy sauce, including reduced sodium. Steak sauce. Fish sauce. Oyster sauce. Cocktail sauce. Horseradish. Ketchup and mustard. Meat flavorings and tenderizers. Bouillon cubes. Hot sauce. Tabasco sauce. Marinades. Taco seasonings. Relishes.  Fats and Oils Butter, stick margarine, lard, shortening and bacon fat. Coconut, palm kernel, or palm oils. Regular salad dressings.  Pickles and olives. Salted popcorn and pretzels.  The items listed above may not be a complete list of foods and beverages to avoid.    
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
## 2019-04-21 ENCOUNTER — Encounter: Payer: Self-pay | Admitting: Internal Medicine

## 2019-04-21 LAB — MICROALBUMIN / CREATININE URINE RATIO
Creatinine, Urine: 44 mg/dL (ref 20–320)
Microalb Creat Ratio: 23 mcg/mg creat (ref <30)
Microalb, Ur: 1 mg/dL

## 2019-04-21 LAB — TESTOSTERONE: Testosterone: 1029 ng/dL — ABNORMAL HIGH (ref 250–827)

## 2019-04-21 LAB — IRON, TOTAL/TOTAL IRON BINDING CAP
%SAT: 27 % (calc) (ref 20–48)
Iron: 89 ug/dL (ref 50–180)
TIBC: 332 mcg/dL (calc) (ref 250–425)

## 2019-04-21 LAB — COMPLETE METABOLIC PANEL WITH GFR
AG Ratio: 2.5 (calc) (ref 1.0–2.5)
ALT: 63 U/L — ABNORMAL HIGH (ref 9–46)
AST: 114 U/L — ABNORMAL HIGH (ref 10–35)
Albumin: 4.9 g/dL (ref 3.6–5.1)
Alkaline phosphatase (APISO): 47 U/L (ref 35–144)
BUN: 21 mg/dL (ref 7–25)
CO2: 26 mmol/L (ref 20–32)
Calcium: 9.8 mg/dL (ref 8.6–10.3)
Chloride: 103 mmol/L (ref 98–110)
Creat: 0.97 mg/dL (ref 0.70–1.25)
GFR, Est African American: 97 mL/min/{1.73_m2} (ref >=60)
GFR, Est Non African American: 84 mL/min/{1.73_m2} (ref >=60)
Globulin: 2 g/dL (calc) (ref 1.9–3.7)
Glucose, Bld: 88 mg/dL (ref 65–99)
Potassium: 4.3 mmol/L (ref 3.5–5.3)
Sodium: 139 mmol/L (ref 135–146)
Total Bilirubin: 0.8 mg/dL (ref 0.2–1.2)
Total Protein: 6.9 g/dL (ref 6.1–8.1)

## 2019-04-21 LAB — HEMOGLOBIN A1C
Hgb A1c MFr Bld: 5.2 % of total Hgb (ref <5.7)
Mean Plasma Glucose: 103 (calc)
eAG (mmol/L): 5.7 (calc)

## 2019-04-21 LAB — CBC WITH DIFFERENTIAL/PLATELET
Absolute Monocytes: 718 cells/uL (ref 200–950)
Basophils Absolute: 82 cells/uL (ref 0–200)
Basophils Relative: 1.3 %
Eosinophils Absolute: 69 cells/uL (ref 15–500)
Eosinophils Relative: 1.1 %
HCT: 46.1 % (ref 38.5–50.0)
Hemoglobin: 15.7 g/dL (ref 13.2–17.1)
Lymphs Abs: 1260 cells/uL (ref 850–3900)
MCH: 32.2 pg (ref 27.0–33.0)
MCHC: 34.1 g/dL (ref 32.0–36.0)
MCV: 94.5 fL (ref 80.0–100.0)
MPV: 11.5 fL (ref 7.5–12.5)
Monocytes Relative: 11.4 %
Neutro Abs: 4171 cells/uL (ref 1500–7800)
Neutrophils Relative %: 66.2 %
Platelets: 178 10*3/uL (ref 140–400)
RBC: 4.88 10*6/uL (ref 4.20–5.80)
RDW: 11.8 % (ref 11.0–15.0)
Total Lymphocyte: 20 %
WBC: 6.3 10*3/uL (ref 3.8–10.8)

## 2019-04-21 LAB — TSH: TSH: 1.32 mIU/L (ref 0.40–4.50)

## 2019-04-21 LAB — URINALYSIS, ROUTINE W REFLEX MICROSCOPIC
Bilirubin Urine: NEGATIVE
Glucose, UA: NEGATIVE
Hgb urine dipstick: NEGATIVE
Ketones, ur: NEGATIVE
Leukocytes,Ua: NEGATIVE
Nitrite: NEGATIVE
Protein, ur: NEGATIVE
Specific Gravity, Urine: 1.011 (ref 1.001–1.03)
pH: 5.5 (ref 5.0–8.0)

## 2019-04-21 LAB — LIPID PANEL
Cholesterol: 113 mg/dL (ref <200)
HDL: 55 mg/dL (ref >=40)
LDL Cholesterol (Calc): 42 mg/dL (calc)
Non-HDL Cholesterol (Calc): 58 mg/dL (calc) (ref <130)
Total CHOL/HDL Ratio: 2.1 (calc) (ref <5.0)
Triglycerides: 81 mg/dL (ref <150)

## 2019-04-21 LAB — VITAMIN B12: Vitamin B-12: 642 pg/mL (ref 200–1100)

## 2019-04-21 LAB — INSULIN, RANDOM: Insulin: 4 u[IU]/mL

## 2019-04-21 LAB — VITAMIN D 25 HYDROXY (VIT D DEFICIENCY, FRACTURES): Vit D, 25-Hydroxy: 74 ng/mL (ref 30–100)

## 2019-04-21 LAB — PSA: PSA: 1.8 ng/mL (ref <=4.0)

## 2019-04-21 LAB — MAGNESIUM: Magnesium: 2.1 mg/dL (ref 1.5–2.5)

## 2019-04-24 ENCOUNTER — Ambulatory Visit: Payer: BC Managed Care – PPO | Attending: Internal Medicine

## 2019-04-24 DIAGNOSIS — Z23 Encounter for immunization: Secondary | ICD-10-CM

## 2019-04-24 NOTE — Progress Notes (Signed)
   Covid-19 Vaccination Clinic  Name:  [name]    MRN: [id number] DOB: 31-Jul-1960  04/24/2019  [name] was observed post Covid-19 immunization for 15 minutes without incidence. He was provided with Vaccine Information Sheet and instruction to access the V-Safe system.   [name] was instructed to call 911 with any severe reactions post vaccine: Marland Kitchen Difficulty breathing  . Swelling of your face and throat  . A fast heartbeat  . A bad rash all over your body  . Dizziness and weakness    Immunizations Administered    Name Date Dose VIS Date Route   Pfizer COVID-19 Vaccine 04/24/2019  9:13 AM 0.3 mL 02/11/2019 Intramuscular   Manufacturer: [place]   Lot: [id number]   [place]: [id number]

## 2019-05-09 ENCOUNTER — Other Ambulatory Visit: Payer: Self-pay

## 2019-05-09 DIAGNOSIS — Z1211 Encounter for screening for malignant neoplasm of colon: Secondary | ICD-10-CM

## 2019-05-09 DIAGNOSIS — Z1212 Encounter for screening for malignant neoplasm of rectum: Secondary | ICD-10-CM | POA: Diagnosis not present

## 2019-05-09 LAB — POC HEMOCCULT BLD/STL (HOME/3-CARD/SCREEN)
Card #2 Fecal Occult Blod, POC: NEGATIVE
Card #3 Fecal Occult Blood, POC: NEGATIVE
Fecal Occult Blood, POC: NEGATIVE

## 2019-05-10 ENCOUNTER — Other Ambulatory Visit: Payer: Self-pay | Admitting: Internal Medicine

## 2019-05-10 DIAGNOSIS — F411 Generalized anxiety disorder: Secondary | ICD-10-CM

## 2019-05-10 LAB — TB SKIN TEST
Induration: 0 mm
TB Skin Test: NEGATIVE

## 2019-05-17 ENCOUNTER — Ambulatory Visit: Payer: BC Managed Care – PPO | Attending: Internal Medicine

## 2019-05-17 ENCOUNTER — Ambulatory Visit: Payer: BC Managed Care – PPO

## 2019-05-17 DIAGNOSIS — Z23 Encounter for immunization: Secondary | ICD-10-CM

## 2019-05-17 NOTE — Progress Notes (Signed)
   Covid-19 Vaccination Clinic  Name:  [name]    MRN: [id number] DOB: [birth year]  05/17/2019  [name] was observed post Covid-19 immunization for 15 minutes without incident. He was provided with Vaccine Information Sheet and instruction to access the V-Safe system.   [name] was instructed to call 911 with any severe reactions post vaccine: Marland Kitchen Difficulty breathing  . Swelling of face and throat  . A fast heartbeat  . A bad rash all over body  . Dizziness and weakness   Immunizations Administered    Name Date Dose VIS Date Route   Pfizer COVID-19 Vaccine 05/17/2019  2:18 PM 0.3 mL 02/11/2019 Intramuscular   Manufacturer: [place]   Lot: [id number]   [place]: [id number]

## 2019-06-29 ENCOUNTER — Other Ambulatory Visit: Payer: Self-pay | Admitting: Internal Medicine

## 2019-06-29 DIAGNOSIS — N401 Enlarged prostate with lower urinary tract symptoms: Secondary | ICD-10-CM

## 2019-06-29 DIAGNOSIS — N138 Other obstructive and reflux uropathy: Secondary | ICD-10-CM

## 2019-07-07 ENCOUNTER — Other Ambulatory Visit: Payer: Self-pay | Admitting: Internal Medicine

## 2019-07-07 DIAGNOSIS — N138 Other obstructive and reflux uropathy: Secondary | ICD-10-CM

## 2019-07-07 DIAGNOSIS — N401 Enlarged prostate with lower urinary tract symptoms: Secondary | ICD-10-CM

## 2019-07-25 ENCOUNTER — Other Ambulatory Visit: Payer: Self-pay | Admitting: Internal Medicine

## 2019-08-03 ENCOUNTER — Ambulatory Visit: Payer: BC Managed Care – PPO | Admitting: Adult Health

## 2019-08-04 DIAGNOSIS — H16041 Marginal corneal ulcer, right eye: Secondary | ICD-10-CM | POA: Diagnosis not present

## 2019-08-25 DIAGNOSIS — L578 Other skin changes due to chronic exposure to nonionizing radiation: Secondary | ICD-10-CM | POA: Diagnosis not present

## 2019-08-25 DIAGNOSIS — L821 Other seborrheic keratosis: Secondary | ICD-10-CM | POA: Diagnosis not present

## 2019-08-25 DIAGNOSIS — D225 Melanocytic nevi of trunk: Secondary | ICD-10-CM | POA: Diagnosis not present

## 2019-08-25 DIAGNOSIS — C44612 Basal cell carcinoma of skin of right upper limb, including shoulder: Secondary | ICD-10-CM | POA: Diagnosis not present

## 2019-08-25 DIAGNOSIS — L570 Actinic keratosis: Secondary | ICD-10-CM | POA: Diagnosis not present

## 2019-08-25 DIAGNOSIS — D485 Neoplasm of uncertain behavior of skin: Secondary | ICD-10-CM | POA: Diagnosis not present

## 2019-09-29 ENCOUNTER — Ambulatory Visit: Payer: Self-pay

## 2019-09-29 ENCOUNTER — Ambulatory Visit: Payer: BC Managed Care – PPO | Admitting: Orthopedic Surgery

## 2019-09-29 ENCOUNTER — Encounter: Payer: Self-pay | Admitting: Orthopedic Surgery

## 2019-09-29 DIAGNOSIS — M542 Cervicalgia: Secondary | ICD-10-CM | POA: Diagnosis not present

## 2019-09-29 DIAGNOSIS — M79602 Pain in left arm: Secondary | ICD-10-CM | POA: Diagnosis not present

## 2019-09-29 NOTE — Addendum Note (Signed)
Addended byLaurann Montana on: 09/29/2019 11:01 AM   Modules accepted: Orders
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
## 2019-10-12 ENCOUNTER — Other Ambulatory Visit: Payer: Self-pay

## 2019-10-12 ENCOUNTER — Ambulatory Visit
Admission: RE | Admit: 2019-10-12 | Discharge: 2019-10-12 | Disposition: A | Discharge status: Home / Self Care | Payer: BC Managed Care – PPO | Source: Ambulatory Visit | Attending: Orthopedic Surgery | Admitting: Orthopedic Surgery

## 2019-10-12 DIAGNOSIS — M79602 Pain in left arm: Secondary | ICD-10-CM

## 2019-10-12 DIAGNOSIS — S43432A Superior glenoid labrum lesion of left shoulder, initial encounter: Secondary | ICD-10-CM | POA: Diagnosis not present

## 2019-10-12 DIAGNOSIS — M25612 Stiffness of left shoulder, not elsewhere classified: Secondary | ICD-10-CM | POA: Diagnosis not present

## 2019-10-12 MED ORDER — IOPAMIDOL (ISOVUE-M 200) INJECTION 41%
13.0000 mL | Freq: Once | INTRAMUSCULAR | Status: AC
  Administered 2019-10-12: 13 mL via INTRA_ARTICULAR

## 2019-10-21 ENCOUNTER — Ambulatory Visit: Payer: BC Managed Care – PPO | Admitting: Surgical

## 2019-10-25 ENCOUNTER — Encounter: Payer: Self-pay | Admitting: Surgical

## 2019-10-25 ENCOUNTER — Other Ambulatory Visit: Payer: Self-pay

## 2019-10-25 ENCOUNTER — Ambulatory Visit: Payer: BC Managed Care – PPO | Admitting: Surgical

## 2019-10-25 DIAGNOSIS — L818 Other specified disorders of pigmentation: Secondary | ICD-10-CM | POA: Diagnosis not present

## 2019-10-25 DIAGNOSIS — M75102 Unspecified rotator cuff tear or rupture of left shoulder, not specified as traumatic: Secondary | ICD-10-CM | POA: Diagnosis not present

## 2019-10-25 DIAGNOSIS — C44612 Basal cell carcinoma of skin of right upper limb, including shoulder: Secondary | ICD-10-CM | POA: Diagnosis not present

## 2019-10-30 ENCOUNTER — Encounter: Payer: Self-pay | Admitting: Surgical

## 2019-10-30 MED ORDER — METHYLPREDNISOLONE ACETATE 40 MG/ML IJ SUSP
40.0000 mg | INTRAMUSCULAR | Status: AC | PRN
  Administered 2019-10-30: 40 mg via INTRA_ARTICULAR

## 2019-10-30 MED ORDER — LIDOCAINE HCL 1 % IJ SOLN
5.0000 mL | INTRAMUSCULAR | Status: AC | PRN
  Administered 2019-10-30: 5 mL

## 2019-10-30 MED ORDER — BUPIVACAINE HCL 0.5 % IJ SOLN
9.0000 mL | INTRAMUSCULAR | Status: AC | PRN
  Administered 2019-10-30: 9 mL via INTRA_ARTICULAR
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
## 2019-11-04 ENCOUNTER — Other Ambulatory Visit: Payer: Self-pay | Admitting: Radiology

## 2019-11-04 DIAGNOSIS — M79602 Pain in left arm: Secondary | ICD-10-CM

## 2019-11-04 DIAGNOSIS — M75102 Unspecified rotator cuff tear or rupture of left shoulder, not specified as traumatic: Secondary | ICD-10-CM

## 2019-11-07 ENCOUNTER — Encounter: Payer: Self-pay | Admitting: Internal Medicine

## 2019-11-07 NOTE — Patient Instructions (Signed)
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
## 2019-11-08 ENCOUNTER — Ambulatory Visit (INDEPENDENT_AMBULATORY_CARE_PROVIDER_SITE_OTHER): Payer: BC Managed Care – PPO | Admitting: Internal Medicine

## 2019-11-08 ENCOUNTER — Encounter: Payer: Self-pay | Admitting: Internal Medicine

## 2019-11-08 ENCOUNTER — Other Ambulatory Visit: Payer: Self-pay

## 2019-11-08 VITALS — BP 140/80 | HR 73 | Temp 97.7°F | Ht 65.0 in | Wt 190.0 lb

## 2019-11-08 DIAGNOSIS — R7309 Other abnormal glucose: Secondary | ICD-10-CM

## 2019-11-08 DIAGNOSIS — E559 Vitamin D deficiency, unspecified: Secondary | ICD-10-CM

## 2019-11-08 DIAGNOSIS — Z136 Encounter for screening for cardiovascular disorders: Secondary | ICD-10-CM

## 2019-11-08 DIAGNOSIS — Z79899 Other long term (current) drug therapy: Secondary | ICD-10-CM

## 2019-11-08 DIAGNOSIS — I10 Essential (primary) hypertension: Secondary | ICD-10-CM | POA: Diagnosis not present

## 2019-11-08 DIAGNOSIS — E782 Mixed hyperlipidemia: Secondary | ICD-10-CM | POA: Diagnosis not present

## 2019-11-08 DIAGNOSIS — E349 Endocrine disorder, unspecified: Secondary | ICD-10-CM

## 2019-11-08 DIAGNOSIS — R002 Palpitations: Secondary | ICD-10-CM

## 2019-11-09 LAB — CBC WITH DIFFERENTIAL/PLATELET
Absolute Monocytes: 637 cells/uL (ref 200–950)
Basophils Absolute: 67 cells/uL (ref 0–200)
Basophils Relative: 1 %
Eosinophils Absolute: 67 cells/uL (ref 15–500)
Eosinophils Relative: 1 %
HCT: 46.3 % (ref 38.5–50.0)
Hemoglobin: 15.8 g/dL (ref 13.2–17.1)
Lymphs Abs: 1159 cells/uL (ref 850–3900)
MCH: 32.8 pg (ref 27.0–33.0)
MCHC: 34.1 g/dL (ref 32.0–36.0)
MCV: 96.3 fL (ref 80.0–100.0)
MPV: 11.5 fL (ref 7.5–12.5)
Monocytes Relative: 9.5 %
Neutro Abs: 4770 cells/uL (ref 1500–7800)
Neutrophils Relative %: 71.2 %
Platelets: 182 10*3/uL (ref 140–400)
RBC: 4.81 10*6/uL (ref 4.20–5.80)
RDW: 12 % (ref 11.0–15.0)
Total Lymphocyte: 17.3 %
WBC: 6.7 10*3/uL (ref 3.8–10.8)

## 2019-11-09 LAB — COMPLETE METABOLIC PANEL WITH GFR
AG Ratio: 2 (calc) (ref 1.0–2.5)
ALT: 29 U/L (ref 9–46)
AST: 26 U/L (ref 10–35)
Albumin: 4.7 g/dL (ref 3.6–5.1)
Alkaline phosphatase (APISO): 48 U/L (ref 35–144)
BUN: 23 mg/dL (ref 7–25)
CO2: 25 mmol/L (ref 20–32)
Calcium: 10.2 mg/dL (ref 8.6–10.3)
Chloride: 103 mmol/L (ref 98–110)
Creat: 0.9 mg/dL (ref 0.70–1.25)
GFR, Est African American: 106 mL/min/{1.73_m2} (ref >=60)
GFR, Est Non African American: 92 mL/min/{1.73_m2} (ref >=60)
Globulin: 2.3 g/dL (calc) (ref 1.9–3.7)
Glucose, Bld: 101 mg/dL — ABNORMAL HIGH (ref 65–99)
Potassium: 4.1 mmol/L (ref 3.5–5.3)
Sodium: 140 mmol/L (ref 135–146)
Total Bilirubin: 0.6 mg/dL (ref 0.2–1.2)
Total Protein: 7 g/dL (ref 6.1–8.1)

## 2019-11-09 LAB — TESTOSTERONE: Testosterone: 985 ng/dL — ABNORMAL HIGH (ref 250–827)

## 2019-11-09 LAB — MAGNESIUM: Magnesium: 2.2 mg/dL (ref 1.5–2.5)

## 2019-11-09 LAB — LIPID PANEL
Cholesterol: 118 mg/dL (ref <200)
HDL: 64 mg/dL (ref >=40)
LDL Cholesterol (Calc): 35 mg/dL (calc)
Non-HDL Cholesterol (Calc): 54 mg/dL (calc) (ref <130)
Total CHOL/HDL Ratio: 1.8 (calc) (ref <5.0)
Triglycerides: 103 mg/dL (ref <150)

## 2019-11-09 LAB — INSULIN, RANDOM: Insulin: 11.2 u[IU]/mL

## 2019-11-09 LAB — TSH: TSH: 1.27 mIU/L (ref 0.40–4.50)

## 2019-11-09 LAB — HEMOGLOBIN A1C
Hgb A1c MFr Bld: 5.1 % of total Hgb (ref <5.7)
Mean Plasma Glucose: 100 (calc)
eAG (mmol/L): 5.5 (calc)

## 2019-11-09 LAB — VITAMIN D 25 HYDROXY (VIT D DEFICIENCY, FRACTURES): Vit D, 25-Hydroxy: 88 ng/mL (ref 30–100)

## 2019-11-09 NOTE — Progress Notes (Signed)
========================================================== -   Test results slightly outside the reference range are not unusual. If there is anything important, I will review this with you,  otherwise it is considered normal test values.  If you have further questions,  please do not hesitate to contact me at the office or via My Chart.  ==========================================================  -  Total Chol = 118 and LDL Chol = 35 -  Excellent   - Very low risk for Heart Attack  / Stroke =============================================================  - A1c - Normal - Great - No Diabetes ==========================================================  -  Vitamin D = 88 - Excellent  ==========================================================  -  Testosterone - Great  ==========================================================  -  All Else - CBC - Kidneys - Electrolytes - Liver - Magnesium & Thyroid    - all  Normal / OK ====================================================   - Keep up the [place] Work   !   ==========================================================

## 2019-11-19 ENCOUNTER — Other Ambulatory Visit: Payer: Self-pay | Admitting: Internal Medicine

## 2019-11-19 DIAGNOSIS — R0989 Other specified symptoms and signs involving the circulatory and respiratory systems: Secondary | ICD-10-CM

## 2019-11-29 ENCOUNTER — Ambulatory Visit: Payer: BC Managed Care – PPO | Admitting: Rehabilitative and Restorative Service Providers"

## 2019-11-29 ENCOUNTER — Encounter: Payer: Self-pay | Admitting: Rehabilitative and Restorative Service Providers"

## 2019-11-29 ENCOUNTER — Other Ambulatory Visit: Payer: Self-pay

## 2019-11-29 DIAGNOSIS — M6281 Muscle weakness (generalized): Secondary | ICD-10-CM | POA: Diagnosis not present

## 2019-11-29 DIAGNOSIS — M25612 Stiffness of left shoulder, not elsewhere classified: Secondary | ICD-10-CM | POA: Diagnosis not present

## 2019-11-29 DIAGNOSIS — M25512 Pain in left shoulder: Secondary | ICD-10-CM

## 2019-11-29 DIAGNOSIS — G8929 Other chronic pain: Secondary | ICD-10-CM

## 2019-11-29 DIAGNOSIS — L570 Actinic keratosis: Secondary | ICD-10-CM | POA: Diagnosis not present
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
## 2019-11-29 NOTE — Patient Instructions (Signed)
Access Code: [email or web address] URL: [email or web address] Date: 11/29/2019 Prepared by: Vista Mink  Exercises Standing Scapular Retraction - 5 x daily - 7 x weekly - 1 sets - 5 reps - 5 hold Standing Shoulder Internal Rotation Stretch with Hands Behind Back - 3-5 x daily - 7 x weekly - 1 sets - 10 reps - 10 hold Supine Shoulder Internal Rotation Stretch - 2 x daily - 7 x weekly - 1 sets - 10-20 reps - 10 secondsinutes hold Shoulder External Rotation with Anchored Resistance with Towel Under Elbow - 1 x daily - 3 x weekly - 2-4 sets - 10-15 reps - 3 hold Supine Bilateral Punches - 2 x daily - 7 x weekly - 1 sets - 20 reps - 3 seconds hold

## 2019-12-08 ENCOUNTER — Encounter: Payer: Self-pay | Admitting: Orthopedic Surgery

## 2019-12-08 ENCOUNTER — Ambulatory Visit: Payer: BC Managed Care – PPO | Admitting: Orthopedic Surgery

## 2019-12-08 DIAGNOSIS — M75102 Unspecified rotator cuff tear or rupture of left shoulder, not specified as traumatic: Secondary | ICD-10-CM | POA: Diagnosis not present
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
## 2019-12-18 ENCOUNTER — Other Ambulatory Visit: Payer: Self-pay | Admitting: Internal Medicine

## 2019-12-18 DIAGNOSIS — N401 Enlarged prostate with lower urinary tract symptoms: Secondary | ICD-10-CM

## 2019-12-18 DIAGNOSIS — N138 Other obstructive and reflux uropathy: Secondary | ICD-10-CM

## 2019-12-29 ENCOUNTER — Other Ambulatory Visit: Payer: Self-pay | Admitting: Adult Health

## 2019-12-29 ENCOUNTER — Other Ambulatory Visit: Payer: Self-pay | Admitting: Internal Medicine

## 2019-12-29 DIAGNOSIS — I10 Essential (primary) hypertension: Secondary | ICD-10-CM

## 2019-12-29 DIAGNOSIS — N138 Other obstructive and reflux uropathy: Secondary | ICD-10-CM

## 2019-12-29 DIAGNOSIS — N401 Enlarged prostate with lower urinary tract symptoms: Secondary | ICD-10-CM

## 2020-01-18 ENCOUNTER — Other Ambulatory Visit: Payer: Self-pay | Admitting: Internal Medicine

## 2020-01-18 DIAGNOSIS — F411 Generalized anxiety disorder: Secondary | ICD-10-CM

## 2020-01-20 ENCOUNTER — Other Ambulatory Visit: Payer: Self-pay | Admitting: Internal Medicine

## 2020-02-08 ENCOUNTER — Other Ambulatory Visit: Payer: Self-pay

## 2020-02-08 ENCOUNTER — Ambulatory Visit (INDEPENDENT_AMBULATORY_CARE_PROVIDER_SITE_OTHER): Payer: BC Managed Care – PPO | Admitting: Internal Medicine

## 2020-02-08 ENCOUNTER — Encounter: Payer: Self-pay | Admitting: Internal Medicine

## 2020-02-08 VITALS — BP 114/72 | HR 56 | Temp 97.2°F | Resp 16 | Ht 65.0 in | Wt 192.0 lb

## 2020-02-08 DIAGNOSIS — I10 Essential (primary) hypertension: Secondary | ICD-10-CM

## 2020-02-08 DIAGNOSIS — Z79899 Other long term (current) drug therapy: Secondary | ICD-10-CM

## 2020-02-08 DIAGNOSIS — E559 Vitamin D deficiency, unspecified: Secondary | ICD-10-CM

## 2020-02-08 DIAGNOSIS — E782 Mixed hyperlipidemia: Secondary | ICD-10-CM

## 2020-02-08 DIAGNOSIS — E349 Endocrine disorder, unspecified: Secondary | ICD-10-CM

## 2020-02-08 DIAGNOSIS — R7309 Other abnormal glucose: Secondary | ICD-10-CM
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
## 2020-02-08 NOTE — Patient Instructions (Signed)

## 2020-02-09 LAB — COMPLETE METABOLIC PANEL WITH GFR
AG Ratio: 2.1 (calc) (ref 1.0–2.5)
ALT: 32 U/L (ref 9–46)
AST: 33 U/L (ref 10–35)
Albumin: 4.6 g/dL (ref 3.6–5.1)
Alkaline phosphatase (APISO): 47 U/L (ref 35–144)
BUN: 22 mg/dL (ref 7–25)
CO2: 26 mmol/L (ref 20–32)
Calcium: 9.9 mg/dL (ref 8.6–10.3)
Chloride: 104 mmol/L (ref 98–110)
Creat: 0.91 mg/dL (ref 0.70–1.25)
GFR, Est African American: 105 mL/min/{1.73_m2} (ref >=60)
GFR, Est Non African American: 91 mL/min/{1.73_m2} (ref >=60)
Globulin: 2.2 g/dL (calc) (ref 1.9–3.7)
Glucose, Bld: 86 mg/dL (ref 65–99)
Potassium: 4.1 mmol/L (ref 3.5–5.3)
Sodium: 138 mmol/L (ref 135–146)
Total Bilirubin: 0.6 mg/dL (ref 0.2–1.2)
Total Protein: 6.8 g/dL (ref 6.1–8.1)

## 2020-02-09 LAB — CBC WITH DIFFERENTIAL/PLATELET
Absolute Monocytes: 713 cells/uL (ref 200–950)
Basophils Absolute: 81 cells/uL (ref 0–200)
Basophils Relative: 1.4 %
Eosinophils Absolute: 99 cells/uL (ref 15–500)
Eosinophils Relative: 1.7 %
HCT: 45.5 % (ref 38.5–50.0)
Hemoglobin: 15.2 g/dL (ref 13.2–17.1)
Lymphs Abs: 1148 cells/uL (ref 850–3900)
MCH: 31.9 pg (ref 27.0–33.0)
MCHC: 33.4 g/dL (ref 32.0–36.0)
MCV: 95.6 fL (ref 80.0–100.0)
MPV: 11.3 fL (ref 7.5–12.5)
Monocytes Relative: 12.3 %
Neutro Abs: 3758 cells/uL (ref 1500–7800)
Neutrophils Relative %: 64.8 %
Platelets: 200 10*3/uL (ref 140–400)
RBC: 4.76 10*6/uL (ref 4.20–5.80)
RDW: 11.8 % (ref 11.0–15.0)
Total Lymphocyte: 19.8 %
WBC: 5.8 10*3/uL (ref 3.8–10.8)

## 2020-02-09 LAB — LIPID PANEL
Cholesterol: 117 mg/dL (ref <200)
HDL: 56 mg/dL (ref >=40)
LDL Cholesterol (Calc): 47 mg/dL (calc)
Non-HDL Cholesterol (Calc): 61 mg/dL (calc) (ref <130)
Total CHOL/HDL Ratio: 2.1 (calc) (ref <5.0)
Triglycerides: 67 mg/dL (ref <150)

## 2020-02-09 LAB — MAGNESIUM: Magnesium: 2 mg/dL (ref 1.5–2.5)

## 2020-02-09 LAB — HEMOGLOBIN A1C
Hgb A1c MFr Bld: 5.2 % of total Hgb (ref <5.7)
Mean Plasma Glucose: 103 mg/dL
eAG (mmol/L): 5.7 mmol/L

## 2020-02-09 LAB — VITAMIN D 25 HYDROXY (VIT D DEFICIENCY, FRACTURES): Vit D, 25-Hydroxy: 87 ng/mL (ref 30–100)

## 2020-02-09 LAB — TSH: TSH: 1.51 mIU/L (ref 0.40–4.50)

## 2020-02-09 LAB — TESTOSTERONE: Testosterone: 1131 ng/dL — ABNORMAL HIGH (ref 250–827)

## 2020-02-09 LAB — INSULIN, RANDOM: Insulin: 6.4 u[IU]/mL

## 2020-02-09 NOTE — Progress Notes (Signed)
========================================================== -   Test results slightly outside the reference range are not unusual. If there is anything important, I will review this with you,  otherwise it is considered normal test values.  If you have further questions,  please do not hesitate to contact me at the office or via My Chart.  ==========================================================  -  Total Chol= 117 and LDL Chol = 47 - Both  Excellent   - Very low risk for Heart Attack  / Stroke ========================================================  - A1c - Normal - Great  - No Diabetes  ! ==========================================================  -  Vitamin D = 87 - Excellent  !  ==========================================================  -  Testosterone Elevated from Recent Shot. ==========================================================  -  All Else - CBC - Kidneys - Electrolytes - Liver - Magnesium & Thyroid    - all  Normal / OK ==========================================================   - Keep up the [place] Work  ! ==========================================================

## 2020-02-21 ENCOUNTER — Other Ambulatory Visit: Payer: Self-pay | Admitting: Internal Medicine

## 2020-03-20 ENCOUNTER — Other Ambulatory Visit: Payer: Self-pay | Admitting: Internal Medicine

## 2020-03-23 ENCOUNTER — Emergency Department (HOSPITAL_BASED_OUTPATIENT_CLINIC_OR_DEPARTMENT_OTHER): Payer: BC Managed Care – PPO

## 2020-03-23 ENCOUNTER — Encounter (HOSPITAL_BASED_OUTPATIENT_CLINIC_OR_DEPARTMENT_OTHER): Payer: Self-pay | Admitting: *Deleted

## 2020-03-23 ENCOUNTER — Other Ambulatory Visit: Payer: Self-pay

## 2020-03-23 ENCOUNTER — Emergency Department (HOSPITAL_BASED_OUTPATIENT_CLINIC_OR_DEPARTMENT_OTHER)
Admission: EM | Admit: 2020-03-23 | Discharge: 2020-03-23 | Disposition: A | Discharge status: Home / Self Care | Payer: BC Managed Care – PPO | Attending: Emergency Medicine | Admitting: Emergency Medicine

## 2020-03-23 DIAGNOSIS — Z8719 Personal history of other diseases of the digestive system: Secondary | ICD-10-CM | POA: Insufficient documentation

## 2020-03-23 DIAGNOSIS — K529 Noninfective gastroenteritis and colitis, unspecified: Secondary | ICD-10-CM

## 2020-03-23 DIAGNOSIS — R001 Bradycardia, unspecified: Secondary | ICD-10-CM | POA: Diagnosis not present

## 2020-03-23 DIAGNOSIS — Z79899 Other long term (current) drug therapy: Secondary | ICD-10-CM | POA: Insufficient documentation

## 2020-03-23 DIAGNOSIS — Z96653 Presence of artificial knee joint, bilateral: Secondary | ICD-10-CM | POA: Insufficient documentation

## 2020-03-23 DIAGNOSIS — I10 Essential (primary) hypertension: Secondary | ICD-10-CM | POA: Insufficient documentation

## 2020-03-23 DIAGNOSIS — K5792 Diverticulitis of intestine, part unspecified, without perforation or abscess without bleeding: Secondary | ICD-10-CM | POA: Diagnosis not present

## 2020-03-23 DIAGNOSIS — K8020 Calculus of gallbladder without cholecystitis without obstruction: Secondary | ICD-10-CM | POA: Diagnosis not present

## 2020-03-23 DIAGNOSIS — Z87891 Personal history of nicotine dependence: Secondary | ICD-10-CM | POA: Insufficient documentation

## 2020-03-23 DIAGNOSIS — K5732 Diverticulitis of large intestine without perforation or abscess without bleeding: Secondary | ICD-10-CM | POA: Diagnosis not present

## 2020-03-23 DIAGNOSIS — R1013 Epigastric pain: Secondary | ICD-10-CM | POA: Diagnosis not present

## 2020-03-23 LAB — COMPREHENSIVE METABOLIC PANEL
ALT: 29 U/L (ref 0–44)
AST: 27 U/L (ref 15–41)
Albumin: 4.4 g/dL (ref 3.5–5.0)
Alkaline Phosphatase: 44 U/L (ref 38–126)
Anion gap: 10 (ref 5–15)
BUN: 26 mg/dL — ABNORMAL HIGH (ref 8–23)
CO2: 26 mmol/L (ref 22–32)
Calcium: 9.7 mg/dL (ref 8.9–10.3)
Chloride: 101 mmol/L (ref 98–111)
Creatinine, Ser: 1.07 mg/dL (ref 0.61–1.24)
GFR, Estimated: >60 mL/min (ref >60)
Glucose, Bld: 92 mg/dL (ref 70–99)
Potassium: 4.1 mmol/L (ref 3.5–5.1)
Sodium: 137 mmol/L (ref 135–145)
Total Bilirubin: 0.6 mg/dL (ref 0.3–1.2)
Total Protein: 7 g/dL (ref 6.5–8.1)

## 2020-03-23 LAB — CBC
HCT: 47.3 % (ref 39.0–52.0)
Hemoglobin: 16 g/dL (ref 13.0–17.0)
MCH: 32.1 pg (ref 26.0–34.0)
MCHC: 33.8 g/dL (ref 30.0–36.0)
MCV: 94.8 fL (ref 80.0–100.0)
Platelets: 186 10*3/uL (ref 150–400)
RBC: 4.99 MIL/uL (ref 4.22–5.81)
RDW: 12 % (ref 11.5–15.5)
WBC: 8.8 10*3/uL (ref 4.0–10.5)
nRBC: 0 % (ref 0.0–0.2)

## 2020-03-23 LAB — LIPASE, BLOOD: Lipase: 34 U/L (ref 11–51)

## 2020-03-23 MED ORDER — IOHEXOL 300 MG/ML  SOLN
100.0000 mL | Freq: Once | INTRAMUSCULAR | Status: AC
  Administered 2020-03-23: 100 mL via INTRAVENOUS

## 2020-03-23 MED ORDER — MORPHINE SULFATE (PF) 2 MG/ML IV SOLN
2.0000 mg | Freq: Once | INTRAVENOUS | Status: AC
  Administered 2020-03-23: 2 mg via INTRAVENOUS
  Filled 2020-03-23: qty 1

## 2020-03-23 MED ORDER — MORPHINE SULFATE (PF) 4 MG/ML IV SOLN
4.0000 mg | Freq: Once | INTRAVENOUS | Status: AC
  Administered 2020-03-23: 4 mg via INTRAVENOUS
  Filled 2020-03-23: qty 1

## 2020-03-23 MED ORDER — CIPROFLOXACIN HCL 500 MG PO TABS: 500.0000 mg | ORAL_TABLET | Freq: Two times a day (BID) | ORAL | 0 refills | Status: AC

## 2020-03-23 MED ORDER — METRONIDAZOLE 500 MG PO TABS: 500.0000 mg | ORAL_TABLET | Freq: Three times a day (TID) | ORAL | 0 refills | Status: AC

## 2020-03-23 MED ORDER — FENTANYL CITRATE (PF) 100 MCG/2ML IJ SOLN
100.0000 ug | Freq: Once | INTRAMUSCULAR | Status: AC
  Administered 2020-03-23: 100 ug via INTRAVENOUS
  Filled 2020-03-23: qty 2

## 2020-03-23 MED ORDER — CIPROFLOXACIN HCL 500 MG PO TABS
500.0000 mg | ORAL_TABLET | Freq: Once | ORAL | Status: AC
  Administered 2020-03-23: 500 mg via ORAL
  Filled 2020-03-23: qty 1

## 2020-03-23 MED ORDER — METRONIDAZOLE 500 MG PO TABS
500.0000 mg | ORAL_TABLET | Freq: Once | ORAL | Status: AC
  Administered 2020-03-23: 500 mg via ORAL
  Filled 2020-03-23: qty 1

## 2020-03-23 NOTE — Discharge Instructions (Signed)
You came to the emergency department today to be evaluated for your abdominal pain.  Your lab work was reassuring.  Your CT scan showed uncomplicated diverticulitis.  Therefore you were started on the antibiotics ciprofloxacin and Flagyl.  Please take ciprofloxacin 1 pill every 12 hours and Flagyl 1 pill every 8 hours.    You may have diarrhea from the antibiotics.  It is very important that you continue to take the antibiotics even if you get diarrhea unless a medical professional tells you that you may stop taking them.  If you stop too early the bacteria you are being treated for will become stronger and you may need different, more powerful antibiotics that have more side effects and worsening diarrhea.  Please stay well hydrated and consider probiotics as they may decrease the severity of your diarrhea.  Your CT scan also showed gallstones and some dilation of your bowel due to enteritis.  Please follow-up with your primary care provider next week to be reevaluated.  They may determine you need to have a repeat CT scan.    Please return to the emergency department if: Your pain gets worse. Your symptoms do not get better with treatment. Your symptoms suddenly get worse. You have a fever. You have persistent vomiting You have stools that are bloody, black, or tarry.

## 2020-03-23 NOTE — ED Triage Notes (Signed)
Epigastric pain x 3 hours.
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
## 2020-03-23 NOTE — ED Notes (Signed)
Pt via pov from home with abdominal pain x 3 hours. States it comes in waves, points to epigastric area and states that it radiates down. Pt alert & oriented, nad noted.

## 2020-03-25 ENCOUNTER — Other Ambulatory Visit: Payer: Self-pay | Admitting: Internal Medicine

## 2020-03-25 DIAGNOSIS — K50019 Crohn's disease of small intestine with unspecified complications: Secondary | ICD-10-CM

## 2020-03-25 DIAGNOSIS — K5733 Diverticulitis of large intestine without perforation or abscess with bleeding: Secondary | ICD-10-CM

## 2020-04-04 ENCOUNTER — Other Ambulatory Visit: Payer: Self-pay | Admitting: Internal Medicine

## 2020-04-04 DIAGNOSIS — N138 Other obstructive and reflux uropathy: Secondary | ICD-10-CM

## 2020-04-04 DIAGNOSIS — N401 Enlarged prostate with lower urinary tract symptoms: Secondary | ICD-10-CM

## 2020-04-04 MED ORDER — TADALAFIL 5 MG PO TABS: ORAL_TABLET | ORAL | 0 refills | Status: DC

## 2020-04-09 ENCOUNTER — Telehealth: Payer: Self-pay

## 2020-04-09 ENCOUNTER — Ambulatory Visit: Payer: BC Managed Care – PPO | Admitting: Physician Assistant

## 2020-04-09 NOTE — Telephone Encounter (Signed)
Ok, thanks.

## 2020-04-09 NOTE — Telephone Encounter (Signed)
Spoke with the patient.  He completed his treatment for diverticulitis approximately 2 weeks ago. Presently feels he has had improvement in his abdominal symptoms. Endorses that he does has some residual abdominal discomfort. He does not have antispasmodics and does not feel he needs them. No problems with his bowel movements. Denies any fever or nausea.  He will call us this afternoon to reschedule his appointment. (Todays's appointment had to be cancelled due to provider not available.)

## 2020-04-19 ENCOUNTER — Ambulatory Visit: Payer: BC Managed Care – PPO | Admitting: Physician Assistant

## 2020-04-19 ENCOUNTER — Encounter: Payer: Self-pay | Admitting: Physician Assistant

## 2020-04-19 VITALS — BP 132/76 | HR 93 | Ht 65.0 in | Wt 193.6 lb

## 2020-04-19 DIAGNOSIS — R1013 Epigastric pain: Secondary | ICD-10-CM | POA: Diagnosis not present

## 2020-04-19 DIAGNOSIS — Z8719 Personal history of other diseases of the digestive system: Secondary | ICD-10-CM | POA: Diagnosis not present

## 2020-04-19 DIAGNOSIS — Z86010 Personal history of colonic polyps: Secondary | ICD-10-CM | POA: Diagnosis not present
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
## 2020-04-19 NOTE — Patient Instructions (Signed)
If you are age 62 or older, your body mass index should be between 23-30. Your Body mass index is 32.22 kg/m. If this is out of the aforementioned range listed, please consider follow up with your Primary Care Provider.  If you are age 8 or younger, your body mass index should be between 19-25. Your Body mass index is 32.22 kg/m. If this is out of the aformentioned range listed, please consider follow up with your Primary Care Provider.   You have been scheduled for an endoscopy and colonoscopy. Please follow the written instructions given to you at your visit today. Please pick up your prep supplies at the pharmacy within the next 1-3 days. If you use inhalers (even only as needed), please bring them with you on the day of your procedure.  Thank you for choosing me and [facility].  Ellouise Newer, PA-C

## 2020-04-19 NOTE — Progress Notes (Signed)
Agree with assessment and plan as outlined.  

## 2020-05-03 ENCOUNTER — Other Ambulatory Visit: Payer: Self-pay | Admitting: Internal Medicine

## 2020-05-03 DIAGNOSIS — J301 Allergic rhinitis due to pollen: Secondary | ICD-10-CM

## 2020-05-07 ENCOUNTER — Encounter: Payer: Self-pay | Admitting: Internal Medicine

## 2020-05-07 DIAGNOSIS — I700 Atherosclerosis of aorta: Secondary | ICD-10-CM | POA: Insufficient documentation
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
## 2020-05-07 NOTE — Patient Instructions (Signed)
Due to recent changes in healthcare laws, you may see the results of your imaging and laboratory studies on MyChart before your provider has had a chance to review them.  We understand that in some cases there may be results that are confusing or concerning to you. Not all laboratory results come back in the same time frame and the provider may be waiting for multiple results in order to interpret others.  Please give us 48 hours in order for your provider to thoroughly review all the results before contacting the office for clarification of your results.   ++++++++++++++++++++++++++++++++++++++  Vit D  & Vit C 1,000 mg   are recommended to help protect  against the Covid-19 and other Corona viruses.    Also it's recommended  to take  Zinc 50 mg  to help  protect against the Covid-19   and best place to get  is also on [employer]  and don't pay more than 6-8 cents /pill !  =============================== Coronavirus (COVID-19) Are you at risk?  Are you at risk for the Coronavirus (COVID-19)?  To be considered HIGH RISK for Coronavirus (COVID-19), you have to meet the following criteria:  . Traveled to [place], [place], [place], [place] or [place]; or in the [place] to [place], [place], [place]  . or [place]; and have fever, cough, and shortness of breath within the last 2 weeks of travel OR . Been in close contact with a person diagnosed with COVID-19 within the last 2 weeks and have  . fever, cough,and shortness of breath .  . IF YOU DO NOT MEET THESE CRITERIA, YOU ARE CONSIDERED LOW RISK FOR COVID-19.  What to do if you are HIGH RISK for COVID-19?  . If you are having a medical emergency, call 911. . Seek medical care right away. Before you go to a doctor's office, urgent care or emergency department, .  call ahead and tell them about your recent travel, contact with someone diagnosed with COVID-19  .  and your symptoms.  . You should receive instructions from your  physician's office regarding next steps of care.  . When you arrive at healthcare provider, tell the healthcare staff immediately you have returned from  . visiting [place], [place], [place], [place] or [place]; or traveled in the [place] to [place], [place],  . [place] or [place] in the last two weeks or you have been in close contact with a person diagnosed with  . COVID-19 in the last 2 weeks.   . Tell the health care staff about your symptoms: fever, cough and shortness of breath. . After you have been seen by a medical provider, you will be either: o Tested for (COVID-19) and discharged home on quarantine except to seek medical care if  o symptoms worsen, and asked to  - Stay home and avoid contact with others until you get your results (4-5 days)  - Avoid travel on public transportation if possible (such as bus, train, or airplane) or o Sent to the Emergency Department by EMS for evaluation, COVID-19 testing  and  o possible admission depending on your condition and test results.  What to do if you are LOW RISK for COVID-19?  Reduce your risk of any infection by using the same precautions used for avoiding the common cold or flu:  . Wash your hands often with soap and warm water for at least 20 seconds.  If soap and water are not readily   available,  . use an alcohol-based hand sanitizer with at least 60% alcohol.  . If coughing or sneezing, cover your mouth and nose by coughing or sneezing into the elbow areas of your shirt or coat, .  into a tissue or into your sleeve (not your hands). . Avoid shaking hands with others and consider head nods or verbal greetings only. . Avoid touching your eyes, nose, or mouth with unwashed hands.  . Avoid close contact with people who are sick. . Avoid places or events with large numbers of people in one location, like concerts or sporting events. . Carefully consider travel plans you have or are making. . If you are planning any travel  outside or inside the [place], visit the CDC's Travelers' Health webpage for the latest health notices. . If you have some symptoms but not all symptoms, continue to monitor at home and seek medical attention  . if your symptoms worsen. . If you are having a medical emergency, call 911. >>>>>>>>>>>>>>>>>>>>>>>>>>>> Preventive Care for Adults  A healthy lifestyle and preventive care can promote health and wellness. Preventive health guidelines for men include the following key practices:  A routine yearly physical is a good way to check with your health care provider about your health and preventative screening. It is a chance to share any concerns and updates on your health and to receive a thorough exam.  Visit your dentist for a routine exam and preventative care every 6 months. Brush your teeth twice a day and floss once a day. Good oral hygiene prevents tooth decay and gum disease.  The frequency of eye exams is based on your age, health, family medical history, use of contact lenses, and other factors. Follow your health care provider's recommendations for frequency of eye exams.  Eat a healthy diet. Foods such as vegetables, fruits, whole grains, low-fat dairy products, and lean protein foods contain the nutrients you need without too many calories. Decrease your intake of foods high in solid fats, added sugars, and salt. Eat the right amount of calories for you. Get information about a proper diet from your health care provider, if necessary.  Regular physical exercise is one of the most important things you can do for your health. Most adults should get at least 150 minutes of moderate-intensity exercise (any activity that increases your heart rate and causes you to sweat) each week. In addition, most adults need muscle-strengthening exercises on 2 or more days a week.  Maintain a healthy weight. The body mass index (BMI) is a screening tool to identify possible weight problems. It provides an  estimate of body fat based on height and weight. Your health care provider can find your BMI and can help you achieve or maintain a healthy weight. For adults 20 years and older:  A BMI below 18.5 is considered underweight.  A BMI of 18.5 to 24.9 is normal.  A BMI of 25 to 29.9 is considered overweight.  A BMI of 30 and above is considered obese.  Maintain normal blood lipids and cholesterol levels by exercising and minimizing your intake of saturated fat. Eat a balanced diet with plenty of fruit and vegetables. Blood tests for lipids and cholesterol should begin at age 20 and be repeated every 5 years. If your lipid or cholesterol levels are high, you are over 50, or you are at high risk for heart disease, you may need your cholesterol levels checked more frequently. Ongoing high lipid and cholesterol levels should be treated   with medicines if diet and exercise are not working.  If you smoke, find out from your health care provider how to quit. If you do not use tobacco, do not start.  Lung cancer screening is recommended for adults aged 55-80 years who are at high risk for developing lung cancer because of a history of smoking. A yearly low-dose CT scan of the lungs is recommended for people who have at least a 30-pack-year history of smoking and are a current smoker or have quit within the past 15 years. A pack year of smoking is smoking an average of 1 pack of cigarettes a day for 1 year (for example: 1 pack a day for 30 years or 2 packs a day for 15 years). Yearly screening should continue until the smoker has stopped smoking for at least 15 years. Yearly screening should be stopped for people who develop a health problem that would prevent them from having lung cancer treatment.  If you choose to drink alcohol, do not have more than 2 drinks per day. One drink is considered to be 12 ounces (355 mL) of beer, 5 ounces (148 mL) of wine, or 1.5 ounces (44 mL) of liquor.  Avoid use of street  drugs. Do not share needles with anyone. Ask for help if you need support or instructions about stopping the use of drugs.  High blood pressure causes heart disease and increases the risk of stroke. Your blood pressure should be checked at least every 1-2 years. Ongoing high blood pressure should be treated with medicines, if weight loss and exercise are not effective.  If you are 45-79 years old, ask your health care provider if you should take aspirin to prevent heart disease.  Diabetes screening involves taking a blood sample to check your fasting blood sugar level. This should be done once every 3 years, after age 45, if you are within normal weight and without risk factors for diabetes. Testing should be considered at a younger age or be carried out more frequently if you are overweight and have at least 1 risk factor for diabetes.  Colorectal cancer can be detected and often prevented. Most routine colorectal cancer screening begins at the age of 50 and continues through age 75. However, your health care provider may recommend screening at an earlier age if you have risk factors for colon cancer. On a yearly basis, your health care provider may provide home test kits to check for hidden blood in the stool. Use of a small camera at the end of a tube to directly examine the colon (sigmoidoscopy or colonoscopy) can detect the earliest forms of colorectal cancer. Talk to your health care provider about this at age 50, when routine screening begins. Direct exam of the colon should be repeated every 5-10 years through age 75, unless early forms of precancerous polyps or small growths are found.   Talk with your health care provider about prostate cancer screening.  Testicular cancer screening isrecommended for adult males. Screening includes self-exam, a health care provider exam, and other screening tests. Consult with your health care provider about any symptoms you have or any concerns you have about  testicular cancer.  Use sunscreen. Apply sunscreen liberally and repeatedly throughout the day. You should seek shade when your shadow is shorter than you. Protect yourself by wearing long sleeves, pants, a wide-brimmed hat, and sunglasses year round, whenever you are outdoors.  Once a month, do a whole-body skin exam, using a mirror to look at   the skin on your back. Tell your health care provider about new moles, moles that have irregular borders, moles that are larger than a pencil eraser, or moles that have changed in shape or color.  Stay current with required vaccines (immunizations).  Influenza vaccine. All adults should be immunized every year.  Tetanus, diphtheria, and acellular pertussis (Td, Tdap) vaccine. An adult who has not previously received Tdap or who does not know his vaccine status should receive 1 dose of Tdap. This initial dose should be followed by tetanus and diphtheria toxoids (Td) booster doses every 10 years. Adults with an unknown or incomplete history of completing a 3-dose immunization series with Td-containing vaccines should begin or complete a primary immunization series including a Tdap dose. Adults should receive a Td booster every 10 years.  Varicella vaccine. An adult without evidence of immunity to varicella should receive 2 doses or a second dose if he has previously received 1 dose.  Human papillomavirus (HPV) vaccine. Males aged 13-21 years who have not received the vaccine previously should receive the 3-dose series. Males aged 22-26 years may be immunized. Immunization is recommended through the age of 26 years for any male who has sex with males and did not get any or all doses earlier. Immunization is recommended for any person with an immunocompromised condition through the age of 26 years if he did not get any or all doses earlier. During the 3-dose series, the second dose should be obtained 4-8 weeks after the first dose. The third dose should be obtained  24 weeks after the first dose and 16 weeks after the second dose.  Zoster vaccine. One dose is recommended for adults aged 60 years or older unless certain conditions are present.    PREVNAR  - Pneumococcal 13-valent conjugate (PCV13) vaccine. When indicated, a person who is uncertain of his immunization history and has no record of immunization should receive the PCV13 vaccine. An adult aged 19 years or older who has certain medical conditions and has not been previously immunized should receive 1 dose of PCV13 vaccine. This PCV13 should be followed with a dose of pneumococcal polysaccharide (PPSV23) vaccine. The PPSV23 vaccine dose should be obtained at least 1 r more year(s) after the dose of PCV13 vaccine. An adult aged 19 years or older who has certain medical conditions and previously received 1 or more doses of PPSV23 vaccine should receive 1 dose of PCV13. The PCV13 vaccine dose should be obtained 1 or more years after the last PPSV23 vaccine dose.    PNEUMOVAX - Pneumococcal polysaccharide (PPSV23) vaccine. When PCV13 is also indicated, PCV13 should be obtained first. All adults aged 65 years and older should be immunized. An adult younger than age 65 years who has certain medical conditions should be immunized. Any person who resides in a nursing home or long-term care facility should be immunized. An adult smoker should be immunized. People with an immunocompromised condition and certain other conditions should receive both PCV13 and PPSV23 vaccines. People with human immunodeficiency virus (HIV) infection should be immunized as soon as possible after diagnosis. Immunization during chemotherapy or radiation therapy should be avoided. Routine use of PPSV23 vaccine is not recommended for American Indians, [place] Natives, or people younger than 65 years unless there are medical conditions that require PPSV23 vaccine. When indicated, people who have unknown immunization and have no record of  immunization should receive PPSV23 vaccine. One-time revaccination 5 years after the first dose of PPSV23 is recommended for people aged   19-64 years who have chronic kidney failure, nephrotic syndrome, asplenia, or immunocompromised conditions. People who received 1-2 doses of PPSV23 before age 65 years should receive another dose of PPSV23 vaccine at age 65 years or later if at least 5 years have passed since the previous dose. Doses of PPSV23 are not needed for people immunized with PPSV23 at or after age 65 years.    Hepatitis A vaccine. Adults who wish to be protected from this disease, have certain high-risk conditions, work with hepatitis A-infected animals, work in hepatitis A research labs, or travel to or work in countries with a high rate of hepatitis A should be immunized. Adults who were previously unvaccinated and who anticipate close contact with an international adoptee during the first 60 days after arrival in the [place] from a country with a high rate of hepatitis A should be immunized.    Hepatitis B vaccine. Adults should be immunized if they wish to be protected from this disease, have certain high-risk conditions, may be exposed to blood or other infectious body fluids, are household contacts or sex partners of hepatitis B positive people, are clients or workers in certain care facilities, or travel to or work in countries with a high rate of hepatitis B.   Preventive Service / Frequency   Ages 40 to 64  Blood pressure check.  Lipid and cholesterol check  Lung cancer screening. / Every year if you are aged 55-80 years and have a 30-pack-year history of smoking and currently smoke or have quit within the past 15 years. Yearly screening is stopped once you have quit smoking for at least 15 years or develop a health problem that would prevent you from having lung cancer treatment.  Fecal occult blood test (FOBT) of stool. / Every year beginning at age 50 and continuing  until age 75. You may not have to do this test if you get a colonoscopy every 10 years.  Flexible sigmoidoscopy** or colonoscopy.** / Every 5 years for a flexible sigmoidoscopy or every 10 years for a colonoscopy beginning at age 50 and continuing until age 75. Screening for abdominal aortic aneurysm (AAA)  by ultrasound is recommended for people who have history of high blood pressure or who are current or former smokers. +++++++++++ Recommend Adult Low Dose Aspirin or  coated  Aspirin 81 mg daily  To reduce risk of Colon Cancer 40 %,  Skin Cancer 26 % ,  Malignant Melanoma 46%  and  Pancreatic cancer 60% ++++++++++++++++++++ Vitamin D goal  is between 70-100.  Please make sure that you are taking your Vitamin D as directed.  It is very important as a natural anti-inflammatory  helping hair, skin, and nails, as well as reducing stroke and heart attack risk.  It helps your bones and helps with mood. It also decreases numerous cancer risks so please take it as directed.  Low Vit D is associated with a 200-300% higher risk for CANCER  and 200-300% higher risk for HEART   ATTACK  &  STROKE.   ...................................... It is also associated with higher death rate at younger ages,  autoimmune diseases like Rheumatoid arthritis, Lupus, Multiple Sclerosis.    Also many other serious conditions, like depression, Alzheimer's Dementia, infertility, muscle aches, fatigue, fibromyalgia - just to name a few. +++++++++++++++++++++ Recommend the book "The END of DIETING" by Dr Lorn Fuhrman  & the book "The END of DIABETES " by Dr Justus Fuhrman At [employer] - get book & Audio CD's      Being diabetic has a  300% increased risk for heart attack, stroke, cancer, and alzheimer- type vascular dementia. It is very important that you work harder with diet by avoiding all foods that are white. Avoid white rice (brown & wild rice is OK), white potatoes (sweetpotatoes in moderation is OK), White  bread or wheat bread or anything made out of white flour like bagels, donuts, rolls, buns, biscuits, cakes, pastries, cookies, pizza crust, and pasta (made from white flour & egg whites) - vegetarian pasta or spinach or wheat pasta is OK. Multigrain breads like Arnold's or Pepper[employer], or multigrain sandwich thins or flatbreads.  Diet, exercise and weight loss can reverse and cure diabetes in the early stages.  Diet, exercise and weight loss is very important in the control and prevention of complications of diabetes which affects every system in your body, ie. Brain - dementia/stroke, eyes - glaucoma/blindness, heart - heart attack/heart failure, kidneys - dialysis, stomach - gastric paralysis, intestines - malabsorption, nerves - severe painful neuritis, circulation - gangrene & loss of a leg(s), and finally cancer and Alzheimers.    I recommend avoid fried & greasy foods,  sweets/candy, white rice (brown or wild rice or Quinoa is OK), white potatoes (sweet potatoes are OK) - anything made from white flour - bagels, doughnuts, rolls, buns, biscuits,white and wheat breads, pizza crust and traditional pasta made of white flour & egg white(vegetarian pasta or spinach or wheat pasta is OK).  Multi-grain bread is OK - like multi-grain flat bread or sandwich thins. Avoid alcohol in excess. Exercise is also important.    Eat all the vegetables you want - avoid meat, especially red meat and dairy - especially cheese.  Cheese is the most concentrated form of trans-fats which is the worst thing to clog up our arteries. Veggie cheese is OK which can be found in the fresh produce section at [employer] or [employer] or [employer]  ++++++++++++++++++++++ DASH Eating Plan  DASH stands for "Dietary Approaches to Stop Hypertension."   The DASH eating plan is a healthy eating plan that has been shown to reduce high blood pressure (hypertension). Additional health benefits may include reducing the risk of type 2  diabetes mellitus, heart disease, and stroke. The DASH eating plan may also help with weight loss. WHAT DO I NEED TO KNOW ABOUT THE DASH EATING PLAN? For the DASH eating plan, you will follow these general guidelines:  Choose foods with a percent daily value for sodium of less than 5% (as listed on the food label).  Use salt-free seasonings or herbs instead of table salt or sea salt.  Check with your health care provider or pharmacist before using salt substitutes.  Eat lower-sodium products, often labeled as "lower sodium" or "no salt added."  Eat fresh foods.  Eat more vegetables, fruits, and low-fat dairy products.  Choose whole grains. Look for the word "whole" as the first word in the ingredient list.  Choose fish   Limit sweets, desserts, sugars, and sugary drinks.  Choose heart-healthy fats.  Eat veggie cheese   Eat more home-cooked food and less restaurant, buffet, and fast food.  Limit fried foods.  Cook foods using methods other than frying.  Limit canned vegetables. If you do use them, rinse them well to decrease the sodium.  When eating at a restaurant, ask that your food be prepared with less salt, or no salt if possible.                        WHAT FOODS CAN I EAT? Read Dr Aravind Fuhrman's books on The End of Dieting & The End of Diabetes  Grains Whole grain or whole wheat bread. Brown rice. Whole grain or whole wheat pasta. Quinoa, bulgur, and whole grain cereals. Low-sodium cereals. Corn or whole wheat flour tortillas. Whole grain cornbread. Whole grain crackers. Low-sodium crackers.  Vegetables Fresh or frozen vegetables (raw, steamed, roasted, or grilled). Low-sodium or reduced-sodium tomato and vegetable juices. Low-sodium or reduced-sodium tomato sauce and paste. Low-sodium or reduced-sodium canned vegetables.   Fruits All fresh, canned (in natural juice), or frozen fruits.  Protein Products  All fish and seafood.  Dried beans, peas, or lentils.  Unsalted nuts and seeds. Unsalted canned beans.  Dairy Low-fat dairy products, such as skim or 1% milk, 2% or reduced-fat cheeses, low-fat ricotta or cottage cheese, or plain low-fat yogurt. Low-sodium or reduced-sodium cheeses.  Fats and Oils Tub margarines without trans fats. Light or reduced-fat mayonnaise and salad dressings (reduced sodium). Avocado. Safflower, olive, or canola oils. Natural peanut or almond butter.  Other Unsalted popcorn and pretzels. The items listed above may not be a complete list of recommended foods or beverages. Contact your dietitian for more options.  +++++++++++++++++++  WHAT FOODS ARE NOT RECOMMENDED? Grains/ White flour or wheat flour White bread. White pasta. White rice. Refined cornbread. Bagels and croissants. Crackers that contain trans fat.  Vegetables  Creamed or fried vegetables. Vegetables in a . Regular canned vegetables. Regular canned tomato sauce and paste. Regular tomato and vegetable juices.  Fruits Dried fruits. Canned fruit in light or heavy syrup. Fruit juice.  Meat and Other Protein Products Meat in general - RED meat & White meat.  Fatty cuts of meat. Ribs, chicken wings, all processed meats as bacon, sausage, bologna, salami, fatback, hot dogs, bratwurst and packaged luncheon meats.  Dairy Whole or 2% milk, cream, half-and-half, and cream cheese. Whole-fat or sweetened yogurt. Full-fat cheeses or blue cheese. Non-dairy creamers and whipped toppings. Processed cheese, cheese spreads, or cheese curds.  Condiments Onion and garlic salt, seasoned salt, table salt, and sea salt. Canned and packaged gravies. Worcestershire sauce. Tartar sauce. Barbecue sauce. Teriyaki sauce. Soy sauce, including reduced sodium. Steak sauce. Fish sauce. Oyster sauce. Cocktail sauce. Horseradish. Ketchup and mustard. Meat flavorings and tenderizers. Bouillon cubes. Hot sauce. Tabasco sauce. Marinades. Taco seasonings. Relishes.  Fats and Oils Butter,  stick margarine, lard, shortening and bacon fat. Coconut, palm kernel, or palm oils. Regular salad dressings.  Pickles and olives. Salted popcorn and pretzels.  The items listed above may not be a complete list of foods and beverages to avoid.    

## 2020-05-08 ENCOUNTER — Ambulatory Visit (INDEPENDENT_AMBULATORY_CARE_PROVIDER_SITE_OTHER): Payer: BC Managed Care – PPO | Admitting: Internal Medicine

## 2020-05-08 ENCOUNTER — Other Ambulatory Visit: Payer: Self-pay

## 2020-05-08 VITALS — BP 136/84 | HR 61 | Temp 97.7°F | Resp 16 | Ht 65.0 in | Wt 192.4 lb

## 2020-05-08 DIAGNOSIS — E349 Endocrine disorder, unspecified: Secondary | ICD-10-CM

## 2020-05-08 DIAGNOSIS — I700 Atherosclerosis of aorta: Secondary | ICD-10-CM | POA: Diagnosis not present

## 2020-05-08 DIAGNOSIS — Z1389 Encounter for screening for other disorder: Secondary | ICD-10-CM | POA: Diagnosis not present

## 2020-05-08 DIAGNOSIS — N401 Enlarged prostate with lower urinary tract symptoms: Secondary | ICD-10-CM | POA: Diagnosis not present

## 2020-05-08 DIAGNOSIS — Z79899 Other long term (current) drug therapy: Secondary | ICD-10-CM

## 2020-05-08 DIAGNOSIS — G4733 Obstructive sleep apnea (adult) (pediatric): Secondary | ICD-10-CM

## 2020-05-08 DIAGNOSIS — Z13220 Encounter for screening for lipoid disorders: Secondary | ICD-10-CM

## 2020-05-08 DIAGNOSIS — E559 Vitamin D deficiency, unspecified: Secondary | ICD-10-CM | POA: Diagnosis not present

## 2020-05-08 DIAGNOSIS — R5383 Other fatigue: Secondary | ICD-10-CM

## 2020-05-08 DIAGNOSIS — Z125 Encounter for screening for malignant neoplasm of prostate: Secondary | ICD-10-CM | POA: Diagnosis not present

## 2020-05-08 DIAGNOSIS — Z136 Encounter for screening for cardiovascular disorders: Secondary | ICD-10-CM | POA: Diagnosis not present

## 2020-05-08 DIAGNOSIS — I10 Essential (primary) hypertension: Secondary | ICD-10-CM | POA: Diagnosis not present

## 2020-05-08 DIAGNOSIS — Z0000 Encounter for general adult medical examination without abnormal findings: Secondary | ICD-10-CM

## 2020-05-08 DIAGNOSIS — R350 Frequency of micturition: Secondary | ICD-10-CM | POA: Diagnosis not present

## 2020-05-08 DIAGNOSIS — Z111 Encounter for screening for respiratory tuberculosis: Secondary | ICD-10-CM

## 2020-05-08 DIAGNOSIS — Z131 Encounter for screening for diabetes mellitus: Secondary | ICD-10-CM | POA: Diagnosis not present

## 2020-05-08 DIAGNOSIS — Z1329 Encounter for screening for other suspected endocrine disorder: Secondary | ICD-10-CM

## 2020-05-08 DIAGNOSIS — E782 Mixed hyperlipidemia: Secondary | ICD-10-CM

## 2020-05-08 DIAGNOSIS — Z130 Encounter for screening for diseases of the blood and blood-forming organs and certain disorders involving the immune mechanism: Secondary | ICD-10-CM | POA: Diagnosis not present

## 2020-05-08 DIAGNOSIS — Z87891 Personal history of nicotine dependence: Secondary | ICD-10-CM

## 2020-05-08 DIAGNOSIS — Z8249 Family history of ischemic heart disease and other diseases of the circulatory system: Secondary | ICD-10-CM | POA: Diagnosis not present

## 2020-05-08 DIAGNOSIS — R7309 Other abnormal glucose: Secondary | ICD-10-CM

## 2020-05-08 DIAGNOSIS — Z0001 Encounter for general adult medical examination with abnormal findings: Secondary | ICD-10-CM

## 2020-05-08 DIAGNOSIS — Z9989 Dependence on other enabling machines and devices: Secondary | ICD-10-CM

## 2020-05-08 NOTE — Progress Notes (Signed)
AortaScan < 3 cm. Within normal limits, per Dr Mckeown. 

## 2020-05-09 LAB — COMPLETE METABOLIC PANEL WITH GFR
AG Ratio: 2.3 (calc) (ref 1.0–2.5)
ALT: 26 U/L (ref 9–46)
AST: 29 U/L (ref 10–35)
Albumin: 4.6 g/dL (ref 3.6–5.1)
Alkaline phosphatase (APISO): 49 U/L (ref 35–144)
BUN: 25 mg/dL (ref 7–25)
CO2: 25 mmol/L (ref 20–32)
Calcium: 9.5 mg/dL (ref 8.6–10.3)
Chloride: 105 mmol/L (ref 98–110)
Creat: 0.98 mg/dL (ref 0.70–1.25)
GFR, Est African American: 95 mL/min/{1.73_m2} (ref >=60)
GFR, Est Non African American: 82 mL/min/{1.73_m2} (ref >=60)
Globulin: 2 g/dL (calc) (ref 1.9–3.7)
Glucose, Bld: 92 mg/dL (ref 65–99)
Potassium: 4 mmol/L (ref 3.5–5.3)
Sodium: 138 mmol/L (ref 135–146)
Total Bilirubin: 0.7 mg/dL (ref 0.2–1.2)
Total Protein: 6.6 g/dL (ref 6.1–8.1)

## 2020-05-09 LAB — MAGNESIUM: Magnesium: 2.3 mg/dL (ref 1.5–2.5)

## 2020-05-09 LAB — URINALYSIS, ROUTINE W REFLEX MICROSCOPIC
Bilirubin Urine: NEGATIVE
Glucose, UA: NEGATIVE
Hgb urine dipstick: NEGATIVE
Ketones, ur: NEGATIVE
Leukocytes,Ua: NEGATIVE
Nitrite: NEGATIVE
Protein, ur: NEGATIVE
Specific Gravity, Urine: 1.025 (ref 1.001–1.03)
pH: 5.5 (ref 5.0–8.0)

## 2020-05-09 LAB — VITAMIN D 25 HYDROXY (VIT D DEFICIENCY, FRACTURES): Vit D, 25-Hydroxy: 97 ng/mL (ref 30–100)

## 2020-05-09 LAB — CBC WITH DIFFERENTIAL/PLATELET
Absolute Monocytes: 638 cells/uL (ref 200–950)
Basophils Absolute: 72 cells/uL (ref 0–200)
Basophils Relative: 1.3 %
Eosinophils Absolute: 99 cells/uL (ref 15–500)
Eosinophils Relative: 1.8 %
HCT: 45.8 % (ref 38.5–50.0)
Hemoglobin: 15 g/dL (ref 13.2–17.1)
Lymphs Abs: 1155 cells/uL (ref 850–3900)
MCH: 31.4 pg (ref 27.0–33.0)
MCHC: 32.8 g/dL (ref 32.0–36.0)
MCV: 96 fL (ref 80.0–100.0)
MPV: 11.3 fL (ref 7.5–12.5)
Monocytes Relative: 11.6 %
Neutro Abs: 3537 cells/uL (ref 1500–7800)
Neutrophils Relative %: 64.3 %
Platelets: 184 10*3/uL (ref 140–400)
RBC: 4.77 10*6/uL (ref 4.20–5.80)
RDW: 12 % (ref 11.0–15.0)
Total Lymphocyte: 21 %
WBC: 5.5 10*3/uL (ref 3.8–10.8)

## 2020-05-09 LAB — IRON, TOTAL/TOTAL IRON BINDING CAP
%SAT: 31 % (calc) (ref 20–48)
Iron: 95 ug/dL (ref 50–180)
TIBC: 307 mcg/dL (calc) (ref 250–425)

## 2020-05-09 LAB — TESTOSTERONE: Testosterone: 1436 ng/dL — ABNORMAL HIGH (ref 250–827)

## 2020-05-09 LAB — HEMOGLOBIN A1C
Hgb A1c MFr Bld: 5.3 % of total Hgb (ref <5.7)
Mean Plasma Glucose: 105 mg/dL
eAG (mmol/L): 5.8 mmol/L

## 2020-05-09 LAB — LIPID PANEL
Cholesterol: 111 mg/dL (ref <200)
HDL: 49 mg/dL (ref >=40)
LDL Cholesterol (Calc): 44 mg/dL (calc)
Non-HDL Cholesterol (Calc): 62 mg/dL (calc) (ref <130)
Total CHOL/HDL Ratio: 2.3 (calc) (ref <5.0)
Triglycerides: 99 mg/dL (ref <150)

## 2020-05-09 LAB — VITAMIN B12: Vitamin B-12: 591 pg/mL (ref 200–1100)

## 2020-05-09 LAB — INSULIN, RANDOM: Insulin: 7.3 u[IU]/mL

## 2020-05-09 LAB — PSA: PSA: 1.2 ng/mL (ref <=4.0)

## 2020-05-09 LAB — MICROALBUMIN / CREATININE URINE RATIO
Creatinine, Urine: 134 mg/dL (ref 20–320)
Microalb Creat Ratio: 18 mcg/mg creat (ref <30)
Microalb, Ur: 2.4 mg/dL

## 2020-05-09 LAB — TSH: TSH: 0.92 mIU/L (ref 0.40–4.50)

## 2020-05-09 NOTE — Progress Notes (Signed)
============================================================ -   Test results slightly outside the reference range are not unusual. If there is anything important, I will review this with you,  otherwise it is considered normal test values.  If you have further questions,  please do not hesitate to contact me at the office or via My Chart.  ============================================================ ============================================================  -  Iron  and Vitamin B12 levels - both Norma;l & OK  ============================================================ ============================================================  - PSA - very Low - Great ! ============================================================ ============================================================  - Testosterone Level high from recent injection ============================================================ ============================================================  - Total Chol = 111   and  LDL Chol = 44    -   Both  Excellent   - Very low risk for Heart Attack  / Stroke ============================================================ ============================================================  - A1c = 5.3% - Great  - No Diabetes   ! ============================================================ ============================================================  - Vitamin D = 97 - Excellent  ============================================================ ============================================================  - All Else - CBC - Kidneys  -  U/A  - Electrolytes - Liver - Magnesium & Thyroid    - all  Normal / OK ============================================================ ============================================================  - Keep up the [place] Work   ! ============================================================ ============================================================

## 2020-05-10 ENCOUNTER — Other Ambulatory Visit: Payer: Self-pay | Admitting: Internal Medicine

## 2020-05-10 MED ORDER — ATORVASTATIN CALCIUM 40 MG PO TABS: 40.0000 mg | ORAL_TABLET | Freq: Every day | ORAL | 1 refills | Status: DC

## 2020-05-10 MED ORDER — OLMESARTAN MEDOXOMIL 20 MG PO TABS: ORAL_TABLET | ORAL | 1 refills | Status: DC

## 2020-05-17 DIAGNOSIS — D485 Neoplasm of uncertain behavior of skin: Secondary | ICD-10-CM | POA: Diagnosis not present

## 2020-05-17 DIAGNOSIS — L578 Other skin changes due to chronic exposure to nonionizing radiation: Secondary | ICD-10-CM | POA: Diagnosis not present

## 2020-05-17 DIAGNOSIS — L821 Other seborrheic keratosis: Secondary | ICD-10-CM | POA: Diagnosis not present

## 2020-05-17 DIAGNOSIS — D2372 Other benign neoplasm of skin of left lower limb, including hip: Secondary | ICD-10-CM | POA: Diagnosis not present

## 2020-05-17 DIAGNOSIS — L570 Actinic keratosis: Secondary | ICD-10-CM | POA: Diagnosis not present

## 2020-05-17 DIAGNOSIS — D225 Melanocytic nevi of trunk: Secondary | ICD-10-CM | POA: Diagnosis not present

## 2020-05-17 DIAGNOSIS — C44719 Basal cell carcinoma of skin of left lower limb, including hip: Secondary | ICD-10-CM | POA: Diagnosis not present

## 2020-05-21 ENCOUNTER — Encounter: Payer: BC Managed Care – PPO | Admitting: Internal Medicine

## 2020-05-25 ENCOUNTER — Other Ambulatory Visit: Payer: Self-pay

## 2020-05-25 ENCOUNTER — Encounter: Payer: Self-pay | Admitting: Gastroenterology

## 2020-05-25 ENCOUNTER — Ambulatory Visit (AMBULATORY_SURGERY_CENTER): Payer: BC Managed Care – PPO | Admitting: Gastroenterology

## 2020-05-25 VITALS — BP 107/67 | HR 68 | Temp 97.1°F | Resp 19 | Ht 65.0 in | Wt 193.0 lb

## 2020-05-25 DIAGNOSIS — K3189 Other diseases of stomach and duodenum: Secondary | ICD-10-CM | POA: Diagnosis not present

## 2020-05-25 DIAGNOSIS — Z86010 Personal history of colonic polyps: Secondary | ICD-10-CM | POA: Diagnosis not present

## 2020-05-25 DIAGNOSIS — K2970 Gastritis, unspecified, without bleeding: Secondary | ICD-10-CM | POA: Diagnosis not present

## 2020-05-25 DIAGNOSIS — D125 Benign neoplasm of sigmoid colon: Secondary | ICD-10-CM | POA: Diagnosis not present

## 2020-05-25 DIAGNOSIS — R1013 Epigastric pain: Secondary | ICD-10-CM

## 2020-05-25 DIAGNOSIS — Z1211 Encounter for screening for malignant neoplasm of colon: Secondary | ICD-10-CM | POA: Diagnosis not present

## 2020-05-25 DIAGNOSIS — D123 Benign neoplasm of transverse colon: Secondary | ICD-10-CM | POA: Diagnosis not present

## 2020-05-25 DIAGNOSIS — K2100 Gastro-esophageal reflux disease with esophagitis, without bleeding: Secondary | ICD-10-CM | POA: Diagnosis not present

## 2020-05-25 DIAGNOSIS — K640 First degree hemorrhoids: Secondary | ICD-10-CM

## 2020-05-25 DIAGNOSIS — K219 Gastro-esophageal reflux disease without esophagitis: Secondary | ICD-10-CM | POA: Diagnosis not present

## 2020-05-25 DIAGNOSIS — K5730 Diverticulosis of large intestine without perforation or abscess without bleeding: Secondary | ICD-10-CM

## 2020-05-25 DIAGNOSIS — Z8719 Personal history of other diseases of the digestive system: Secondary | ICD-10-CM | POA: Diagnosis not present

## 2020-05-25 DIAGNOSIS — D122 Benign neoplasm of ascending colon: Secondary | ICD-10-CM | POA: Diagnosis not present

## 2020-05-25 DIAGNOSIS — K259 Gastric ulcer, unspecified as acute or chronic, without hemorrhage or perforation: Secondary | ICD-10-CM | POA: Diagnosis not present

## 2020-05-25 DIAGNOSIS — K2990 Gastroduodenitis, unspecified, without bleeding: Secondary | ICD-10-CM | POA: Diagnosis not present

## 2020-05-25 MED ORDER — SODIUM CHLORIDE 0.9 % IV SOLN: 500.0000 mL | Freq: Once | INTRAVENOUS | Status: DC

## 2020-05-25 MED ORDER — OMEPRAZOLE 40 MG PO CPDR: DELAYED_RELEASE_CAPSULE | ORAL | 1 refills | Status: DC

## 2020-05-25 NOTE — Patient Instructions (Addendum)
Handouts Provided:  Polyps, Diverticulosis and Gastritis  START taking Prilosec 40 mg twice daily for 8 weeks then reduce to 40 mg once daily.   YOU HAD AN ENDOSCOPIC PROCEDURE TODAY AT [facility]:   Refer to the procedure report that was given to you for any specific questions about what was found during the examination.  If the procedure report does not answer your questions, please call your gastroenterologist to clarify.  If you requested that your care partner not be given the details of your procedure findings, then the procedure report has been included in a sealed envelope for you to review at your convenience later.  YOU SHOULD EXPECT: Some feelings of bloating in the abdomen. Passage of more gas than usual.  Walking can help get rid of the air that was put into your GI tract during the procedure and reduce the bloating. If you had a lower endoscopy (such as a colonoscopy or flexible sigmoidoscopy) you may notice spotting of blood in your stool or on the toilet paper. If you underwent a bowel prep for your procedure, you may not have a normal bowel movement for a few days.  Please Note:  You might notice some irritation and congestion in your nose or some drainage.  This is from the oxygen used during your procedure.  There is no need for concern and it should clear up in a day or so.  SYMPTOMS TO REPORT IMMEDIATELY:   Following lower endoscopy (colonoscopy or flexible sigmoidoscopy):  Excessive amounts of blood in the stool  Significant tenderness or worsening of abdominal pains  Swelling of the abdomen that is new, acute  Fever of 100F or higher   Following upper endoscopy (EGD)  Vomiting of blood or coffee ground material  New chest pain or pain under the shoulder blades  Painful or persistently difficult swallowing  New shortness of breath  Fever of 100F or higher  Black, tarry-looking stools  For urgent or emergent issues, a gastroenterologist can be  reached at any hour by calling [phone]. Do not use MyChart messaging for urgent concerns.    DIET:  We do recommend a small meal at first, but then you may proceed to your regular diet.  Drink plenty of fluids but you should avoid alcoholic beverages for 24 hours.  ACTIVITY:  You should plan to take it easy for the rest of today and you should NOT DRIVE or use heavy machinery until tomorrow (because of the sedation medicines used during the test).    FOLLOW UP: Our staff will call the number listed on your records 48-72 hours following your procedure to check on you and address any questions or concerns that you may have regarding the information given to you following your procedure. If we do not reach you, we will leave a message.  We will attempt to reach you two times.  During this call, we will ask if you have developed any symptoms of COVID 19. If you develop any symptoms (ie: fever, flu-like symptoms, shortness of breath, cough etc.) before then, please call [phone].  If you test positive for Covid 19 in the 2 weeks post procedure, please call and report this information to [place].    If any biopsies were taken you will be contacted by phone or by letter within the next 1-3 weeks.  Please call us at [phone] if you have not heard about the biopsies in 3 weeks.    SIGNATURES/CONFIDENTIALITY: You and/or your  care partner have signed paperwork which will be entered into your electronic medical record.  These signatures attest to the fact that that the information above on your After Visit Summary has been reviewed and is understood.  Full responsibility of the confidentiality of this discharge information lies with you and/or your care-partner.

## 2020-05-25 NOTE — Op Note (Signed)
[facility] Patient Name: [name] Procedure Date: 05/25/2020 1:39 PM MRN: [id number] Endoscopist: Gerrit Heck , MD Age: 60 Referring MD:  Date of Birth: [birth year] Gender: Male Account #: [id number] Procedure:                Upper GI endoscopy Indications:              Epigastric abdominal pain Medicines:                Monitored Anesthesia Care Procedure:                Pre-Anesthesia Assessment:                           - Prior to the procedure, a History and Physical                            was performed, and patient medications and                            allergies were reviewed. The patient's tolerance of                            previous anesthesia was also reviewed. The risks                            and benefits of the procedure and the sedation                            options and risks were discussed with the patient.                            All questions were answered, and informed consent                            was obtained. Prior Anticoagulants: The patient has                            taken no previous anticoagulant or antiplatelet                            agents. ASA Grade Assessment: II - A patient with                            mild systemic disease. After reviewing the risks                            and benefits, the patient was deemed in                            satisfactory condition to undergo the procedure.                           After obtaining informed consent, the endoscope was  passed under direct vision. Throughout the                            procedure, the patient's blood pressure, pulse, and                            oxygen saturations were monitored continuously. The                            Endoscope was introduced through the mouth, and                            advanced to the third part of duodenum. The upper                            GI endoscopy was accomplished  without difficulty.                            The patient tolerated the procedure well. Scope In: Scope Out: Findings:                 LA Grade C (one or more mucosal breaks continuous                            between tops of 2 or more mucosal folds, less than                            75% circumference) esophagitis with no bleeding was                            found in the lower third of the esophagus. There                            was a small, non-bleeding ulcer in the distal                            esophagus. Biopsies were taken with a cold forceps                            for histology. Estimated blood loss was minimal.                           The upper third of the esophagus and middle third                            of the esophagus were normal.                           Scattered mild inflammation characterized by                            congestion (edema) and erythema was found in the  gastric fundus, in the gastric body and in the                            gastric antrum. Biopsies were taken with a cold                            forceps for Helicobacter pylori testing. Estimated                            blood loss was minimal.                           The duodenal bulb and third portion of the duodenum                            were normal.                           Localized mildly erythematous mucosa without active                            bleeding was found in the second portion of the                            duodenum. Biopsies were taken with a cold forceps                            for histology. Estimated blood loss was minimal. Complications:            No immediate complications. Estimated Blood Loss:     Estimated blood loss was minimal. Impression:               - LA Grade C reflux esophagitis with no bleeding.                            Biopsied.                           - Normal upper third of esophagus and  middle third                            of esophagus.                           - Gastritis. Biopsied.                           - Normal duodenal bulb and third portion of the                            duodenum.                           - Erythematous duodenopathy. Biopsied. Recommendation:           - Patient has a contact number available for  emergencies. The signs and symptoms of potential                            delayed complications were discussed with the                            patient. Return to normal activities tomorrow.                            Written discharge instructions were provided to the                            patient.                           - Resume previous diet.                           - Continue present medications.                           - Await pathology results.                           - Start Prilosec (omeprazole) 40 mg PO BID for 8                            weeks to promote mucosal healing, then reduce to 40                            mg daily and will plan to continue titrating to                            lowest effective dose.                           - Colonoscopy today. Gerrit Heck, MD 05/25/2020 2:29:06 PM

## 2020-05-25 NOTE — Progress Notes (Signed)
1345 Robinul 0.1 mg IV given due large amount of secretions upon assessment.  MD made aware, vss

## 2020-05-25 NOTE — Progress Notes (Signed)
Medical history reviewed, VS assessed by C.W 

## 2020-05-25 NOTE — Progress Notes (Signed)
Called to room to assist during endoscopic procedure.  Patient ID and intended procedure confirmed with present staff. Received instructions for my participation in the procedure from the performing physician.  

## 2020-05-25 NOTE — Progress Notes (Signed)
Report given to PACU, vss 

## 2020-05-25 NOTE — Op Note (Signed)
[facility] Patient Name: [name] Procedure Date: 05/25/2020 1:39 PM MRN: [id number] Endoscopist: Gerrit Heck , MD Age: 60 Referring MD:  Date of Birth: [birth year] Gender: Male Account #: [id number] Procedure:                Colonoscopy Indications:              Surveillance: Personal history of adenomatous                            polyps on last colonoscopy 3 years ago, Incidental                            - Follow-up of diverticulitis                           Last colonoscopy was in 05/2017 and notable for                            multiple adenomatous polyps, with recommendation to                            repeat in 3 years for ongoing surveillance. Did                            have a recent episode of acute diverticulitis in                            03/2020, treated with PO Cipro/Flagyl with                            resolution. Medicines:                Monitored Anesthesia Care Procedure:                Pre-Anesthesia Assessment:                           - Prior to the procedure, a History and Physical                            was performed, and patient medications and                            allergies were reviewed. The patient's tolerance of                            previous anesthesia was also reviewed. The risks                            and benefits of the procedure and the sedation                            options and risks were discussed with the patient.  All questions were answered, and informed consent                            was obtained. Prior Anticoagulants: The patient has                            taken no previous anticoagulant or antiplatelet                            agents. ASA Grade Assessment: II - A patient with                            mild systemic disease. After reviewing the risks                            and benefits, the patient was deemed in                             satisfactory condition to undergo the procedure.                           After obtaining informed consent, the colonoscope                            was passed under direct vision. Throughout the                            procedure, the patient's blood pressure, pulse, and                            oxygen saturations were monitored continuously. The                            Olympus CF-[id number]) Colonoscope was                            introduced through the anus and advanced to the the                            terminal ileum. The colonoscopy was performed                            without difficulty. The patient tolerated the                            procedure well. The quality of the bowel                            preparation was good. The terminal ileum, ileocecal                            valve, appendiceal orifice, and rectum were  photographed. Scope In: 1:59:45 PM Scope Out: 2:21:32 PM Scope Withdrawal Time: 0 hours 18 minutes 26 seconds  Total Procedure Duration: 0 hours 21 minutes 47 seconds  Findings:                 The perianal and digital rectal examinations were                            normal.                           Five sessile polyps were found in the sigmoid                            colon, transverse colon (3), and ascending colon.                            The polyps were 2 to 5 mm in size. These polyps                            were removed with a cold snare. Resection and                            retrieval were complete. Estimated blood loss was                            minimal.                           There was a medium-sized lipoma, in the ascending                            colon.                           Multiple small and large-mouthed diverticula were                            found in the sigmoid colon. No areas of mucosal                            erythema, edema, erosions, or ulceration, and  no                            luminal narrowing noted on this study.                           Non-bleeding internal hemorrhoids were found during                            retroflexion. The hemorrhoids were small.                           The terminal ileum appeared normal. Complications:            No immediate complications. Estimated Blood Loss:     Estimated  blood loss was minimal. Impression:               - Five 2 to 5 mm polyps in the sigmoid colon, in                            the transverse colon and in the ascending colon,                            removed with a cold snare. Resected and retrieved.                           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                           - Diverticulosis in the sigmoid colon.                           - Non-bleeding internal hemorrhoids.                           - The examined portion of the ileum was normal. Recommendation:           - Patient has a contact number available for                            emergencies. The signs and symptoms of potential                            delayed complications were discussed with the                            patient. Return to normal activities tomorrow.                            Written discharge instructions were provided to the                            patient.                           - Resume previous diet.                           - Continue present medications.                           - Await pathology results.                           - Repeat colonoscopy in 3 years for surveillance.                           - Return to GI office PRN. Gerrit Heck, MD 05/25/2020 2:35:07 PM

## 2020-05-29 ENCOUNTER — Telehealth: Payer: Self-pay | Admitting: *Deleted

## 2020-05-29 NOTE — Telephone Encounter (Signed)
  Follow up Call-  Call back number 05/25/2020  Post procedure Call Back phone  # [phone]  Permission to leave phone message Yes  Some recent data might be hidden     Patient questions:  Do you have a fever, pain , or abdominal swelling? No. Pain Score  0 *  Have you tolerated food without any problems? Yes.    Have you been able to return to your normal activities? Yes.    Do you have any questions about your discharge instructions: Diet   No. Medications  No. Follow up visit  No.  Do you have questions or concerns about your Care? No.  Actions: * If pain score is 4 or above: 1. No action needed, pain <4.Have you developed a fever since your procedure? no  2.   Have you had an respiratory symptoms (SOB or cough) since your procedure? no  3.   Have you tested positive for COVID 19 since your procedure no  4.   Have you had any family members/close contacts diagnosed with the COVID 19 since your procedure?  no   If yes to any of these questions please route to Joylene John, RN and Joella Prince, RN

## 2020-05-30 ENCOUNTER — Other Ambulatory Visit: Payer: Self-pay | Admitting: Internal Medicine

## 2020-05-30 DIAGNOSIS — R0989 Other specified symptoms and signs involving the circulatory and respiratory systems: Secondary | ICD-10-CM

## 2020-05-30 MED ORDER — AMLODIPINE BESYLATE 10 MG PO TABS: ORAL_TABLET | ORAL | 3 refills | Status: DC

## 2020-06-06 ENCOUNTER — Other Ambulatory Visit: Payer: Self-pay | Admitting: *Deleted

## 2020-06-06 ENCOUNTER — Encounter: Payer: Self-pay | Admitting: Gastroenterology

## 2020-06-06 MED ORDER — EZETIMIBE 10 MG PO TABS: 5.0000 mg | ORAL_TABLET | Freq: Every day | ORAL | 1 refills | Status: DC

## 2020-06-08 DIAGNOSIS — C44719 Basal cell carcinoma of skin of left lower limb, including hip: Secondary | ICD-10-CM | POA: Diagnosis not present

## 2020-06-26 ENCOUNTER — Other Ambulatory Visit: Payer: Self-pay | Admitting: Internal Medicine

## 2020-06-26 DIAGNOSIS — N401 Enlarged prostate with lower urinary tract symptoms: Secondary | ICD-10-CM

## 2020-06-26 DIAGNOSIS — N138 Other obstructive and reflux uropathy: Secondary | ICD-10-CM

## 2020-06-26 MED ORDER — FINASTERIDE 5 MG PO TABS
ORAL_TABLET | ORAL | 3 refills | Status: DC
Start: 2020-06-26 — End: 2021-09-18

## 2020-07-03 DIAGNOSIS — Z880 Allergy status to penicillin: Secondary | ICD-10-CM | POA: Diagnosis not present

## 2020-07-03 DIAGNOSIS — M25561 Pain in right knee: Secondary | ICD-10-CM | POA: Diagnosis not present

## 2020-07-03 DIAGNOSIS — Z881 Allergy status to other antibiotic agents status: Secondary | ICD-10-CM | POA: Diagnosis not present

## 2020-07-03 DIAGNOSIS — Z888 Allergy status to other drugs, medicaments and biological substances status: Secondary | ICD-10-CM | POA: Diagnosis not present

## 2020-07-03 DIAGNOSIS — Z471 Aftercare following joint replacement surgery: Secondary | ICD-10-CM | POA: Diagnosis not present

## 2020-07-03 DIAGNOSIS — Z96651 Presence of right artificial knee joint: Secondary | ICD-10-CM | POA: Diagnosis not present

## 2020-07-03 DIAGNOSIS — Y792 Prosthetic and other implants, materials and accessory orthopedic devices associated with adverse incidents: Secondary | ICD-10-CM | POA: Diagnosis not present

## 2020-07-03 DIAGNOSIS — T8484XA Pain due to internal orthopedic prosthetic devices, implants and grafts, initial encounter: Secondary | ICD-10-CM | POA: Diagnosis not present

## 2020-07-04 ENCOUNTER — Other Ambulatory Visit: Payer: Self-pay | Admitting: Internal Medicine

## 2020-07-04 DIAGNOSIS — N401 Enlarged prostate with lower urinary tract symptoms: Secondary | ICD-10-CM

## 2020-07-04 DIAGNOSIS — N138 Other obstructive and reflux uropathy: Secondary | ICD-10-CM

## 2020-07-13 DIAGNOSIS — M25561 Pain in right knee: Secondary | ICD-10-CM | POA: Diagnosis not present

## 2020-07-13 DIAGNOSIS — Z96651 Presence of right artificial knee joint: Secondary | ICD-10-CM | POA: Diagnosis not present

## 2020-07-13 DIAGNOSIS — T8484XA Pain due to internal orthopedic prosthetic devices, implants and grafts, initial encounter: Secondary | ICD-10-CM | POA: Diagnosis not present

## 2020-07-19 DIAGNOSIS — G4733 Obstructive sleep apnea (adult) (pediatric): Secondary | ICD-10-CM | POA: Diagnosis not present

## 2020-07-26 DIAGNOSIS — Z96651 Presence of right artificial knee joint: Secondary | ICD-10-CM | POA: Diagnosis not present

## 2020-07-26 DIAGNOSIS — T84032A Mechanical loosening of internal right knee prosthetic joint, initial encounter: Secondary | ICD-10-CM | POA: Diagnosis not present

## 2020-07-31 ENCOUNTER — Other Ambulatory Visit: Payer: Self-pay | Admitting: Internal Medicine

## 2020-07-31 DIAGNOSIS — E782 Mixed hyperlipidemia: Secondary | ICD-10-CM

## 2020-07-31 MED ORDER — ATORVASTATIN CALCIUM 40 MG PO TABS: ORAL_TABLET | ORAL | 3 refills | Status: DC

## 2020-09-13 DIAGNOSIS — H35033 Hypertensive retinopathy, bilateral: Secondary | ICD-10-CM | POA: Diagnosis not present

## 2020-09-13 DIAGNOSIS — I10 Essential (primary) hypertension: Secondary | ICD-10-CM | POA: Diagnosis not present

## 2020-09-13 DIAGNOSIS — H5203 Hypermetropia, bilateral: Secondary | ICD-10-CM | POA: Diagnosis not present

## 2020-09-13 DIAGNOSIS — H2513 Age-related nuclear cataract, bilateral: Secondary | ICD-10-CM | POA: Diagnosis not present

## 2020-09-18 ENCOUNTER — Other Ambulatory Visit: Payer: Self-pay | Admitting: Internal Medicine

## 2020-09-18 DIAGNOSIS — F411 Generalized anxiety disorder: Secondary | ICD-10-CM

## 2020-09-18 MED ORDER — CLONAZEPAM 1 MG PO TABS: ORAL_TABLET | ORAL | 0 refills | Status: DC

## 2020-10-11 DIAGNOSIS — T84032D Mechanical loosening of internal right knee prosthetic joint, subsequent encounter: Secondary | ICD-10-CM | POA: Diagnosis not present

## 2020-10-11 DIAGNOSIS — M25461 Effusion, right knee: Secondary | ICD-10-CM | POA: Diagnosis not present

## 2020-10-11 DIAGNOSIS — Z96651 Presence of right artificial knee joint: Secondary | ICD-10-CM | POA: Diagnosis not present

## 2020-10-11 DIAGNOSIS — Z471 Aftercare following joint replacement surgery: Secondary | ICD-10-CM | POA: Diagnosis not present

## 2020-10-11 DIAGNOSIS — X58XXXD Exposure to other specified factors, subsequent encounter: Secondary | ICD-10-CM | POA: Diagnosis not present

## 2020-11-14 ENCOUNTER — Ambulatory Visit (INDEPENDENT_AMBULATORY_CARE_PROVIDER_SITE_OTHER): Payer: BC Managed Care – PPO | Admitting: Internal Medicine

## 2020-11-14 ENCOUNTER — Other Ambulatory Visit: Payer: Self-pay

## 2020-11-14 ENCOUNTER — Encounter: Payer: Self-pay | Admitting: Internal Medicine

## 2020-11-14 VITALS — BP 136/86 | HR 96 | Temp 98.4°F | Resp 16 | Ht 65.0 in | Wt 191.4 lb

## 2020-11-14 DIAGNOSIS — Z79899 Other long term (current) drug therapy: Secondary | ICD-10-CM

## 2020-11-14 DIAGNOSIS — E782 Mixed hyperlipidemia: Secondary | ICD-10-CM

## 2020-11-14 DIAGNOSIS — I10 Essential (primary) hypertension: Secondary | ICD-10-CM

## 2020-11-14 DIAGNOSIS — E349 Endocrine disorder, unspecified: Secondary | ICD-10-CM

## 2020-11-14 DIAGNOSIS — E559 Vitamin D deficiency, unspecified: Secondary | ICD-10-CM | POA: Diagnosis not present

## 2020-11-14 DIAGNOSIS — R7309 Other abnormal glucose: Secondary | ICD-10-CM | POA: Diagnosis not present

## 2020-11-14 DIAGNOSIS — G4733 Obstructive sleep apnea (adult) (pediatric): Secondary | ICD-10-CM

## 2020-11-14 DIAGNOSIS — Z9989 Dependence on other enabling machines and devices: Secondary | ICD-10-CM

## 2020-11-14 NOTE — Patient Instructions (Signed)
Due to recent changes in healthcare laws, you may see the results of your imaging and laboratory studies on MyChart before your provider has had a chance to review them.  We understand that in some cases there may be results that are confusing or concerning to you. Not all laboratory results come back in the same time frame and the provider may be waiting for multiple results in order to interpret others.  Please give us 48 hours in order for your provider to thoroughly review all the results before contacting the office for clarification of your results.  °++++++++++++++++++++++++++++++++++ ° Vit D  & °Vit C 1,000 mg   °are recommended to help protect  °against the Covid-19 and other Corona viruses.  ° ° Also it's recommended  °to take  °Zinc 50 mg  °to help  °protect against the Covid-19   °and best place to get ° is also on [employer]  °and don't pay more than 6-8 cents /pill !  ° °===================================== °Coronavirus (COVID-19) Are you at risk? ° °Are you at risk for the Coronavirus (COVID-19)? ° °To be considered HIGH RISK for Coronavirus (COVID-19), you have to meet the following criteria: ° °Traveled to [place], [place], [place], [place] or [place]; or in the [place] to [place], [place], [place]  °or [place]; and have fever, cough, and shortness of breath within the last 2 weeks of travel OR °Been in close contact with a person diagnosed with COVID-19 within the last 2 weeks and have  °fever, cough,and shortness of breath ° °IF YOU DO NOT MEET THESE CRITERIA, YOU ARE CONSIDERED LOW RISK FOR COVID-19. ° °What to do if you are HIGH RISK for COVID-19? ° °If you are having a medical emergency, call 911. °Seek medical care right away. Before you go to a doctor’s office, urgent care or emergency department, ° call ahead and tell them about your recent travel, contact with someone diagnosed with COVID-19  ° and your symptoms.  °You should receive instructions from your physician’s office  regarding next steps of care.  °When you arrive at healthcare provider, tell the healthcare staff immediately you have returned from  °visiting [place], [place], [place], [place] or [place]; or traveled in the [place] to [place], [place],  °[place] or [place] in the last two weeks or you have been in close contact with a person diagnosed with  °COVID-19 in the last 2 weeks.   °Tell the health care staff about your symptoms: fever, cough and shortness of breath. °After you have been seen by a medical provider, you will be either: °Tested for (COVID-19) and discharged home on quarantine except to seek medical care if  °symptoms worsen, and asked to  °Stay home and avoid contact with others until you get your results (4-5 days)  °Avoid travel on public transportation if possible (such as bus, train, or airplane) or °Sent to the Emergency Department by EMS for evaluation, COVID-19 testing  and  °possible admission depending on your condition and test results. ° °What to do if you are LOW RISK for COVID-19? ° °Reduce your risk of any infection by using the same precautions used for avoiding the common cold or flu:  °Wash your hands often with soap and warm water for at least 20 seconds.  If soap and water are not readily available,  °use an alcohol-based hand sanitizer with at least 60% alcohol.  °If coughing or sneezing, cover your mouth and nose by coughing   or sneezing into the elbow areas of your shirt or coat, ° into a tissue or into your sleeve (not your hands). °Avoid shaking hands with others and consider head nods or verbal greetings only. °Avoid touching your eyes, nose, or mouth with unwashed hands.  °Avoid close contact with people who are sick. °Avoid places or events with large numbers of people in one location, like concerts or sporting events. °Carefully consider travel plans you have or are making. °If you are planning any travel outside or inside the [place], visit the CDC’s Travelers’ Health  webpage for the latest health notices. °If you have some symptoms but not all symptoms, continue to monitor at home and seek medical attention  °if your symptoms worsen. °If you are having a medical emergency, call 911. ° ° °++++++++++++++++++++++++++++++++ °Recommend Adult Low Dose Aspirin or  °coated  Aspirin 81 mg daily  °To reduce risk of Colon Cancer 40 %, ° Skin Cancer 26 % ,  °Melanoma 46% ° and  °Pancreatic cancer 60% °++++++++++++++++++++++++++++++++ °Vitamin D goal ° is between 70-100.  °Please make sure that you are taking your Vitamin D as directed.  °It is very important as a natural anti-inflammatory  °helping hair, skin, and nails, as well as reducing stroke and heart attack risk.  °It helps your bones and helps with mood. °It also decreases numerous cancer risks so please take it as directed.  °Low Vit D is associated with a 200-300% higher risk for CANCER  °and 200-300% higher risk for HEART   ATTACK  &  STROKE.   °...................................... °It is also associated with higher death rate at younger ages,  °autoimmune diseases like Rheumatoid arthritis, Lupus, Multiple Sclerosis.    °Also many other serious conditions, like depression, Alzheimer's °Dementia, infertility, muscle aches, fatigue, fibromyalgia - just to name a few. °++++++++++++++++++++ °Recommend the book "The END of DIETING" by Dr Alastor Fuhrman  °& the book "The END of DIABETES " by Dr Lenn Fuhrman °At [employer] - get book & Audio CD's  °  Being diabetic has a  300% increased risk for heart attack, stroke, cancer, and alzheimer- type vascular dementia. It is very important that you work harder with diet by avoiding all foods that are white. Avoid white rice (brown & wild rice is OK), white potatoes (sweetpotatoes in moderation is OK), White bread or wheat bread or anything made out of white flour like bagels, donuts, rolls, buns, biscuits, cakes, pastries, cookies, pizza crust, and pasta (made from white flour & egg whites)  - vegetarian pasta or spinach or wheat pasta is OK. Multigrain breads like Arnold's or [employer], or multigrain sandwich thins or flatbreads.  Diet, exercise and weight loss can reverse and cure diabetes in the early stages.  Diet, exercise and weight loss is very important in the control and prevention of complications of diabetes which affects every system in your body, ie. Brain - dementia/stroke, eyes - glaucoma/blindness, heart - heart attack/heart failure, kidneys - dialysis, stomach - gastric paralysis, intestines - malabsorption, nerves - severe painful neuritis, circulation - gangrene & loss of a leg(s), and finally cancer and Alzheimers. ° °  I recommend avoid fried & greasy foods,  sweets/candy, white rice (brown or wild rice or Quinoa is OK), white potatoes (sweet potatoes are OK) - anything made from white flour - bagels, doughnuts, rolls, buns, biscuits,white and wheat breads, pizza crust and traditional pasta made of white flour & egg white(vegetarian pasta or spinach or wheat pasta is OK).    Multi-grain bread is OK - like multi-grain flat bread or sandwich thins. Avoid alcohol in excess. Exercise is also important. ° °  Eat all the vegetables you want - avoid meat, especially red meat and dairy - especially cheese.  Cheese is the most concentrated form of trans-fats which is the worst thing to clog up our arteries. Veggie cheese is OK which can be found in the fresh produce section at [employer] or [employer] or [employer] ° °+++++++++++++++++++++ °DASH Eating Plan ° °DASH stands for "Dietary Approaches to Stop Hypertension."  ° °The DASH eating plan is a healthy eating plan that has been shown to reduce high blood pressure (hypertension). Additional health benefits may include reducing the risk of type 2 diabetes mellitus, heart disease, and stroke. The DASH eating plan may also help with weight loss. °WHAT DO I NEED TO KNOW ABOUT THE DASH EATING PLAN? °For the DASH eating plan, you will  follow these general guidelines: °Choose foods with a percent daily value for sodium of less than 5% (as listed on the food label). °Use salt-free seasonings or herbs instead of table salt or sea salt. °Check with your health care provider or pharmacist before using salt substitutes. °Eat lower-sodium products, often labeled as "lower sodium" or "no salt added." °Eat fresh foods. °Eat more vegetables, fruits, and low-fat dairy products. °Choose whole grains. Look for the word "whole" as the first word in the ingredient list. °Choose fish  °Limit sweets, desserts, sugars, and sugary drinks. °Choose heart-healthy fats. °Eat veggie cheese  °Eat more home-cooked food and less restaurant, buffet, and fast food. °Limit fried foods. °Cook foods using methods other than frying. °Limit canned vegetables. If you do use them, rinse them well to decrease the sodium. °When eating at a restaurant, ask that your food be prepared with less salt, or no salt if possible. °                  °   WHAT FOODS CAN I EAT? °Read Dr Elizah Fuhrman's books on The End of Dieting & The End of Diabetes ° °Grains °Whole grain or whole wheat bread. Brown rice. Whole grain or whole wheat pasta. Quinoa, bulgur, and whole grain cereals. Low-sodium cereals. Corn or whole wheat flour tortillas. Whole grain cornbread. Whole grain crackers. Low-sodium crackers. ° °Vegetables °Fresh or frozen vegetables (raw, steamed, roasted, or grilled). Low-sodium or reduced-sodium tomato and vegetable juices. Low-sodium or reduced-sodium tomato sauce and paste. Low-sodium or reduced-sodium canned vegetables.  ° °Fruits °All fresh, canned (in natural juice), or frozen fruits. ° °Protein Products ° All fish and seafood.  Dried beans, peas, or lentils. Unsalted nuts and seeds. Unsalted canned beans. ° °Dairy °Low-fat dairy products, such as skim or 1% milk, 2% or reduced-fat cheeses, low-fat ricotta or cottage cheese, or plain low-fat yogurt. Low-sodium or reduced-sodium  cheeses. ° °Fats and Oils °Tub margarines without trans fats. Light or reduced-fat mayonnaise and salad dressings (reduced sodium). Avocado. Safflower, olive, or canola oils. Natural peanut or almond butter. ° °Other °Unsalted popcorn and pretzels. °The items listed above may not be a complete list of recommended foods or beverages. Contact your dietitian for more options. ° °+++++++++++++++ ° °WHAT FOODS ARE NOT RECOMMENDED? °Grains/ White flour or wheat flour °White bread. White pasta. White rice. Refined cornbread. Bagels and croissants. Crackers that contain trans fat. ° °Vegetables ° °Creamed or fried vegetables. Vegetables in a . Regular canned vegetables. Regular canned tomato sauce and paste. Regular tomato and vegetable juices. ° °  Fruits °Dried fruits. Canned fruit in light or heavy syrup. Fruit juice. ° °Meat and Other Protein Products °Meat in general - RED meat & White meat.  Fatty cuts of meat. Ribs, chicken wings, all processed meats as bacon, sausage, bologna, salami, fatback, hot dogs, bratwurst and packaged luncheon meats. ° °Dairy °Whole or 2% milk, cream, half-and-half, and cream cheese. Whole-fat or sweetened yogurt. Full-fat cheeses or blue cheese. Non-dairy creamers and whipped toppings. Processed cheese, cheese spreads, or cheese curds. ° °Condiments °Onion and garlic salt, seasoned salt, table salt, and sea salt. Canned and packaged gravies. Worcestershire sauce. Tartar sauce. Barbecue sauce. Teriyaki sauce. Soy sauce, including reduced sodium. Steak sauce. Fish sauce. Oyster sauce. Cocktail sauce. Horseradish. Ketchup and mustard. Meat flavorings and tenderizers. Bouillon cubes. Hot sauce. Tabasco sauce. Marinades. Taco seasonings. Relishes. ° °Fats and Oils °Butter, stick margarine, lard, shortening and bacon fat. Coconut, palm kernel, or palm oils. Regular salad dressings. ° °Pickles and olives. Salted popcorn and pretzels. ° °The items listed above may not be a complete list of foods and  beverages to avoid. ° °
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
## 2020-11-15 LAB — CBC WITH DIFFERENTIAL/PLATELET
Absolute Monocytes: 780 cells/uL (ref 200–950)
Basophils Absolute: 90 cells/uL (ref 0–200)
Basophils Relative: 1.2 %
Eosinophils Absolute: 38 cells/uL (ref 15–500)
Eosinophils Relative: 0.5 %
HCT: 49.6 % (ref 38.5–50.0)
Hemoglobin: 16.7 g/dL (ref 13.2–17.1)
Lymphs Abs: 1193 cells/uL (ref 850–3900)
MCH: 32.1 pg (ref 27.0–33.0)
MCHC: 33.7 g/dL (ref 32.0–36.0)
MCV: 95.2 fL (ref 80.0–100.0)
MPV: 11.2 fL (ref 7.5–12.5)
Monocytes Relative: 10.4 %
Neutro Abs: 5400 cells/uL (ref 1500–7800)
Neutrophils Relative %: 72 %
Platelets: 198 10*3/uL (ref 140–400)
RBC: 5.21 10*6/uL (ref 4.20–5.80)
RDW: 12.5 % (ref 11.0–15.0)
Total Lymphocyte: 15.9 %
WBC: 7.5 10*3/uL (ref 3.8–10.8)

## 2020-11-15 LAB — COMPLETE METABOLIC PANEL WITH GFR
AG Ratio: 2 (calc) (ref 1.0–2.5)
ALT: 27 U/L (ref 9–46)
AST: 29 U/L (ref 10–35)
Albumin: 4.7 g/dL (ref 3.6–5.1)
Alkaline phosphatase (APISO): 56 U/L (ref 35–144)
BUN: 24 mg/dL (ref 7–25)
CO2: 27 mmol/L (ref 20–32)
Calcium: 10.5 mg/dL — ABNORMAL HIGH (ref 8.6–10.3)
Chloride: 105 mmol/L (ref 98–110)
Creat: 1.04 mg/dL (ref 0.70–1.35)
Globulin: 2.3 g/dL (calc) (ref 1.9–3.7)
Glucose, Bld: 83 mg/dL (ref 65–99)
Potassium: 4.2 mmol/L (ref 3.5–5.3)
Sodium: 140 mmol/L (ref 135–146)
Total Bilirubin: 0.9 mg/dL (ref 0.2–1.2)
Total Protein: 7 g/dL (ref 6.1–8.1)
eGFR: 81 mL/min/{1.73_m2} (ref >=60)

## 2020-11-15 LAB — LIPID PANEL
Cholesterol: 126 mg/dL (ref <200)
HDL: 62 mg/dL (ref >=40)
LDL Cholesterol (Calc): 46 mg/dL (calc)
Non-HDL Cholesterol (Calc): 64 mg/dL (calc) (ref <130)
Total CHOL/HDL Ratio: 2 (calc) (ref <5.0)
Triglycerides: 98 mg/dL (ref <150)

## 2020-11-15 LAB — HEMOGLOBIN A1C
Hgb A1c MFr Bld: 5.2 % of total Hgb (ref <5.7)
Mean Plasma Glucose: 103 mg/dL
eAG (mmol/L): 5.7 mmol/L

## 2020-11-15 LAB — MAGNESIUM: Magnesium: 2.1 mg/dL (ref 1.5–2.5)

## 2020-11-15 LAB — VITAMIN D 25 HYDROXY (VIT D DEFICIENCY, FRACTURES): Vit D, 25-Hydroxy: 99 ng/mL (ref 30–100)

## 2020-11-15 LAB — TSH: TSH: 1.24 mIU/L (ref 0.40–4.50)

## 2020-11-15 LAB — TESTOSTERONE: Testosterone: 1251 ng/dL — ABNORMAL HIGH (ref 250–827)

## 2020-11-15 LAB — INSULIN, RANDOM: Insulin: 7.9 u[IU]/mL

## 2020-11-15 NOTE — Progress Notes (Signed)
============================================================ -   Test results slightly outside the reference range are not unusual. If there is anything important, I will review this with you,  otherwise it is considered normal test values.  If you have further questions,  please do not hesitate to contact me at the office or via My Chart.  ============================================================ ============================================================  -  Total Chol = 126    &  LDL Chol = 46  - Both  Excellent as usual for you !  - Very low risk for Heart Attack  / Stroke ============================================================ ============================================================  -  A1c - Normal -No Diabetes - Great  ! ============================================================ ============================================================  - Vitamin D =99 - Excellent - Please keep dose same  ============================================================ ============================================================  - Testosterone elevated from recent injection - ============================================================ ============================================================  - All Else - CBC - Kidneys - Electrolytes - Liver - Magnesium & Thyroid    - all  Normal / OK ============================================================ ============================================================  - Keep up the [place] Work  ! ============================================================ ============================================================

## 2020-12-03 DIAGNOSIS — M1711 Unilateral primary osteoarthritis, right knee: Secondary | ICD-10-CM | POA: Diagnosis not present

## 2020-12-03 DIAGNOSIS — G4733 Obstructive sleep apnea (adult) (pediatric): Secondary | ICD-10-CM | POA: Diagnosis not present

## 2020-12-03 DIAGNOSIS — E782 Mixed hyperlipidemia: Secondary | ICD-10-CM | POA: Diagnosis not present

## 2020-12-03 DIAGNOSIS — Z96651 Presence of right artificial knee joint: Secondary | ICD-10-CM | POA: Diagnosis not present

## 2020-12-03 DIAGNOSIS — N400 Enlarged prostate without lower urinary tract symptoms: Secondary | ICD-10-CM | POA: Diagnosis not present

## 2020-12-03 DIAGNOSIS — E669 Obesity, unspecified: Secondary | ICD-10-CM | POA: Diagnosis not present

## 2020-12-03 DIAGNOSIS — I10 Essential (primary) hypertension: Secondary | ICD-10-CM | POA: Diagnosis not present

## 2020-12-03 DIAGNOSIS — Y792 Prosthetic and other implants, materials and accessory orthopedic devices associated with adverse incidents: Secondary | ICD-10-CM | POA: Diagnosis not present

## 2020-12-03 DIAGNOSIS — G8929 Other chronic pain: Secondary | ICD-10-CM | POA: Diagnosis not present

## 2020-12-03 DIAGNOSIS — Z6831 Body mass index (BMI) 31.0-31.9, adult: Secondary | ICD-10-CM | POA: Diagnosis not present

## 2020-12-03 DIAGNOSIS — T84032A Mechanical loosening of internal right knee prosthetic joint, initial encounter: Secondary | ICD-10-CM | POA: Diagnosis not present

## 2020-12-03 DIAGNOSIS — N529 Male erectile dysfunction, unspecified: Secondary | ICD-10-CM | POA: Diagnosis not present

## 2020-12-03 DIAGNOSIS — Z87891 Personal history of nicotine dependence: Secondary | ICD-10-CM | POA: Diagnosis not present

## 2020-12-03 DIAGNOSIS — Z471 Aftercare following joint replacement surgery: Secondary | ICD-10-CM | POA: Diagnosis not present

## 2020-12-03 DIAGNOSIS — K589 Irritable bowel syndrome without diarrhea: Secondary | ICD-10-CM | POA: Diagnosis not present

## 2020-12-07 DIAGNOSIS — Z09 Encounter for follow-up examination after completed treatment for conditions other than malignant neoplasm: Secondary | ICD-10-CM | POA: Diagnosis not present

## 2020-12-07 DIAGNOSIS — N400 Enlarged prostate without lower urinary tract symptoms: Secondary | ICD-10-CM | POA: Diagnosis not present

## 2020-12-07 DIAGNOSIS — Z978 Presence of other specified devices: Secondary | ICD-10-CM | POA: Diagnosis not present

## 2020-12-07 DIAGNOSIS — R319 Hematuria, unspecified: Secondary | ICD-10-CM | POA: Diagnosis not present

## 2020-12-11 DIAGNOSIS — M25461 Effusion, right knee: Secondary | ICD-10-CM | POA: Diagnosis not present

## 2020-12-11 DIAGNOSIS — T84032D Mechanical loosening of internal right knee prosthetic joint, subsequent encounter: Secondary | ICD-10-CM | POA: Diagnosis not present

## 2020-12-11 DIAGNOSIS — G8918 Other acute postprocedural pain: Secondary | ICD-10-CM | POA: Diagnosis not present

## 2020-12-11 DIAGNOSIS — M25661 Stiffness of right knee, not elsewhere classified: Secondary | ICD-10-CM | POA: Diagnosis not present

## 2020-12-14 DIAGNOSIS — M25661 Stiffness of right knee, not elsewhere classified: Secondary | ICD-10-CM | POA: Diagnosis not present

## 2020-12-14 DIAGNOSIS — M25461 Effusion, right knee: Secondary | ICD-10-CM | POA: Diagnosis not present

## 2020-12-14 DIAGNOSIS — G8918 Other acute postprocedural pain: Secondary | ICD-10-CM | POA: Diagnosis not present

## 2020-12-14 DIAGNOSIS — T84032D Mechanical loosening of internal right knee prosthetic joint, subsequent encounter: Secondary | ICD-10-CM | POA: Diagnosis not present

## 2020-12-18 ENCOUNTER — Other Ambulatory Visit: Payer: Self-pay | Admitting: Internal Medicine

## 2020-12-18 DIAGNOSIS — F411 Generalized anxiety disorder: Secondary | ICD-10-CM

## 2020-12-18 MED ORDER — CLONAZEPAM 1 MG PO TABS: ORAL_TABLET | ORAL | 0 refills | Status: DC

## 2020-12-19 DIAGNOSIS — G8918 Other acute postprocedural pain: Secondary | ICD-10-CM | POA: Diagnosis not present

## 2020-12-19 DIAGNOSIS — T84032D Mechanical loosening of internal right knee prosthetic joint, subsequent encounter: Secondary | ICD-10-CM | POA: Diagnosis not present

## 2020-12-19 DIAGNOSIS — M25461 Effusion, right knee: Secondary | ICD-10-CM | POA: Diagnosis not present

## 2020-12-19 DIAGNOSIS — M25661 Stiffness of right knee, not elsewhere classified: Secondary | ICD-10-CM | POA: Diagnosis not present

## 2020-12-25 DIAGNOSIS — M25661 Stiffness of right knee, not elsewhere classified: Secondary | ICD-10-CM | POA: Diagnosis not present

## 2020-12-25 DIAGNOSIS — T84032D Mechanical loosening of internal right knee prosthetic joint, subsequent encounter: Secondary | ICD-10-CM | POA: Diagnosis not present

## 2020-12-25 DIAGNOSIS — G8918 Other acute postprocedural pain: Secondary | ICD-10-CM | POA: Diagnosis not present

## 2020-12-25 DIAGNOSIS — M25461 Effusion, right knee: Secondary | ICD-10-CM | POA: Diagnosis not present

## 2021-01-01 DIAGNOSIS — T84032D Mechanical loosening of internal right knee prosthetic joint, subsequent encounter: Secondary | ICD-10-CM | POA: Diagnosis not present

## 2021-01-01 DIAGNOSIS — M25461 Effusion, right knee: Secondary | ICD-10-CM | POA: Diagnosis not present

## 2021-01-01 DIAGNOSIS — M25661 Stiffness of right knee, not elsewhere classified: Secondary | ICD-10-CM | POA: Diagnosis not present

## 2021-01-01 DIAGNOSIS — G8918 Other acute postprocedural pain: Secondary | ICD-10-CM | POA: Diagnosis not present

## 2021-01-08 ENCOUNTER — Other Ambulatory Visit: Payer: Self-pay | Admitting: Internal Medicine

## 2021-01-09 DIAGNOSIS — T84032D Mechanical loosening of internal right knee prosthetic joint, subsequent encounter: Secondary | ICD-10-CM | POA: Diagnosis not present

## 2021-01-09 DIAGNOSIS — M25461 Effusion, right knee: Secondary | ICD-10-CM | POA: Diagnosis not present

## 2021-01-09 DIAGNOSIS — M25661 Stiffness of right knee, not elsewhere classified: Secondary | ICD-10-CM | POA: Diagnosis not present

## 2021-01-09 DIAGNOSIS — G8918 Other acute postprocedural pain: Secondary | ICD-10-CM | POA: Diagnosis not present

## 2021-01-16 DIAGNOSIS — M25661 Stiffness of right knee, not elsewhere classified: Secondary | ICD-10-CM | POA: Diagnosis not present

## 2021-01-16 DIAGNOSIS — M25461 Effusion, right knee: Secondary | ICD-10-CM | POA: Diagnosis not present

## 2021-01-16 DIAGNOSIS — T84032D Mechanical loosening of internal right knee prosthetic joint, subsequent encounter: Secondary | ICD-10-CM | POA: Diagnosis not present

## 2021-01-16 DIAGNOSIS — G8918 Other acute postprocedural pain: Secondary | ICD-10-CM | POA: Diagnosis not present

## 2021-01-17 ENCOUNTER — Other Ambulatory Visit: Payer: Self-pay | Admitting: Internal Medicine

## 2021-01-17 DIAGNOSIS — Z96651 Presence of right artificial knee joint: Secondary | ICD-10-CM | POA: Diagnosis not present

## 2021-01-17 DIAGNOSIS — Z471 Aftercare following joint replacement surgery: Secondary | ICD-10-CM | POA: Diagnosis not present

## 2021-02-06 DIAGNOSIS — G4733 Obstructive sleep apnea (adult) (pediatric): Secondary | ICD-10-CM | POA: Diagnosis not present

## 2021-02-22 DIAGNOSIS — M25461 Effusion, right knee: Secondary | ICD-10-CM | POA: Diagnosis not present

## 2021-02-22 DIAGNOSIS — Z96651 Presence of right artificial knee joint: Secondary | ICD-10-CM | POA: Diagnosis not present

## 2021-02-22 DIAGNOSIS — Z4789 Encounter for other orthopedic aftercare: Secondary | ICD-10-CM | POA: Diagnosis not present

## 2021-02-22 DIAGNOSIS — Z471 Aftercare following joint replacement surgery: Secondary | ICD-10-CM | POA: Diagnosis not present

## 2021-03-22 ENCOUNTER — Other Ambulatory Visit: Payer: Self-pay | Admitting: Internal Medicine

## 2021-03-22 DIAGNOSIS — F411 Generalized anxiety disorder: Secondary | ICD-10-CM

## 2021-03-22 MED ORDER — CLONAZEPAM 1 MG PO TABS: ORAL_TABLET | ORAL | 0 refills | Status: DC

## 2021-05-15 ENCOUNTER — Ambulatory Visit (INDEPENDENT_AMBULATORY_CARE_PROVIDER_SITE_OTHER): Payer: BC Managed Care – PPO | Admitting: Internal Medicine

## 2021-05-15 ENCOUNTER — Other Ambulatory Visit: Payer: Self-pay

## 2021-05-15 ENCOUNTER — Encounter: Payer: Self-pay | Admitting: Internal Medicine

## 2021-05-15 VITALS — BP 136/76 | HR 72 | Temp 97.9°F | Resp 16 | Ht 65.0 in | Wt 193.2 lb

## 2021-05-15 DIAGNOSIS — G4733 Obstructive sleep apnea (adult) (pediatric): Secondary | ICD-10-CM

## 2021-05-15 DIAGNOSIS — Z8249 Family history of ischemic heart disease and other diseases of the circulatory system: Secondary | ICD-10-CM

## 2021-05-15 DIAGNOSIS — Z1389 Encounter for screening for other disorder: Secondary | ICD-10-CM | POA: Diagnosis not present

## 2021-05-15 DIAGNOSIS — Z111 Encounter for screening for respiratory tuberculosis: Secondary | ICD-10-CM | POA: Diagnosis not present

## 2021-05-15 DIAGNOSIS — I700 Atherosclerosis of aorta: Secondary | ICD-10-CM | POA: Diagnosis not present

## 2021-05-15 DIAGNOSIS — R350 Frequency of micturition: Secondary | ICD-10-CM | POA: Diagnosis not present

## 2021-05-15 DIAGNOSIS — E559 Vitamin D deficiency, unspecified: Secondary | ICD-10-CM | POA: Diagnosis not present

## 2021-05-15 DIAGNOSIS — Z136 Encounter for screening for cardiovascular disorders: Secondary | ICD-10-CM

## 2021-05-15 DIAGNOSIS — Z0000 Encounter for general adult medical examination without abnormal findings: Secondary | ICD-10-CM | POA: Diagnosis not present

## 2021-05-15 DIAGNOSIS — Z13220 Encounter for screening for lipoid disorders: Secondary | ICD-10-CM | POA: Diagnosis not present

## 2021-05-15 DIAGNOSIS — I10 Essential (primary) hypertension: Secondary | ICD-10-CM

## 2021-05-15 DIAGNOSIS — Z125 Encounter for screening for malignant neoplasm of prostate: Secondary | ICD-10-CM

## 2021-05-15 DIAGNOSIS — Z131 Encounter for screening for diabetes mellitus: Secondary | ICD-10-CM | POA: Diagnosis not present

## 2021-05-15 DIAGNOSIS — Z0001 Encounter for general adult medical examination with abnormal findings: Secondary | ICD-10-CM

## 2021-05-15 DIAGNOSIS — Z1211 Encounter for screening for malignant neoplasm of colon: Secondary | ICD-10-CM

## 2021-05-15 DIAGNOSIS — E782 Mixed hyperlipidemia: Secondary | ICD-10-CM

## 2021-05-15 DIAGNOSIS — N401 Enlarged prostate with lower urinary tract symptoms: Secondary | ICD-10-CM | POA: Diagnosis not present

## 2021-05-15 DIAGNOSIS — Z1329 Encounter for screening for other suspected endocrine disorder: Secondary | ICD-10-CM | POA: Diagnosis not present

## 2021-05-15 DIAGNOSIS — R7309 Other abnormal glucose: Secondary | ICD-10-CM

## 2021-05-15 DIAGNOSIS — Z87891 Personal history of nicotine dependence: Secondary | ICD-10-CM

## 2021-05-15 DIAGNOSIS — R5383 Other fatigue: Secondary | ICD-10-CM

## 2021-05-15 DIAGNOSIS — Z130 Encounter for screening for diseases of the blood and blood-forming organs and certain disorders involving the immune mechanism: Secondary | ICD-10-CM | POA: Diagnosis not present

## 2021-05-15 DIAGNOSIS — Z79899 Other long term (current) drug therapy: Secondary | ICD-10-CM

## 2021-05-15 DIAGNOSIS — E349 Endocrine disorder, unspecified: Secondary | ICD-10-CM

## 2021-05-15 MED ORDER — VITAMIN D3 250 MCG (10000 UT) PO CAPS: ORAL_CAPSULE | ORAL | Status: AC
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
## 2021-05-15 NOTE — Patient Instructions (Signed)
Due to recent changes in healthcare laws, you may see the results of your imaging and laboratory studies on MyChart before your provider has had a chance to review them.  We understand that in some cases there may be results that are confusing or concerning to you. Not all laboratory results come back in the same time frame and the provider may be waiting for multiple results in order to interpret others.  Please give [place] 48 hours in order for your provider to thoroughly review all the results before contacting the office for clarification of your results.  ? ?++++++++++++++++++++++++++++++++++++++ ? Vit D  & ?Vit C 1,000 mg   ?are recommended to help protect  ?against the Covid-19 and other Corona viruses.  ? ? Also it's recommended  ?to take  ?Zinc 50 mg  ?to help  ?protect against the Covid-19   ?and best place to get ? is also on [employer]  ?and don't pay more than 6-8 cents /pill !  ?=============================== ?Coronavirus (COVID-19) Are you at risk? ? ?Are you at risk for the Coronavirus (COVID-19)? ? ?To be considered HIGH RISK for Coronavirus (COVID-19), you have to meet the following criteria: ? ?Traveled to [place], [place], [place], [place] or [place]; or in the [place] to [place], [place], [place]  ?or [place]; and have fever, cough, and shortness of breath within the last 2 weeks of travel OR ?Been in close contact with a person diagnosed with COVID-19 within the last 2 weeks and have  ?fever, cough,and shortness of breath ? ?IF YOU DO NOT MEET THESE CRITERIA, YOU ARE CONSIDERED LOW RISK FOR COVID-19. ? ?What to do if you are HIGH RISK for COVID-19? ? ?If you are having a medical emergency, call 911. ?Seek medical care right away. Before you go to a doctor?s office, urgent care or emergency department, ? call ahead and tell them about your recent travel, contact with someone diagnosed with COVID-19  ? and your symptoms.  ?You should receive instructions from your physician?s office  regarding next steps of care.  ?When you arrive at healthcare provider, tell the healthcare staff immediately you have returned from  ?visiting [place], [place], [place], [place] or [place]; or traveled in the [place] to [place], [place],  ?[place] or [place] in the last two weeks or you have been in close contact with a person diagnosed with  ?COVID-19 in the last 2 weeks.   ?Tell the health care staff about your symptoms: fever, cough and shortness of breath. ?After you have been seen by a medical provider, you will be either: ?Tested for (COVID-19) and discharged home on quarantine except to seek medical care if  ?symptoms worsen, and asked to  ?Stay home and avoid contact with others until you get your results (4-5 days)  ?Avoid travel on public transportation if possible (such as bus, train, or airplane) or ?Sent to the Emergency Department by EMS for evaluation, COVID-19 testing  and  ?possible admission depending on your condition and test results. ? ?What to do if you are LOW RISK for COVID-19? ? ?Reduce your risk of any infection by using the same precautions used for avoiding the common cold or flu:  ?Wash your hands often with soap and warm water for at least 20 seconds.  If soap and water are not readily available,  ?use an alcohol-based hand sanitizer with at least 60% alcohol.  ?If coughing or sneezing, cover your mouth and nose by coughing  or sneezing into the elbow areas of your shirt or coat, ? into a tissue or into your sleeve (not your hands). ?Avoid shaking hands with others and consider head nods or verbal greetings only. ?Avoid touching your eyes, nose, or mouth with unwashed hands.  ?Avoid close contact with people who are sick. ?Avoid places or events with large numbers of people in one location, like concerts or sporting events. ?Carefully consider travel plans you have or are making. ?If you are planning any travel outside or inside the [place], visit the CDC?s Travelers? Health  webpage for the latest health notices. ?If you have some symptoms but not all symptoms, continue to monitor at home and seek medical attention  ?if your symptoms worsen. ?If you are having a medical emergency, call 911. ?>>>>>>>>>>>>>>>>>>>>>>>>>>>> ?Preventive Care for Adults ? ?A healthy lifestyle and preventive care can promote health and wellness. Preventive health guidelines for men include the following key practices: ?A routine yearly physical is a good way to check with your health care provider about your health and preventative screening. It is a chance to share any concerns and updates on your health and to receive a thorough exam. ?Visit your dentist for a routine exam and preventative care every 6 months. Brush your teeth twice a day and floss once a day. Good oral hygiene prevents tooth decay and gum disease. ?The frequency of eye exams is based on your age, health, family medical history, use of contact lenses, and other factors. Follow your health care provider's recommendations for frequency of eye exams. ?Eat a healthy diet. Foods such as vegetables, fruits, whole grains, low-fat dairy products, and lean protein foods contain the nutrients you need without too many calories. Decrease your intake of foods high in solid fats, added sugars, and salt. Eat the right amount of calories for you. Get information about a proper diet from your health care provider, if necessary. ?Regular physical exercise is one of the most important things you can do for your health. Most adults should get at least 150 minutes of moderate-intensity exercise (any activity that increases your heart rate and causes you to sweat) each week. In addition, most adults need muscle-strengthening exercises on 2 or more days a week. ?Maintain a healthy weight. The body mass index (BMI) is a screening tool to identify possible weight problems. It provides an estimate of body fat based on height and weight. Your health care provider can  find your BMI and can help you achieve or maintain a healthy weight. For adults 20 years and older: ?A BMI below 18.5 is considered underweight. ?A BMI of 18.5 to 24.9 is normal. ?A BMI of 25 to 29.9 is considered overweight. ?A BMI of 30 and above is considered obese. ?Maintain normal blood lipids and cholesterol levels by exercising and minimizing your intake of saturated fat. Eat a balanced diet with plenty of fruit and vegetables. Blood tests for lipids and cholesterol should begin at age 76 and be repeated every 5 years. If your lipid or cholesterol levels are high, you are over 50, or you are at high risk for heart disease, you may need your cholesterol levels checked more frequently. Ongoing high lipid and cholesterol levels should be treated with medicines if diet and exercise are not working. ?If you smoke, find out from your health care provider how to quit. If you do not use tobacco, do not start. ?Lung cancer screening is recommended for adults aged 45-80 years who are at high risk for  developing lung cancer because of a history of smoking. A yearly low-dose CT scan of the lungs is recommended for people who have at least a 30-pack-year history of smoking and are a current smoker or have quit within the past 15 years. A pack year of smoking is smoking an average of 1 pack of cigarettes a day for 1 year (for example: 1 pack a day for 30 years or 2 packs a day for 15 years). Yearly screening should continue until the smoker has stopped smoking for at least 15 years. Yearly screening should be stopped for people who develop a health problem that would prevent them from having lung cancer treatment. ?If you choose to drink alcohol, do not have more than 2 drinks per day. One drink is considered to be 12 ounces (355 mL) of beer, 5 ounces (148 mL) of wine, or 1.5 ounces (44 mL) of liquor. ?Avoid use of street drugs. Do not share needles with anyone. Ask for help if you need support or instructions about  stopping the use of drugs. ?High blood pressure causes heart disease and increases the risk of stroke. Your blood pressure should be checked at least every 1-2 years. Ongoing high blood pressure should be treated

## 2021-05-16 LAB — COMPLETE METABOLIC PANEL WITH GFR
AG Ratio: 1.8 (calc) (ref 1.0–2.5)
ALT: 26 U/L (ref 9–46)
AST: 27 U/L (ref 10–35)
Albumin: 4.5 g/dL (ref 3.6–5.1)
Alkaline phosphatase (APISO): 51 U/L (ref 35–144)
BUN: 20 mg/dL (ref 7–25)
CO2: 26 mmol/L (ref 20–32)
Calcium: 9.7 mg/dL (ref 8.6–10.3)
Chloride: 104 mmol/L (ref 98–110)
Creat: 1.01 mg/dL (ref 0.70–1.35)
Globulin: 2.5 g/dL (calc) (ref 1.9–3.7)
Glucose, Bld: 90 mg/dL (ref 65–99)
Potassium: 4.3 mmol/L (ref 3.5–5.3)
Sodium: 139 mmol/L (ref 135–146)
Total Bilirubin: 0.7 mg/dL (ref 0.2–1.2)
Total Protein: 7 g/dL (ref 6.1–8.1)
eGFR: 84 mL/min/{1.73_m2} (ref >=60)

## 2021-05-16 LAB — URINALYSIS, ROUTINE W REFLEX MICROSCOPIC
Bilirubin Urine: NEGATIVE
Glucose, UA: NEGATIVE
Hgb urine dipstick: NEGATIVE
Leukocytes,Ua: NEGATIVE
Nitrite: NEGATIVE
Protein, ur: NEGATIVE
Specific Gravity, Urine: 1.018 (ref 1.001–1.035)
pH: 6 (ref 5.0–8.0)

## 2021-05-16 LAB — HEMOGLOBIN A1C
Hgb A1c MFr Bld: 5.3 % of total Hgb (ref <5.7)
Mean Plasma Glucose: 105 mg/dL
eAG (mmol/L): 5.8 mmol/L

## 2021-05-16 LAB — MICROALBUMIN / CREATININE URINE RATIO
Creatinine, Urine: 93 mg/dL (ref 20–320)
Microalb Creat Ratio: 37 mcg/mg creat — ABNORMAL HIGH (ref <30)
Microalb, Ur: 3.4 mg/dL

## 2021-05-16 LAB — CBC WITH DIFFERENTIAL/PLATELET
Absolute Monocytes: 659 cells/uL (ref 200–950)
Basophils Absolute: 92 cells/uL (ref 0–200)
Basophils Relative: 1.5 %
Eosinophils Absolute: 79 cells/uL (ref 15–500)
Eosinophils Relative: 1.3 %
HCT: 47.4 % (ref 38.5–50.0)
Hemoglobin: 15.7 g/dL (ref 13.2–17.1)
Lymphs Abs: 1251 cells/uL (ref 850–3900)
MCH: 31.5 pg (ref 27.0–33.0)
MCHC: 33.1 g/dL (ref 32.0–36.0)
MCV: 95.2 fL (ref 80.0–100.0)
MPV: 11.6 fL (ref 7.5–12.5)
Monocytes Relative: 10.8 %
Neutro Abs: 4020 cells/uL (ref 1500–7800)
Neutrophils Relative %: 65.9 %
Platelets: 190 10*3/uL (ref 140–400)
RBC: 4.98 10*6/uL (ref 4.20–5.80)
RDW: 12.5 % (ref 11.0–15.0)
Total Lymphocyte: 20.5 %
WBC: 6.1 10*3/uL (ref 3.8–10.8)

## 2021-05-16 LAB — LIPID PANEL
Cholesterol: 123 mg/dL (ref <200)
HDL: 59 mg/dL (ref >=40)
LDL Cholesterol (Calc): 44 mg/dL (calc)
Non-HDL Cholesterol (Calc): 64 mg/dL (calc) (ref <130)
Total CHOL/HDL Ratio: 2.1 (calc) (ref <5.0)
Triglycerides: 121 mg/dL (ref <150)

## 2021-05-16 LAB — MAGNESIUM: Magnesium: 2.1 mg/dL (ref 1.5–2.5)

## 2021-05-16 LAB — VITAMIN D 25 HYDROXY (VIT D DEFICIENCY, FRACTURES): Vit D, 25-Hydroxy: 81 ng/mL (ref 30–100)

## 2021-05-16 LAB — PSA: PSA: 1.08 ng/mL (ref <=4.00)

## 2021-05-16 LAB — INSULIN, RANDOM: Insulin: 12.7 u[IU]/mL

## 2021-05-16 LAB — IRON, TOTAL/TOTAL IRON BINDING CAP
%SAT: 26 % (calc) (ref 20–48)
Iron: 92 ug/dL (ref 50–180)
TIBC: 356 mcg/dL (calc) (ref 250–425)

## 2021-05-16 LAB — TESTOSTERONE: Testosterone: 1339 ng/dL — ABNORMAL HIGH (ref 250–827)

## 2021-05-16 LAB — VITAMIN B12: Vitamin B-12: 574 pg/mL (ref 200–1100)

## 2021-05-16 LAB — TSH: TSH: 0.88 mIU/L (ref 0.40–4.50)

## 2021-05-16 NOTE — Progress Notes (Signed)
<><><><><><><><><><><><><><><><><><><><><><><><><><><><><><><><><> ?<><><><><><><><><><><><><><><><><><><><><><><><><><><><><><><><><> ?-   Test results slightly outside the reference range are not unusual. ?If there is anything important, I will review this with you,  ?otherwise it is considered normal test values.  ?If you have further questions,  ?please do not hesitate to contact me at the office or via My Chart.  ?<><><><><><><><><><><><><><><><><><><><><><><><><><><><><><><><><> ?<><><><><><><><><><><><><><><><><><><><><><><><><><><><><><><><><> ? ?-  Testosterone is elevated from recent shot  ?<><><><><><><><><><><><><><><><><><><><><><><><><><><><><><><><><> ?<><><><><><><><><><><><><><><><><><><><><><><><><><><><><><><><><> ? ?-  Iron & Vitamin B12 levels are both Normal  ?<><><><><><><><><><><><><><><><><><><><><><><><><><><><><><><><><> ?<><><><><><><><><><><><><><><><><><><><><><><><><><><><><><><><><> ? ?-  PSA is low - Great   ?<><><><><><><><><><><><><><><><><><><><><><><><><><><><><><><><><> ?<><><><><><><><><><><><><><><><><><><><><><><><><><><><><><><><><> ? ?-  Total Chol = 123     &   LDL Chol = 44    - Both   Excellent   !  ?<><><><><><><><><><><><><><><><><><><><><><><><><><><><><><><><><> ?<><><><><><><><><><><><><><><><><><><><><><><><><><><><><><><><><> ? ?-  A1c - Normal - No Diabetes   - Great  ?<><><><><><><><><><><><><><><><><><><><><><><><><><><><><><><><><> ?<><><><><><><><><><><><><><><><><><><><><><><><><><><><><><><><><> ? ?-  Vitamin D = 81   - Excellent  - Please keep dose same  ?<><><><><><><><><><><><><><><><><><><><><><><><><><><><><><><><><> ?<><><><><><><><><><><><><><><><><><><><><><><><><><><><><><><><><> ? ?-  All Else - CBC - Kidneys - Electrolytes - Liver - Magnesium & Thyroid   ? ?- all  Normal / OK ?<><><><><><><><><><><><><><><><><><><><><><><><><><><><><><><><><> ?<><><><><><><><><><><><><><><><><><><><><><><><><><><><><><><><><> ? ?-  Keep up the [place] Work   ! ? ?<><><><><><><><><><><><><><><><><><><><><><><><><><><><><><><><><> ?<><><><><><><><><><><><><><><><><><><><><><><><><><><><><><><><><> ? ? ? ? ? ? ? ? ? ? ? ? ? ? ? ? ? ? ? ? ? ? ? ?

## 2021-05-29 ENCOUNTER — Other Ambulatory Visit: Payer: Self-pay

## 2021-05-29 DIAGNOSIS — Z1211 Encounter for screening for malignant neoplasm of colon: Secondary | ICD-10-CM

## 2021-05-29 LAB — POC HEMOCCULT BLD/STL (HOME/3-CARD/SCREEN)
Card #2 Fecal Occult Blod, POC: NEGATIVE
Card #3 Fecal Occult Blood, POC: NEGATIVE
Fecal Occult Blood, POC: NEGATIVE

## 2021-05-30 DIAGNOSIS — Z1212 Encounter for screening for malignant neoplasm of rectum: Secondary | ICD-10-CM | POA: Diagnosis not present

## 2021-05-30 DIAGNOSIS — Z1211 Encounter for screening for malignant neoplasm of colon: Secondary | ICD-10-CM | POA: Diagnosis not present

## 2021-06-24 ENCOUNTER — Other Ambulatory Visit: Payer: Self-pay | Admitting: Internal Medicine

## 2021-06-24 DIAGNOSIS — R0989 Other specified symptoms and signs involving the circulatory and respiratory systems: Secondary | ICD-10-CM

## 2021-06-24 MED ORDER — AMLODIPINE BESYLATE 10 MG PO TABS: ORAL_TABLET | ORAL | 3 refills | Status: DC

## 2021-06-24 MED ORDER — EZETIMIBE 10 MG PO TABS: 5.0000 mg | ORAL_TABLET | Freq: Every day | ORAL | 3 refills | Status: DC

## 2021-06-25 ENCOUNTER — Other Ambulatory Visit: Payer: Self-pay

## 2021-06-25 MED ORDER — EZETIMIBE 10 MG PO TABS: ORAL_TABLET | ORAL | 3 refills | Status: DC

## 2021-07-01 ENCOUNTER — Other Ambulatory Visit: Payer: Self-pay | Admitting: Internal Medicine

## 2021-07-01 DIAGNOSIS — F411 Generalized anxiety disorder: Secondary | ICD-10-CM

## 2021-07-01 MED ORDER — CLONAZEPAM 1 MG PO TABS: ORAL_TABLET | ORAL | 0 refills | Status: DC

## 2021-07-03 DIAGNOSIS — C44519 Basal cell carcinoma of skin of other part of trunk: Secondary | ICD-10-CM | POA: Diagnosis not present

## 2021-07-03 DIAGNOSIS — L570 Actinic keratosis: Secondary | ICD-10-CM | POA: Diagnosis not present

## 2021-07-03 DIAGNOSIS — D485 Neoplasm of uncertain behavior of skin: Secondary | ICD-10-CM | POA: Diagnosis not present

## 2021-07-03 DIAGNOSIS — C44311 Basal cell carcinoma of skin of nose: Secondary | ICD-10-CM | POA: Diagnosis not present

## 2021-07-09 ENCOUNTER — Other Ambulatory Visit: Payer: Self-pay | Admitting: Internal Medicine

## 2021-07-09 DIAGNOSIS — N138 Other obstructive and reflux uropathy: Secondary | ICD-10-CM

## 2021-07-09 MED ORDER — TAMSULOSIN HCL 0.4 MG PO CAPS: ORAL_CAPSULE | ORAL | 3 refills | Status: DC

## 2021-07-11 ENCOUNTER — Other Ambulatory Visit: Payer: Self-pay | Admitting: Internal Medicine

## 2021-07-11 DIAGNOSIS — N138 Other obstructive and reflux uropathy: Secondary | ICD-10-CM

## 2021-07-11 MED ORDER — TADALAFIL 5 MG PO TABS: ORAL_TABLET | ORAL | 3 refills | Status: DC

## 2021-07-23 DIAGNOSIS — G4733 Obstructive sleep apnea (adult) (pediatric): Secondary | ICD-10-CM | POA: Diagnosis not present

## 2021-07-24 ENCOUNTER — Other Ambulatory Visit: Payer: Self-pay

## 2021-07-24 MED ORDER — OLMESARTAN MEDOXOMIL 20 MG PO TABS: ORAL_TABLET | ORAL | 1 refills | Status: DC

## 2021-07-26 DIAGNOSIS — Z96653 Presence of artificial knee joint, bilateral: Secondary | ICD-10-CM | POA: Diagnosis not present

## 2021-07-26 DIAGNOSIS — M25461 Effusion, right knee: Secondary | ICD-10-CM | POA: Diagnosis not present

## 2021-07-26 DIAGNOSIS — Z96651 Presence of right artificial knee joint: Secondary | ICD-10-CM | POA: Diagnosis not present

## 2021-07-26 DIAGNOSIS — M85861 Other specified disorders of bone density and structure, right lower leg: Secondary | ICD-10-CM | POA: Diagnosis not present

## 2021-07-26 DIAGNOSIS — Z471 Aftercare following joint replacement surgery: Secondary | ICD-10-CM | POA: Diagnosis not present

## 2021-08-04 ENCOUNTER — Other Ambulatory Visit: Payer: Self-pay | Admitting: Internal Medicine

## 2021-08-04 DIAGNOSIS — E782 Mixed hyperlipidemia: Secondary | ICD-10-CM

## 2021-08-05 ENCOUNTER — Other Ambulatory Visit: Payer: Self-pay | Admitting: Internal Medicine

## 2021-08-05 DIAGNOSIS — E349 Endocrine disorder, unspecified: Secondary | ICD-10-CM

## 2021-08-05 MED ORDER — TESTOSTERONE CYPIONATE 200 MG/ML IM SOLN: INTRAMUSCULAR | 2 refills | Status: DC

## 2021-08-20 DIAGNOSIS — L988 Other specified disorders of the skin and subcutaneous tissue: Secondary | ICD-10-CM | POA: Diagnosis not present

## 2021-08-20 DIAGNOSIS — C44519 Basal cell carcinoma of skin of other part of trunk: Secondary | ICD-10-CM | POA: Diagnosis not present

## 2021-08-22 ENCOUNTER — Other Ambulatory Visit: Payer: Self-pay

## 2021-08-22 DIAGNOSIS — J301 Allergic rhinitis due to pollen: Secondary | ICD-10-CM

## 2021-08-22 MED ORDER — LORATADINE-D 24HR 10-240 MG PO TB24: ORAL_TABLET | ORAL | 3 refills | Status: DC

## 2021-08-28 ENCOUNTER — Ambulatory Visit: Payer: BC Managed Care – PPO | Admitting: Internal Medicine

## 2021-09-18 ENCOUNTER — Other Ambulatory Visit: Payer: Self-pay

## 2021-09-18 DIAGNOSIS — N138 Other obstructive and reflux uropathy: Secondary | ICD-10-CM

## 2021-09-18 MED ORDER — FINASTERIDE 5 MG PO TABS: ORAL_TABLET | ORAL | 3 refills | Status: DC

## 2021-10-08 ENCOUNTER — Other Ambulatory Visit: Payer: Self-pay | Admitting: Internal Medicine

## 2021-10-08 DIAGNOSIS — F411 Generalized anxiety disorder: Secondary | ICD-10-CM

## 2021-10-08 DIAGNOSIS — E559 Vitamin D deficiency, unspecified: Secondary | ICD-10-CM

## 2021-10-08 MED ORDER — CLONAZEPAM 1 MG PO TABS: ORAL_TABLET | ORAL | 0 refills | Status: DC

## 2021-11-26 ENCOUNTER — Ambulatory Visit (INDEPENDENT_AMBULATORY_CARE_PROVIDER_SITE_OTHER): Payer: BC Managed Care – PPO | Admitting: Internal Medicine

## 2021-11-26 ENCOUNTER — Encounter: Payer: Self-pay | Admitting: Internal Medicine

## 2021-11-26 VITALS — BP 142/60 | HR 66 | Temp 97.9°F | Resp 16 | Ht 65.0 in | Wt 191.4 lb

## 2021-11-26 DIAGNOSIS — E782 Mixed hyperlipidemia: Secondary | ICD-10-CM | POA: Diagnosis not present

## 2021-11-26 DIAGNOSIS — Z9989 Dependence on other enabling machines and devices: Secondary | ICD-10-CM

## 2021-11-26 DIAGNOSIS — I10 Essential (primary) hypertension: Secondary | ICD-10-CM | POA: Diagnosis not present

## 2021-11-26 DIAGNOSIS — R7309 Other abnormal glucose: Secondary | ICD-10-CM

## 2021-11-26 DIAGNOSIS — E559 Vitamin D deficiency, unspecified: Secondary | ICD-10-CM | POA: Diagnosis not present

## 2021-11-26 DIAGNOSIS — Z79899 Other long term (current) drug therapy: Secondary | ICD-10-CM

## 2021-11-26 DIAGNOSIS — Z23 Encounter for immunization: Secondary | ICD-10-CM

## 2021-11-26 DIAGNOSIS — E349 Endocrine disorder, unspecified: Secondary | ICD-10-CM | POA: Diagnosis not present

## 2021-11-26 DIAGNOSIS — G4733 Obstructive sleep apnea (adult) (pediatric): Secondary | ICD-10-CM
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
## 2021-11-26 NOTE — Patient Instructions (Signed)
Due to recent changes in healthcare laws, you may see the results of your imaging and laboratory studies on MyChart before your provider has had a chance to review them.  We understand that in some cases there may be results that are confusing or concerning to you. Not all laboratory results come back in the same time frame and the provider may be waiting for multiple results in order to interpret others.  Please give [place] 48 hours in order for your provider to thoroughly review all the results before contacting the office for clarification of your results.  ++++++++++++++++++++++++++  Vit D  & Vit C 1,000 mg   are recommended to help protect  against the Covid-19 and other Corona viruses.    Also it's recommended  to take  Zinc 50 mg  to help  protect against the Covid-19   and best place to get  is also on [email or web address]  and don't pay more than 6-8 cents /pill !   +++++++++++++++++++++++++++++++++++++++ Recommend Adult Low Dose Aspirin or  coated  Aspirin 81 mg daily  To reduce risk of Colon Cancer 40 %,  Skin Cancer 26 % ,  Melanoma 46%  and  Pancreatic cancer 60% +++++++++++++++++++++++++++++++++++++++++ Vitamin D goal  is between 70-100.  Please make sure that you are taking your Vitamin D as directed.  It is very important as a natural anti-inflammatory  helping hair, skin, and nails, as well as reducing stroke and heart attack risk.  It helps your bones and helps with mood. It also decreases numerous cancer risks so please take it as directed.  Low Vit D is associated with a 200-300% higher risk for CANCER  and 200-300% higher risk for HEART   ATTACK  &  STROKE.   .....................................Marland Kitchen It is also associated with higher death rate at younger ages,  autoimmune diseases like Rheumatoid arthritis, Lupus, Multiple Sclerosis.    Also many other serious conditions, like depression, Alzheimer's Dementia, infertility, muscle aches, fatigue, fibromyalgia - just to name  a few. +++++++++++++++++++++++++++++++++++++++++ Recommend the book "The END of DIETING" by Dr Excell Seltzer  & the book "The END of DIABETES " by Dr Excell Seltzer At [email or web address] - get book & Audio CD's    Being diabetic has a  300% increased risk for heart attack, stroke, cancer, and alzheimer- type vascular dementia. It is very important that you work harder with diet by avoiding all foods that are white. Avoid white rice (brown & wild rice is OK), white potatoes (sweetpotatoes in moderation is OK), White bread or wheat bread or anything made out of white flour like bagels, donuts, rolls, buns, biscuits, cakes, pastries, cookies, pizza crust, and pasta (made from white flour & egg whites) - vegetarian pasta or spinach or wheat pasta is OK. Multigrain breads like Arnold's or [employer], or multigrain sandwich thins or flatbreads.  Diet, exercise and weight loss can reverse and cure diabetes in the early stages.  Diet, exercise and weight loss is very important in the control and prevention of complications of diabetes which affects every system in your body, ie. Brain - dementia/stroke, eyes - glaucoma/blindness, heart - heart attack/heart failure, kidneys - dialysis, stomach - gastric paralysis, intestines - malabsorption, nerves - severe painful neuritis, circulation - gangrene & loss of a leg(s), and finally cancer and Alzheimers.    I recommend avoid fried & greasy foods,  sweets/candy, white rice (brown or wild rice or Quinoa is OK), white potatoes (sweet potatoes are OK) - anything  made from white flour - bagels, doughnuts, rolls, buns, biscuits,white and wheat breads, pizza crust and traditional pasta made of white flour & egg white(vegetarian pasta or spinach or wheat pasta is OK).  Multi-grain bread is OK - like multi-grain flat bread or sandwich thins. Avoid alcohol in excess. Exercise is also important.    Eat all the vegetables you want - avoid meat, especially red meat and dairy - especially  cheese.  Cheese is the most concentrated form of trans-fats which is the worst thing to clog up our arteries. Veggie cheese is OK which can be found in the fresh produce section at [employer] or [employer] or [employer]  +++++++++++++++++++++++++++++++++++++++ DASH Eating Plan  DASH stands for "Dietary Approaches to Stop Hypertension."   The DASH eating plan is a healthy eating plan that has been shown to reduce high blood pressure (hypertension). Additional health benefits may include reducing the risk of type 2 diabetes mellitus, heart disease, and stroke. The DASH eating plan may also help with weight loss. WHAT DO I NEED TO KNOW ABOUT THE DASH EATING PLAN? For the DASH eating plan, you will follow these general guidelines: Choose foods with a percent daily value for sodium of less than 5% (as listed on the food label). Use salt-free seasonings or herbs instead of table salt or sea salt. Check with your health care provider or pharmacist before using salt substitutes. Eat lower-sodium products, often labeled as "lower sodium" or "no salt added." Eat fresh foods. Eat more vegetables, fruits, and low-fat dairy products. Choose whole grains. Look for the word "whole" as the first word in the ingredient list. Choose fish  Limit sweets, desserts, sugars, and sugary drinks. Choose heart-healthy fats. Eat veggie cheese  Eat more home-cooked food and less restaurant, buffet, and fast food. Limit fried foods. Cook foods using methods other than frying. Limit canned vegetables. If you do use them, rinse them well to decrease the sodium. When eating at a restaurant, ask that your food be prepared with less salt, or no salt if possible.                      WHAT FOODS CAN I EAT? Read Dr Fara Olden Fuhrman's books on The End of Dieting & The End of Diabetes  Grains Whole grain or whole wheat bread. Brown rice. Whole grain or whole wheat pasta. Quinoa, bulgur, and whole grain cereals. Low-sodium  cereals. Corn or whole wheat flour tortillas. Whole grain cornbread. Whole grain crackers. Low-sodium crackers.  Vegetables Fresh or frozen vegetables (raw, steamed, roasted, or grilled). Low-sodium or reduced-sodium tomato and vegetable juices. Low-sodium or reduced-sodium tomato sauce and paste. Low-sodium or reduced-sodium canned vegetables.   Fruits All fresh, canned (in natural juice), or frozen fruits.  Protein Products  All fish and seafood.  Dried beans, peas, or lentils. Unsalted nuts and seeds. Unsalted canned beans.  Dairy Low-fat dairy products, such as skim or 1% milk, 2% or reduced-fat cheeses, low-fat ricotta or cottage cheese, or plain low-fat yogurt. Low-sodium or reduced-sodium cheeses.  Fats and Oils Tub margarines without trans fats. Light or reduced-fat mayonnaise and salad dressings (reduced sodium). Avocado. Safflower, olive, or canola oils. Natural peanut or almond butter.  Other Unsalted popcorn and pretzels. The items listed above may not be a complete list of recommended foods or beverages. Contact your dietitian for more options.  +++++++++++++++  WHAT FOODS ARE NOT RECOMMENDED? Grains/ White flour or wheat flour White bread. White pasta. White rice. Refined  cornbread. Bagels and croissants. Crackers that contain trans fat.  Vegetables  Creamed or fried vegetables. Vegetables in a . Regular canned vegetables. Regular canned tomato sauce and paste. Regular tomato and vegetable juices.  Fruits Dried fruits. Canned fruit in light or heavy syrup. Fruit juice.  Meat and Other Protein Products Meat in general - RED meat & White meat.  Fatty cuts of meat. Ribs, chicken wings, all processed meats as bacon, sausage, bologna, salami, fatback, hot dogs, bratwurst and packaged luncheon meats.  Dairy Whole or 2% milk, cream, half-and-half, and cream cheese. Whole-fat or sweetened yogurt. Full-fat cheeses or blue cheese. Non-dairy creamers and whipped toppings.  Processed cheese, cheese spreads, or cheese curds.  Condiments Onion and garlic salt, seasoned salt, table salt, and sea salt. Canned and packaged gravies. Worcestershire sauce. Tartar sauce. Barbecue sauce. Teriyaki sauce. Soy sauce, including reduced sodium. Steak sauce. Fish sauce. Oyster sauce. Cocktail sauce. Horseradish. Ketchup and mustard. Meat flavorings and tenderizers. Bouillon cubes. Hot sauce. Tabasco sauce. Marinades. Taco seasonings. Relishes.  Fats and Oils Butter, stick margarine, lard, shortening and bacon fat. Coconut, palm kernel, or palm oils. Regular salad dressings.  Pickles and olives. Salted popcorn and pretzels.  The items listed above may not be a complete list of foods and beverages to avoid.

## 2021-11-27 DIAGNOSIS — Z23 Encounter for immunization: Secondary | ICD-10-CM | POA: Diagnosis not present

## 2021-11-27 LAB — COMPLETE METABOLIC PANEL WITH GFR
AG Ratio: 2 (calc) (ref 1.0–2.5)
ALT: 27 U/L (ref 9–46)
AST: 28 U/L (ref 10–35)
Albumin: 4.5 g/dL (ref 3.6–5.1)
Alkaline phosphatase (APISO): 48 U/L (ref 35–144)
BUN: 23 mg/dL (ref 7–25)
CO2: 25 mmol/L (ref 20–32)
Calcium: 9.1 mg/dL (ref 8.6–10.3)
Chloride: 104 mmol/L (ref 98–110)
Creat: 1.03 mg/dL (ref 0.70–1.35)
Globulin: 2.3 g/dL (calc) (ref 1.9–3.7)
Glucose, Bld: 89 mg/dL (ref 65–99)
Potassium: 4 mmol/L (ref 3.5–5.3)
Sodium: 138 mmol/L (ref 135–146)
Total Bilirubin: 0.8 mg/dL (ref 0.2–1.2)
Total Protein: 6.8 g/dL (ref 6.1–8.1)
eGFR: 82 mL/min/{1.73_m2} (ref >=60)

## 2021-11-27 LAB — CBC WITH DIFFERENTIAL/PLATELET
Absolute Monocytes: 634 cells/uL (ref 200–950)
Basophils Absolute: 78 cells/uL (ref 0–200)
Basophils Relative: 1.5 %
Eosinophils Absolute: 140 cells/uL (ref 15–500)
Eosinophils Relative: 2.7 %
HCT: 46.6 % (ref 38.5–50.0)
Hemoglobin: 16 g/dL (ref 13.2–17.1)
Lymphs Abs: 1373 cells/uL (ref 850–3900)
MCH: 33 pg (ref 27.0–33.0)
MCHC: 34.3 g/dL (ref 32.0–36.0)
MCV: 96.1 fL (ref 80.0–100.0)
MPV: 11.4 fL (ref 7.5–12.5)
Monocytes Relative: 12.2 %
Neutro Abs: 2974 cells/uL (ref 1500–7800)
Neutrophils Relative %: 57.2 %
Platelets: 188 10*3/uL (ref 140–400)
RBC: 4.85 10*6/uL (ref 4.20–5.80)
RDW: 11.9 % (ref 11.0–15.0)
Total Lymphocyte: 26.4 %
WBC: 5.2 10*3/uL (ref 3.8–10.8)

## 2021-11-27 LAB — TESTOSTERONE: Testosterone: 1126 ng/dL — ABNORMAL HIGH (ref 250–827)

## 2021-11-27 LAB — LIPID PANEL
Cholesterol: 118 mg/dL (ref <200)
HDL: 56 mg/dL (ref >=40)
LDL Cholesterol (Calc): 40 mg/dL (calc)
Non-HDL Cholesterol (Calc): 62 mg/dL (calc) (ref <130)
Total CHOL/HDL Ratio: 2.1 (calc) (ref <5.0)
Triglycerides: 138 mg/dL (ref <150)

## 2021-11-27 LAB — HEMOGLOBIN A1C
Hgb A1c MFr Bld: 5.2 % of total Hgb (ref <5.7)
Mean Plasma Glucose: 103 mg/dL
eAG (mmol/L): 5.7 mmol/L

## 2021-11-27 LAB — TSH: TSH: 0.87 mIU/L (ref 0.40–4.50)

## 2021-11-27 LAB — INSULIN, RANDOM: Insulin: 13.3 u[IU]/mL

## 2021-11-27 LAB — MAGNESIUM: Magnesium: 2.1 mg/dL (ref 1.5–2.5)

## 2021-11-27 LAB — VITAMIN D 25 HYDROXY (VIT D DEFICIENCY, FRACTURES): Vit D, 25-Hydroxy: 101 ng/mL — ABNORMAL HIGH (ref 30–100)

## 2021-11-27 NOTE — Progress Notes (Signed)
<><><><><><><><><><><><><><><><><><><><><><><><><><><><><><><><><> <><><><><><><><><><><><><><><><><><><><><><><><><><><><><><><><><> -   Test results slightly outside the reference range are not unusual. If there is anything important, I will review this with you,  otherwise it is considered normal test values.  If you have further questions,  please do not hesitate to contact me at the office or via My Chart.  <><><><><><><><><><><><><><><><><><><><><><><><><><><><><><><><><> <><><><><><><><><><><><><><><><><><><><><><><><><><><><><><><><><>  -  Testosterone is high  from recent shot  <><><><><><><><><><><><><><><><><><><><><><><><><><><><><><><><><>  - Vitamin D = 101  - Excellent - Please keep dose same  <><><><><><><><><><><><><><><><><><><><><><><><><><><><><><><><><>  - All Else - CBC - Kidneys - Electrolytes - Liver - Magnesium & Thyroid    - all  Normal / OK <><><><><><><><><><><><><><><><><><><><><><><><><><><><><><><><><> <><><><><><><><><><><><><><><><><><><><><><><><><><><><><><><><><>

## 2021-12-02 ENCOUNTER — Ambulatory Visit: Payer: BC Managed Care – PPO | Admitting: Internal Medicine

## 2022-01-01 ENCOUNTER — Other Ambulatory Visit: Payer: Self-pay | Admitting: Internal Medicine

## 2022-01-01 DIAGNOSIS — F411 Generalized anxiety disorder: Secondary | ICD-10-CM

## 2022-01-30 ENCOUNTER — Other Ambulatory Visit: Payer: Self-pay

## 2022-01-30 MED ORDER — OLMESARTAN MEDOXOMIL 20 MG PO TABS: ORAL_TABLET | ORAL | 1 refills | Status: DC

## 2022-03-03 HISTORY — PX: SHOULDER ARTHROSCOPY WITH ROTATOR CUFF REPAIR: SHX5685

## 2022-04-06 ENCOUNTER — Other Ambulatory Visit: Payer: Self-pay | Admitting: Internal Medicine

## 2022-04-06 DIAGNOSIS — F411 Generalized anxiety disorder: Secondary | ICD-10-CM

## 2022-04-06 MED ORDER — CLONAZEPAM 1 MG PO TABS: ORAL_TABLET | ORAL | 0 refills | Status: DC

## 2022-04-30 ENCOUNTER — Other Ambulatory Visit: Payer: Self-pay | Admitting: Internal Medicine

## 2022-04-30 DIAGNOSIS — E349 Endocrine disorder, unspecified: Secondary | ICD-10-CM

## 2022-04-30 MED ORDER — TESTOSTERONE CYPIONATE 200 MG/ML IM SOLN: INTRAMUSCULAR | 2 refills | Status: DC

## 2022-05-21 ENCOUNTER — Encounter: Payer: BC Managed Care – PPO | Admitting: Internal Medicine

## 2022-07-02 ENCOUNTER — Other Ambulatory Visit: Payer: Self-pay | Admitting: Internal Medicine

## 2022-07-02 DIAGNOSIS — N401 Enlarged prostate with lower urinary tract symptoms: Secondary | ICD-10-CM

## 2022-07-02 MED ORDER — TAMSULOSIN HCL 0.4 MG PO CAPS: ORAL_CAPSULE | ORAL | 3 refills | Status: DC

## 2022-07-03 ENCOUNTER — Encounter: Payer: Self-pay | Admitting: Internal Medicine
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
## 2022-07-03 NOTE — Patient Instructions (Signed)
Due to recent changes in healthcare laws, you may see the results of your imaging and laboratory studies on MyChart before your provider has had a chance to review them.  We understand that in some cases there may be results that are confusing or concerning to you. Not all laboratory results come back in the same time frame and the provider may be waiting for multiple results in order to interpret others.  Please give us 48 hours in order for your provider to thoroughly review all the results before contacting the office for clarification of your results.   +++++++++++++++++++++++++++++++  Vit D  & Vit C 1,000 mg   are recommended to help protect  against the Covid-19 and other Corona viruses.    Also it's recommended  to take  Zinc 50 mg  to help  protect against the Covid-19   and best place to get  is also on [employer]  and don't pay more than 6-8 cents /pill !  ================================ Coronavirus (COVID-19) Are you at risk?  Are you at risk for the Coronavirus (COVID-19)?  To be considered HIGH RISK for Coronavirus (COVID-19), you have to meet the following criteria:  Traveled to [place], [place], [place], [place] or [place]; or in the [place] to [place], [place], [place]  or [place]; and have fever, cough, and shortness of breath within the last 2 weeks of travel OR Been in close contact with a person diagnosed with COVID-19 within the last 2 weeks and have  fever, cough,and shortness of breath  IF YOU DO NOT MEET THESE CRITERIA, YOU ARE CONSIDERED LOW RISK FOR COVID-19.  What to do if you are HIGH RISK for COVID-19?  If you are having a medical emergency, call 911. Seek medical care right away. Before you go to a doctor's office, urgent care or emergency department,  call ahead and tell them about your recent travel, contact with someone diagnosed with COVID-19   and your symptoms.  You should receive instructions from your physician's office regarding  next steps of care.  When you arrive at healthcare provider, tell the healthcare staff immediately you have returned from  visiting [place], [place], [place], [place] or [place]; or traveled in the [place] to [place], [place],  [place] or [place] in the last two weeks or you have been in close contact with a person diagnosed with  COVID-19 in the last 2 weeks.   Tell the health care staff about your symptoms: fever, cough and shortness of breath. After you have been seen by a medical provider, you will be either: Tested for (COVID-19) and discharged home on quarantine except to seek medical care if  symptoms worsen, and asked to  Stay home and avoid contact with others until you get your results (4-5 days)  Avoid travel on public transportation if possible (such as bus, train, or airplane) or Sent to the Emergency Department by EMS for evaluation, COVID-19 testing  and  possible admission depending on your condition and test results.  What to do if you are LOW RISK for COVID-19?  Reduce your risk of any infection by using the same precautions used for avoiding the common cold or flu:  Wash your hands often with soap and warm water for at least 20 seconds.  If soap and water are not readily available,  use an alcohol-based hand sanitizer with at least 60% alcohol.  If coughing or sneezing, cover your mouth and nose by coughing   or sneezing into the elbow areas of your shirt or coat,  into a tissue or into your sleeve (not your hands). Avoid shaking hands with others and consider head nods or verbal greetings only. Avoid touching your eyes, nose, or mouth with unwashed hands.  Avoid close contact with people who are sick. Avoid places or events with large numbers of people in one location, like concerts or sporting events. Carefully consider travel plans you have or are making. If you are planning any travel outside or inside the [place], visit the CDC's Travelers' Health webpage for  the latest health notices. If you have some symptoms but not all symptoms, continue to monitor at home and seek medical attention  if your symptoms worsen. If you are having a medical emergency, call 911. >>>>>>>>>>>>>>>>>>>>>>>>>>>>>>>>>>>>>>>>>>>>>>>>>>> We Do NOT Approve of [employer] > > > > > > > > > > > > > > > > > > > > > > > > > > > > > > > > > > >  > >    Preventive Care for Adults  A healthy lifestyle and preventive care can promote health and wellness. Preventive health guidelines for men include the following key practices: A routine yearly physical is a good way to check with your health care provider about your health and preventative screening. It is a chance to share any concerns and updates on your health and to receive a thorough exam. Visit your dentist for a routine exam and preventative care every 6 months. Brush your teeth twice a day and floss once a day. Good oral hygiene prevents tooth decay and gum disease. The frequency of eye exams is based on your age, health, family medical history, use of contact lenses, and other factors. Follow your health care provider's recommendations for frequency of eye exams. Eat a healthy diet. Foods such as vegetables, fruits, whole grains, low-fat dairy products, and lean protein foods contain the nutrients you need without too many calories. Decrease your intake of foods high in solid fats, added sugars, and salt. Eat the right amount of calories for you. Get information about a proper diet from your health care provider, if necessary. Regular physical exercise is one of the most important things you can do for your health. Most adults should get at least 150 minutes of moderate-intensity exercise (any activity that increases your heart rate and causes you to sweat) each week. In addition, most adults need muscle-strengthening exercises on 2 or more days a week. Maintain a healthy weight. The body mass index (BMI) is a screening  tool to identify possible weight problems. It provides an estimate of body fat based on height and weight. Your health care provider can find your BMI and can help you achieve or maintain a healthy weight. For adults 20 years and older: A BMI below 18.5 is considered underweight. A BMI of 18.5 to 24.9 is normal. A BMI of 25 to 29.9 is considered overweight. A BMI of 30 and above is considered obese. Maintain normal blood lipids and cholesterol levels by exercising and minimizing your intake of saturated fat. Eat a balanced diet with plenty of fruit and vegetables. Blood tests for lipids and cholesterol should begin at age 20 and be repeated every 5 years. If your lipid or cholesterol levels are high, you are over 50, or you are at high risk for heart disease, you may need your cholesterol levels checked more frequently. Ongoing high lipid and cholesterol levels should be   treated with medicines if diet and exercise are not working. If you smoke, find out from your health care provider how to quit. If you do not use tobacco, do not start. Lung cancer screening is recommended for adults aged 55-80 years who are at high risk for developing lung cancer because of a history of smoking. A yearly low-dose CT scan of the lungs is recommended for people who have at least a 30-pack-year history of smoking and are a current smoker or have quit within the past 15 years. A pack year of smoking is smoking an average of 1 pack of cigarettes a day for 1 year (for example: 1 pack a day for 30 years or 2 packs a day for 15 years). Yearly screening should continue until the smoker has stopped smoking for at least 15 years. Yearly screening should be stopped for people who develop a health problem that would prevent them from having lung cancer treatment. If you choose to drink alcohol, do not have more than 2 drinks per day. One drink is considered to be 12 ounces (355 mL) of beer, 5 ounces (148 mL) of wine, or 1.5 ounces (44  mL) of liquor. Avoid use of street drugs. Do not share needles with anyone. Ask for help if you need support or instructions about stopping the use of drugs. High blood pressure causes heart disease and increases the risk of stroke. Your blood pressure should be checked at least every 1-2 years. Ongoing high blood pressure should be treated with medicines, if weight loss and exercise are not effective. If you are 45-79 years old, ask your health care provider if you should take aspirin to prevent heart disease. Diabetes screening involves taking a blood sample to check your fasting blood sugar level. Testing should be considered at a younger age or be carried out more frequently if you are overweight and have at least 1 risk factor for diabetes. Colorectal cancer can be detected and often prevented. Most routine colorectal cancer screening begins at the age of 50 and continues through age 75. However, your health care provider may recommend screening at an earlier age if you have risk factors for colon cancer. On a yearly basis, your health care provider may provide home test kits to check for hidden blood in the stool. Use of a small camera at the end of a tube to directly examine the colon (sigmoidoscopy or colonoscopy) can detect the earliest forms of colorectal cancer. Talk to your health care provider about this at age 50, when routine screening begins. Direct exam of the colon should be repeated every 5-10 years through age 75, unless early forms of precancerous polyps or small growths are found. Hepatitis C blood testing is recommended for all people born from 1945 through 1965 and any individual with known risks for hepatitis C. Screening for abdominal aortic aneurysm (AAA)  by ultrasound is recommended for people who have history of high blood pressure or who are current or former smokers. Healthy men should  receive prostate-specific antigen (PSA) blood tests as part of routine cancer screening.  Talk with your health care provider about prostate cancer screening. Testicular cancer screening is  recommended for adult males. Screening includes self-exam, a health care provider exam, and other screening tests. Consult with your health care provider about any symptoms you have or any concerns you have about testicular cancer. Use sunscreen. Apply sunscreen liberally and repeatedly throughout the day. You should seek shade when your shadow is shorter than   you. Protect yourself by wearing long sleeves, pants, a wide-brimmed hat, and sunglasses year round, whenever you are outdoors. Once a month, do a whole-body skin exam, using a mirror to look at the skin on your back. Tell your health care provider about new moles, moles that have irregular borders, moles that are larger than a pencil eraser, or moles that have changed in shape or color. Stay current with required vaccines (immunizations). Influenza vaccine. All adults should be immunized every year. Tetanus, diphtheria, and acellular pertussis (Td, Tdap) vaccine. An adult who has not previously received Tdap or who does not know his vaccine status should receive 1 dose of Tdap. This initial dose should be followed by tetanus and diphtheria toxoids (Td) booster doses every 10 years. Adults with an unknown or incomplete history of completing a 3-dose immunization series with Td-containing vaccines should begin or complete a primary immunization series including a Tdap dose. Adults should receive a Td booster every 10 years. Zoster vaccine. One dose is recommended for adults aged 60 years or older unless certain conditions are present.  PREVNAR - Pneumococcal 13-valent conjugate (PCV13) vaccine. When indicated, a person who is uncertain of his immunization history and has no record of immunization should receive the PCV13 vaccine. An adult aged 19 years or older who has certain medical conditions and has not been previously immunized should receive 1  dose of PCV13 vaccine. This PCV13 should be followed with a dose of pneumococcal polysaccharide (PPSV23) vaccine. The PPSV23 vaccine dose should be obtained 1 or more year(s)after the dose of PCV13 vaccine. An adult aged 19 years or older who has certain medical conditions and previously received 1 or more doses of PPSV23 vaccine should receive 1 dose of PCV13. The PCV13 vaccine dose should be obtained 1 or more years after the last PPSV23 vaccine dose.  PNEUMOVAX - Pneumococcal polysaccharide (PPSV23) vaccine. When PCV13 is also indicated, PCV13 should be obtained first. All adults aged 65 years and older should be immunized. An adult younger than age 65 years who has certain medical conditions should be immunized. Any person who resides in a nursing home or long-term care facility should be immunized. An adult smoker should be immunized. People with an immunocompromised condition and certain other conditions should receive both PCV13 and PPSV23 vaccines. People with human immunodeficiency virus (HIV) infection should be immunized as soon as possible after diagnosis. Immunization during chemotherapy or radiation therapy should be avoided. Routine use of PPSV23 vaccine is not recommended for American Indians, [place] Natives, or people younger than 65 years unless there are medical conditions that require PPSV23 vaccine. When indicated, people who have unknown immunization and have no record of immunization should receive PPSV23 vaccine. One-time revaccination 5 years after the first dose of PPSV23 is recommended for people aged 19-64 years who have chronic kidney failure, nephrotic syndrome, asplenia, or immunocompromised conditions. People who received 1-2 doses of PPSV23 before age 65 years should receive another dose of PPSV23 vaccine at age 65 years or later if at least 5 years have passed since the previous dose. Doses of PPSV23 are not needed for people immunized with PPSV23 at or after age 65  years.  Hepatitis A vaccine. Adults who wish to be protected from this disease, have certain high-risk conditions, work with hepatitis A-infected animals, work in hepatitis A research labs, or travel to or work in countries with a high rate of hepatitis A should be immunized. Adults who were previously unvaccinated and who anticipate close contact   with an international adoptee during the first 60 days after arrival in the [place] from a country with a high rate of hepatitis A should be immunized.  Hepatitis B vaccine. Adults should be immunized if they wish to be protected from this disease, have certain high-risk conditions, may be exposed to blood or other infectious body fluids, are household contacts or sex partners of hepatitis B positive people, are clients or workers in certain care facilities, or travel to or work in countries with a high rate of hepatitis B.  Preventive Service / Frequency  Ages 65 and over Blood pressure check. Lipid and cholesterol check. Lung cancer screening. / Every year if you are aged 55-80 years and have a 30-pack-year history of smoking and currently smoke or have quit within the past 15 years. Yearly screening is stopped once you have quit smoking for at least 15 years or develop a health problem that would prevent you from having lung cancer treatment. Fecal occult blood test (FOBT) of stool. You may not have to do this test if you get a colonoscopy every 10 years. Flexible sigmoidoscopy** or colonoscopy.** / Every 5 years for a flexible sigmoidoscopy or every 10 years for a colonoscopy beginning at age 50 and continuing until age 75. Hepatitis C blood test.** / For all people born from 1945 through 1965 and any individual with known risks for hepatitis C. Abdominal aortic aneurysm (AAA) screening./ Screening current or former smokers or have Hypertension. Skin self-exam. / Monthly. Influenza vaccine. / Every year. Tetanus, diphtheria, and acellular  pertussis (Tdap/Td) vaccine.** / 1 dose of Td every 10 years.  Zoster vaccine.** / 1 dose for adults aged 60 years or older.         Pneumococcal 13-valent conjugate (PCV13) vaccine.   Pneumococcal polysaccharide (PPSV23) vaccine.   Hepatitis A vaccine.** / Consult your health care provider. Hepatitis B vaccine.** / Consult your health care provider. Screening for abdominal aortic aneurysm (AAA)  by ultrasound is recommended for people who have history of high blood pressure or who are current or former smokers. ++++++++++ Recommend Adult Low Dose Aspirin or  coated  Aspirin 81 mg daily  To reduce risk of Colon Cancer 40 %,  Skin Cancer 26 % ,  Malignant Melanoma 46%  and  Pancreatic cancer 60% ++++++++++++++++++++++ Vitamin D goal  is between 70-100.  Please make sure that you are taking your Vitamin D as directed.  It is very important as a natural anti-inflammatory  helping hair, skin, and nails, as well as reducing stroke and heart attack risk.  It helps your bones and helps with mood. It also decreases numerous cancer risks so please take it as directed.  Low Vit D is associated with a 200-300% higher risk for CANCER  and 200-300% higher risk for HEART   ATTACK  &  STROKE.   ...................................... It is also associated with higher death rate at younger ages,  autoimmune diseases like Rheumatoid arthritis, Lupus, Multiple Sclerosis.    Also many other serious conditions, like depression, Alzheimer's Dementia, infertility, muscle aches, fatigue, fibromyalgia - just to name a few. ++++++++++++++++++++++ Recommend the book "The END of DIETING" by Dr Mykael Fuhrman  & the book "The END of DIABETES " by Dr Alias Fuhrman At [employer] - get book & Audio CD's    Being diabetic has a  300% increased risk for heart attack, stroke, cancer, and alzheimer- type vascular dementia. It is very important that you work harder with diet by   avoiding all foods that are white. Avoid  white rice (brown & wild rice is OK), white potatoes (sweetpotatoes in moderation is OK), White bread or wheat bread or anything made out of white flour like bagels, donuts, rolls, buns, biscuits, cakes, pastries, cookies, pizza crust, and pasta (made from white flour & egg whites) - vegetarian pasta or spinach or wheat pasta is OK. Multigrain breads like Arnold's or Pepper[employer], or multigrain sandwich thins or flatbreads.  Diet, exercise and weight loss can reverse and cure diabetes in the early stages.  Diet, exercise and weight loss is very important in the control and prevention of complications of diabetes which affects every system in your body, ie. Brain - dementia/stroke, eyes - glaucoma/blindness, heart - heart attack/heart failure, kidneys - dialysis, stomach - gastric paralysis, intestines - malabsorption, nerves - severe painful neuritis, circulation - gangrene & loss of a leg(s), and finally cancer and Alzheimers.    I recommend avoid fried & greasy foods,  sweets/candy, white rice (brown or wild rice or Quinoa is OK), white potatoes (sweet potatoes are OK) - anything made from white flour - bagels, doughnuts, rolls, buns, biscuits,white and wheat breads, pizza crust and traditional pasta made of white flour & egg white(vegetarian pasta or spinach or wheat pasta is OK).  Multi-grain bread is OK - like multi-grain flat bread or sandwich thins. Avoid alcohol in excess. Exercise is also important.    Eat all the vegetables you want - avoid meat, especially red meat and dairy - especially cheese.  Cheese is the most concentrated form of trans-fats which is the worst thing to clog up our arteries. Veggie cheese is OK which can be found in the fresh produce section at [employer] or [employer] or [employer]  ++++++++++++++++++++++ DASH Eating Plan  DASH stands for "Dietary Approaches to Stop Hypertension."   The DASH eating plan is a healthy eating plan that has been shown to reduce high  blood pressure (hypertension). Additional health benefits may include reducing the risk of type 2 diabetes mellitus, heart disease, and stroke. The DASH eating plan may also help with weight loss. WHAT DO I NEED TO KNOW ABOUT THE DASH EATING PLAN? For the DASH eating plan, you will follow these general guidelines: Choose foods with a percent daily value for sodium of less than 5% (as listed on the food label). Use salt-free seasonings or herbs instead of table salt or sea salt. Check with your health care provider or pharmacist before using salt substitutes. Eat lower-sodium products, often labeled as "lower sodium" or "no salt added." Eat fresh foods. Eat more vegetables, fruits, and low-fat dairy products. Choose whole grains. Look for the word "whole" as the first word in the ingredient list. Choose fish  Limit sweets, desserts, sugars, and sugary drinks. Choose heart-healthy fats. Eat veggie cheese  Eat more home-cooked food and less restaurant, buffet, and fast food. Limit fried foods. Cook foods using methods other than frying. Limit canned vegetables. If you do use them, rinse them well to decrease the sodium. When eating at a restaurant, ask that your food be prepared with less salt, or no salt if possible.                      WHAT FOODS CAN I EAT? Read Dr Ryken Fuhrman's books on The End of Dieting & The End of Diabetes  Grains Whole grain or whole wheat bread. Brown rice. Whole grain or whole wheat pasta. Quinoa, bulgur, and   whole grain cereals. Low-sodium cereals. Corn or whole wheat flour tortillas. Whole grain cornbread. Whole grain crackers. Low-sodium crackers.  Vegetables Fresh or frozen vegetables (raw, steamed, roasted, or grilled). Low-sodium or reduced-sodium tomato and vegetable juices. Low-sodium or reduced-sodium tomato sauce and paste. Low-sodium or reduced-sodium canned vegetables.   Fruits All fresh, canned (in natural juice), or frozen fruits.  Protein  Products  All fish and seafood.  Dried beans, peas, or lentils. Unsalted nuts and seeds. Unsalted canned beans.  Dairy Low-fat dairy products, such as skim or 1% milk, 2% or reduced-fat cheeses, low-fat ricotta or cottage cheese, or plain low-fat yogurt. Low-sodium or reduced-sodium cheeses.  Fats and Oils Tub margarines without trans fats. Light or reduced-fat mayonnaise and salad dressings (reduced sodium). Avocado. Safflower, olive, or canola oils. Natural peanut or almond butter.  Other Unsalted popcorn and pretzels. The items listed above may not be a complete list of recommended foods or beverages. Contact your dietitian for more options.  ++++++++++++++++++++  WHAT FOODS ARE NOT RECOMMENDED? Grains/ White flour or wheat flour White bread. White pasta. White rice. Refined cornbread. Bagels and croissants. Crackers that contain trans fat.  Vegetables  Creamed or fried vegetables. Vegetables in a . Regular canned vegetables. Regular canned tomato sauce and paste. Regular tomato and vegetable juices.  Fruits Dried fruits. Canned fruit in light or heavy syrup. Fruit juice.  Meat and Other Protein Products Meat in general - RED meat & White meat.  Fatty cuts of meat. Ribs, chicken wings, all processed meats as bacon, sausage, bologna, salami, fatback, hot dogs, bratwurst and packaged luncheon meats.  Dairy Whole or 2% milk, cream, half-and-half, and cream cheese. Whole-fat or sweetened yogurt. Full-fat cheeses or blue cheese. Non-dairy creamers and whipped toppings. Processed cheese, cheese spreads, or cheese curds.  Condiments Onion and garlic salt, seasoned salt, table salt, and sea salt. Canned and packaged gravies. Worcestershire sauce. Tartar sauce. Barbecue sauce. Teriyaki sauce. Soy sauce, including reduced sodium. Steak sauce. Fish sauce. Oyster sauce. Cocktail sauce. Horseradish. Ketchup and mustard. Meat flavorings and tenderizers. Bouillon cubes. Hot sauce. Tabasco sauce.  Marinades. Taco seasonings. Relishes.  Fats and Oils Butter, stick margarine, lard, shortening and bacon fat. Coconut, palm kernel, or palm oils. Regular salad dressings.  Pickles and olives. Salted popcorn and pretzels.  The items listed above may not be a complete list of foods and beverages to avoid.   

## 2022-07-04 ENCOUNTER — Ambulatory Visit (INDEPENDENT_AMBULATORY_CARE_PROVIDER_SITE_OTHER): Payer: BC Managed Care – PPO | Admitting: Internal Medicine

## 2022-07-04 ENCOUNTER — Encounter: Payer: Self-pay | Admitting: Internal Medicine

## 2022-07-04 VITALS — BP 138/70 | HR 76 | Temp 97.9°F | Resp 16 | Ht 65.0 in | Wt 196.0 lb

## 2022-07-04 DIAGNOSIS — Z0000 Encounter for general adult medical examination without abnormal findings: Secondary | ICD-10-CM | POA: Diagnosis not present

## 2022-07-04 DIAGNOSIS — I700 Atherosclerosis of aorta: Secondary | ICD-10-CM

## 2022-07-04 DIAGNOSIS — R7309 Other abnormal glucose: Secondary | ICD-10-CM

## 2022-07-04 DIAGNOSIS — Z130 Encounter for screening for diseases of the blood and blood-forming organs and certain disorders involving the immune mechanism: Secondary | ICD-10-CM | POA: Diagnosis not present

## 2022-07-04 DIAGNOSIS — Z0001 Encounter for general adult medical examination with abnormal findings: Secondary | ICD-10-CM

## 2022-07-04 DIAGNOSIS — Z13220 Encounter for screening for lipoid disorders: Secondary | ICD-10-CM | POA: Diagnosis not present

## 2022-07-04 DIAGNOSIS — Z1389 Encounter for screening for other disorder: Secondary | ICD-10-CM | POA: Diagnosis not present

## 2022-07-04 DIAGNOSIS — Z125 Encounter for screening for malignant neoplasm of prostate: Secondary | ICD-10-CM | POA: Diagnosis not present

## 2022-07-04 DIAGNOSIS — Z136 Encounter for screening for cardiovascular disorders: Secondary | ICD-10-CM

## 2022-07-04 DIAGNOSIS — N401 Enlarged prostate with lower urinary tract symptoms: Secondary | ICD-10-CM

## 2022-07-04 DIAGNOSIS — Z1329 Encounter for screening for other suspected endocrine disorder: Secondary | ICD-10-CM | POA: Diagnosis not present

## 2022-07-04 DIAGNOSIS — Z1211 Encounter for screening for malignant neoplasm of colon: Secondary | ICD-10-CM

## 2022-07-04 DIAGNOSIS — E349 Endocrine disorder, unspecified: Secondary | ICD-10-CM

## 2022-07-04 DIAGNOSIS — G4733 Obstructive sleep apnea (adult) (pediatric): Secondary | ICD-10-CM

## 2022-07-04 DIAGNOSIS — Z79899 Other long term (current) drug therapy: Secondary | ICD-10-CM

## 2022-07-04 DIAGNOSIS — Z111 Encounter for screening for respiratory tuberculosis: Secondary | ICD-10-CM

## 2022-07-04 DIAGNOSIS — Z131 Encounter for screening for diabetes mellitus: Secondary | ICD-10-CM

## 2022-07-04 DIAGNOSIS — E782 Mixed hyperlipidemia: Secondary | ICD-10-CM

## 2022-07-04 DIAGNOSIS — E559 Vitamin D deficiency, unspecified: Secondary | ICD-10-CM | POA: Diagnosis not present

## 2022-07-04 DIAGNOSIS — R350 Frequency of micturition: Secondary | ICD-10-CM | POA: Diagnosis not present

## 2022-07-04 DIAGNOSIS — Z87891 Personal history of nicotine dependence: Secondary | ICD-10-CM

## 2022-07-04 DIAGNOSIS — Z8249 Family history of ischemic heart disease and other diseases of the circulatory system: Secondary | ICD-10-CM

## 2022-07-04 DIAGNOSIS — I10 Essential (primary) hypertension: Secondary | ICD-10-CM | POA: Diagnosis not present

## 2022-07-04 DIAGNOSIS — N138 Other obstructive and reflux uropathy: Secondary | ICD-10-CM

## 2022-07-04 DIAGNOSIS — R5383 Other fatigue: Secondary | ICD-10-CM

## 2022-07-04 MED ORDER — TAMSULOSIN HCL 0.4 MG PO CAPS
ORAL_CAPSULE | ORAL | 3 refills | Status: DC
Start: 2022-07-04 — End: 2023-03-18

## 2022-07-04 MED ORDER — TADALAFIL 5 MG PO TABS
ORAL_TABLET | ORAL | 3 refills | Status: DC
Start: 2022-07-04 — End: 2023-03-18

## 2022-07-05 LAB — VITAMIN D 25 HYDROXY (VIT D DEFICIENCY, FRACTURES): Vit D, 25-Hydroxy: 92 ng/mL (ref 30–100)

## 2022-07-05 LAB — TESTOSTERONE: Testosterone: 937 ng/dL — ABNORMAL HIGH (ref 250–827)

## 2022-07-05 LAB — COMPLETE METABOLIC PANEL WITH GFR
AST: 26 U/L (ref 10–35)
Alkaline phosphatase (APISO): 49 U/L (ref 35–144)
BUN: 22 mg/dL (ref 7–25)
Calcium: 9.6 mg/dL (ref 8.6–10.3)
Sodium: 140 mmol/L (ref 135–146)
eGFR: 71 mL/min/{1.73_m2} (ref >=60)

## 2022-07-05 LAB — MAGNESIUM: Magnesium: 2 mg/dL (ref 1.5–2.5)

## 2022-07-05 LAB — CBC WITH DIFFERENTIAL/PLATELET
Eosinophils Absolute: 71 cells/uL (ref 15–500)
Hemoglobin: 15 g/dL (ref 13.2–17.1)
Monocytes Relative: 12.2 %
Neutrophils Relative %: 60.7 %
Platelets: 178 10*3/uL (ref 140–400)
RDW: 12.1 % (ref 11.0–15.0)

## 2022-07-05 LAB — URINALYSIS, ROUTINE W REFLEX MICROSCOPIC
Hyaline Cast: NONE SEEN /LPF
Leukocytes,Ua: NEGATIVE
Nitrite: NEGATIVE

## 2022-07-05 LAB — HEMOGLOBIN A1C: eAG (mmol/L): 6.2 mmol/L

## 2022-07-05 LAB — MICROALBUMIN / CREATININE URINE RATIO: Creatinine, Urine: 59 mg/dL (ref 20–320)

## 2022-07-05 NOTE — Progress Notes (Signed)
^<^<^<^<^<^<^<^<^<^<^<^<^<^<^<^<^<^<^<^<^<^<^<^<^<^<^<^<^<^<^<^<^<^<^<^<^ ^>^>^>^>^>^>^>^>^>^>^>>^>^>^>^>^>^>^>^>^>^>^>^>^>^>^>^>^>^>^>^>^>^>^>^>^>  -  Test results slightly outside the reference range are not unusual. If there is anything important, I will review this with you,  otherwise it is considered normal test values.  If you have further questions,  please do not hesitate to contact me at the office or via My Chart.   ^<^<^<^<^<^<^<^<^<^<^<^<^<^<^<^<^<^<^<^<^<^<^<^<^<^<^<^<^<^<^<^<^<^<^<^<^ ^>^>^>^>^>^>^>^>^>^>^>^>^>^>^>^>^>^>^>^>^>^>^>^>^>^>^>^>^>^>^>^>^>^>^>^>^  - Testosterone level is elevated from recent shot ^<^<^<^<^<^<^<^<^<^<^<^<^<^<^<^<^<^<^<^<^<^<^<^<^<^<^<^<^<^<^<^<^<^<^<^<^ ^>^>^>^>^>^>^>^>^>^>^>^>^>^>^>^>^>^>^>^>^>^>^>^>^>^>^>^>^>^>^>^>^>^>^>^>^  - Iron Levels & Vitamin B12 levels are Normal  ^<^<^<^<^<^<^<^<^<^<^<^<^<^<^<^<^<^<^<^<^<^<^<^<^<^<^<^<^<^<^<^<^<^<^<^<^ ^>^>^>^>^>^>^>^>^>^>^>^>^>^>^>^>^>^>^>^>^>^>^>^>^>^>^>^>^>^>^>^>^>^>^>^>^  - PSA is low - No Prostate cancer - Great  ! ^<^<^<^<^<^<^<^<^<^<^<^<^<^<^<^<^<^<^<^<^<^<^<^<^<^<^<^<^<^<^<^<^<^<^<^<^ ^>^>^>^>^>^>^>^>^>^>^>^>^>^>^>^>^>^>^>^>^>^>^>^>^>^>^>^>^>^>^>^>^>^>^>^>^  - Chol = 111 &  LDL Chol = 40   - Both  Excellent   - Very low risk for Heart Attack  / Stroke ^<^<^<^<^<^<^<^<^<^<^<^<^<^<^<^<^<^<^<^<^<^<^<^<^<^<^<^<^<^<^<^<^<^<^<^<^ ^>^>^>^>^>^>^>^>^>^>^>^>^>^>^>^>^>^>^>^>^>^>^>^>^>^>^>^>^>^>^>^>^>^>^>^>^  - A1c - Normal - No Diabetes - Great !  ^<^<^<^<^<^<^<^<^<^<^<^<^<^<^<^<^<^<^<^<^<^<^<^<^<^<^<^<^<^<^<^<^<^<^<^<^ ^>^>^>^>^>^>^>^>^>^>^>^>^>^>^>^>^>^>^>^>^>^>^>^>^>^>^>^>^>^>^>^>^>^>^>^>^  - Vitamin D= 92 - Excellent - Please  keep dose same  ^<^<^<^<^<^<^<^<^<^<^<^<^<^<^<^<^<^<^<^<^<^<^<^<^<^<^<^<^<^<^<^<^<^<^<^<^ ^>^>^>^>^>^>^>^>^>^>^>^>^>^>^>^>^>^>^>^>^>^>^>^>^>^>^>^>^>^>^>^>^>^>^>^>^  - All Else - CBC - Kidneys - Electrolytes - Liver - Magnesium & Thyroid    - all  Normal /  OK ^<^<^<^<^<^<^<^<^<^<^<^<^<^<^<^<^<^<^<^<^<^<^<^<^<^<^<^<^<^<^<^<^<^<^<^<^ ^>^>^>^>^>^>^>^>^>^>^>^>^>^>^>^>^>^>^>^>^>^>^>^>^>^>^>^>^>^>^>^>^>^>^>^>^  - Keep up the [place] Work !  ^<^<^<^<^<^<^<^<^<^<^<^<^<^<^<^<^<^<^<^<^<^<^<^<^<^<^<^<^<^<^<^<^<^<^<^<^ ^>^>^>^>^>^>^>^>^>^>^>^>^>^>^>^>^>^>^>^>^>^>^>^>^>^>^>^>^>^>^>^>^>^>^>^>^

## 2022-07-07 LAB — URINALYSIS, ROUTINE W REFLEX MICROSCOPIC
Bacteria, UA: NONE SEEN /HPF
Bilirubin Urine: NEGATIVE
Glucose, UA: NEGATIVE
Ketones, ur: NEGATIVE
Protein, ur: NEGATIVE
Specific Gravity, Urine: 1.01 (ref 1.001–1.035)
Squamous Epithelial / HPF: NONE SEEN /HPF (ref <=5)
WBC, UA: NONE SEEN /HPF (ref 0–5)
pH: 5.5 (ref 5.0–8.0)

## 2022-07-07 LAB — HEMOGLOBIN A1C
Hgb A1c MFr Bld: 5.5 % of total Hgb (ref <5.7)
Mean Plasma Glucose: 111 mg/dL

## 2022-07-07 LAB — LIPID PANEL
Cholesterol: 111 mg/dL (ref <200)
HDL: 58 mg/dL (ref >=40)
LDL Cholesterol (Calc): 40 mg/dL (calc)
Non-HDL Cholesterol (Calc): 53 mg/dL (calc) (ref <130)
Total CHOL/HDL Ratio: 1.9 (calc) (ref <5.0)
Triglycerides: 47 mg/dL (ref <150)

## 2022-07-07 LAB — IRON, TOTAL/TOTAL IRON BINDING CAP
%SAT: 24 % (calc) (ref 20–48)
Iron: 81 ug/dL (ref 50–180)
TIBC: 335 mcg/dL (calc) (ref 250–425)

## 2022-07-07 LAB — PSA: PSA: 1.01 ng/mL (ref <=4.00)

## 2022-07-07 LAB — CBC WITH DIFFERENTIAL/PLATELET
Absolute Monocytes: 622 cells/uL (ref 200–950)
Basophils Absolute: 92 cells/uL (ref 0–200)
Basophils Relative: 1.8 %
Eosinophils Relative: 1.4 %
HCT: 45 % (ref 38.5–50.0)
Lymphs Abs: 1219 cells/uL (ref 850–3900)
MCH: 32.1 pg (ref 27.0–33.0)
MCHC: 33.3 g/dL (ref 32.0–36.0)
MCV: 96.2 fL (ref 80.0–100.0)
MPV: 11.4 fL (ref 7.5–12.5)
Neutro Abs: 3096 cells/uL (ref 1500–7800)
RBC: 4.68 10*6/uL (ref 4.20–5.80)
Total Lymphocyte: 23.9 %
WBC: 5.1 10*3/uL (ref 3.8–10.8)

## 2022-07-07 LAB — COMPLETE METABOLIC PANEL WITH GFR
AG Ratio: 1.9 (calc) (ref 1.0–2.5)
ALT: 28 U/L (ref 9–46)
Albumin: 4.6 g/dL (ref 3.6–5.1)
CO2: 26 mmol/L (ref 20–32)
Chloride: 105 mmol/L (ref 98–110)
Creat: 1.15 mg/dL (ref 0.70–1.35)
Globulin: 2.4 g/dL (calc) (ref 1.9–3.7)
Glucose, Bld: 99 mg/dL (ref 65–99)
Potassium: 4.1 mmol/L (ref 3.5–5.3)
Total Bilirubin: 0.8 mg/dL (ref 0.2–1.2)
Total Protein: 7 g/dL (ref 6.1–8.1)

## 2022-07-07 LAB — VITAMIN B12: Vitamin B-12: 737 pg/mL (ref 200–1100)

## 2022-07-07 LAB — TSH: TSH: 1.01 mIU/L (ref 0.40–4.50)

## 2022-07-07 LAB — INSULIN, RANDOM: Insulin: 8.8 u[IU]/mL

## 2022-07-07 LAB — MICROALBUMIN / CREATININE URINE RATIO
Microalb Creat Ratio: 42 mg/g creat — ABNORMAL HIGH (ref <30)
Microalb, Ur: 2.5 mg/dL

## 2022-07-08 ENCOUNTER — Other Ambulatory Visit: Payer: Self-pay | Admitting: Internal Medicine

## 2022-07-08 ENCOUNTER — Emergency Department (HOSPITAL_BASED_OUTPATIENT_CLINIC_OR_DEPARTMENT_OTHER)
Admission: EM | Admit: 2022-07-08 | Discharge: 2022-07-08 | Disposition: A | Discharge status: Home / Self Care | Payer: BC Managed Care – PPO | Attending: Emergency Medicine | Admitting: Emergency Medicine

## 2022-07-08 ENCOUNTER — Encounter (HOSPITAL_BASED_OUTPATIENT_CLINIC_OR_DEPARTMENT_OTHER): Payer: Self-pay

## 2022-07-08 ENCOUNTER — Other Ambulatory Visit (HOSPITAL_BASED_OUTPATIENT_CLINIC_OR_DEPARTMENT_OTHER): Payer: Self-pay

## 2022-07-08 ENCOUNTER — Emergency Department (HOSPITAL_BASED_OUTPATIENT_CLINIC_OR_DEPARTMENT_OTHER): Payer: BC Managed Care – PPO

## 2022-07-08 ENCOUNTER — Other Ambulatory Visit: Payer: Self-pay

## 2022-07-08 DIAGNOSIS — N132 Hydronephrosis with renal and ureteral calculous obstruction: Secondary | ICD-10-CM | POA: Diagnosis not present

## 2022-07-08 DIAGNOSIS — Z79899 Other long term (current) drug therapy: Secondary | ICD-10-CM | POA: Insufficient documentation

## 2022-07-08 DIAGNOSIS — N23 Unspecified renal colic: Secondary | ICD-10-CM

## 2022-07-08 DIAGNOSIS — R109 Unspecified abdominal pain: Secondary | ICD-10-CM | POA: Diagnosis present

## 2022-07-08 DIAGNOSIS — I10 Essential (primary) hypertension: Secondary | ICD-10-CM | POA: Diagnosis not present

## 2022-07-08 DIAGNOSIS — N200 Calculus of kidney: Secondary | ICD-10-CM

## 2022-07-08 DIAGNOSIS — N1330 Unspecified hydronephrosis: Secondary | ICD-10-CM

## 2022-07-08 DIAGNOSIS — N201 Calculus of ureter: Secondary | ICD-10-CM

## 2022-07-08 LAB — BASIC METABOLIC PANEL
Anion gap: 10 (ref 5–15)
BUN: 30 mg/dL — ABNORMAL HIGH (ref 8–23)
CO2: 23 mmol/L (ref 22–32)
Calcium: 9.1 mg/dL (ref 8.9–10.3)
Chloride: 103 mmol/L (ref 98–111)
Creatinine, Ser: 1.47 mg/dL — ABNORMAL HIGH (ref 0.61–1.24)
GFR, Estimated: 53 mL/min — ABNORMAL LOW (ref >60)
Glucose, Bld: 111 mg/dL — ABNORMAL HIGH (ref 70–99)
Potassium: 4 mmol/L (ref 3.5–5.1)
Sodium: 136 mmol/L (ref 135–145)

## 2022-07-08 LAB — URINALYSIS, ROUTINE W REFLEX MICROSCOPIC
Bilirubin Urine: NEGATIVE
Glucose, UA: NEGATIVE mg/dL
Ketones, ur: NEGATIVE mg/dL
Leukocytes,Ua: NEGATIVE
Nitrite: NEGATIVE
Protein, ur: 30 mg/dL — AB
Specific Gravity, Urine: 1.025 (ref 1.005–1.030)
pH: 5.5 (ref 5.0–8.0)

## 2022-07-08 LAB — URINALYSIS, MICROSCOPIC (REFLEX)

## 2022-07-08 LAB — CBC WITH DIFFERENTIAL/PLATELET
Abs Immature Granulocytes: 0.02 10*3/uL (ref 0.00–0.07)
Basophils Absolute: 0.1 10*3/uL (ref 0.0–0.1)
Basophils Relative: 1 %
Eosinophils Absolute: 0.1 10*3/uL (ref 0.0–0.5)
Eosinophils Relative: 1 %
HCT: 43.9 % (ref 39.0–52.0)
Hemoglobin: 14.7 g/dL (ref 13.0–17.0)
Immature Granulocytes: 0 %
Lymphocytes Relative: 11 %
Lymphs Abs: 0.8 10*3/uL (ref 0.7–4.0)
MCH: 31.7 pg (ref 26.0–34.0)
MCHC: 33.5 g/dL (ref 30.0–36.0)
MCV: 94.6 fL (ref 80.0–100.0)
Monocytes Absolute: 0.7 10*3/uL (ref 0.1–1.0)
Monocytes Relative: 10 %
Neutro Abs: 5.1 10*3/uL (ref 1.7–7.7)
Neutrophils Relative %: 77 %
Platelets: 160 10*3/uL (ref 150–400)
RBC: 4.64 MIL/uL (ref 4.22–5.81)
RDW: 12.2 % (ref 11.5–15.5)
WBC: 6.8 10*3/uL (ref 4.0–10.5)
nRBC: 0 % (ref 0.0–0.2)

## 2022-07-08 MED ORDER — OXYCODONE-ACETAMINOPHEN 5-325 MG PO TABS
1.0000 | ORAL_TABLET | Freq: Four times a day (QID) | ORAL | 0 refills | Status: DC | PRN
  Filled 2022-07-08: qty 15, 4d supply, fill #0

## 2022-07-08 MED ORDER — ONDANSETRON HCL 4 MG/2ML IJ SOLN
4.0000 mg | Freq: Once | INTRAMUSCULAR | Status: AC
  Administered 2022-07-08: 4 mg via INTRAVENOUS
  Filled 2022-07-08: qty 2

## 2022-07-08 MED ORDER — MORPHINE SULFATE (PF) 4 MG/ML IV SOLN
4.0000 mg | Freq: Once | INTRAVENOUS | Status: AC
  Administered 2022-07-08: 4 mg via INTRAVENOUS
  Filled 2022-07-08: qty 1

## 2022-07-08 MED ORDER — KETOROLAC TROMETHAMINE 15 MG/ML IJ SOLN
15.0000 mg | Freq: Once | INTRAMUSCULAR | Status: AC
  Administered 2022-07-08: 15 mg via INTRAVENOUS
  Filled 2022-07-08: qty 1

## 2022-07-08 NOTE — ED Notes (Signed)
Patient transported to CT 

## 2022-07-08 NOTE — ED Triage Notes (Signed)
Started having left sided flank pain radiating around to abdomen since 0500. Denies N/V or urinary complaints.  HX of kidney stones, feels similar.

## 2022-07-08 NOTE — ED Provider Notes (Signed)
[place] [facility] Provider Note   CSN: [id number] Arrival date & time: 07/08/22  0746     History  Chief Complaint  Patient presents with   Flank Pain    [name] is a 62 y.o. male.  Patient is a 62 year old male with a history of kidney stones, hyperlipidemia, hypertension, BPH who is presenting today with complaints of sudden onset of left flank pain that started this morning after he got up.  He initially thought it was a cramp but it became worse.  He has noticed his urine is cloudy but denies nausea and vomiting.  He last had a kidney stone over 10 years ago that required stenting and states this feels similar.  He has not had any recent dysuria, frequency or urgency.  No fevers.  The history is provided by the patient, the spouse and medical records.  Flank Pain       Home Medications Prior to Admission medications   Medication Sig Start Date End Date Taking? Authorizing Provider  oxyCODONE-acetaminophen (PERCOCET/ROXICET) 5-325 MG tablet Take 1 tablet by mouth every 6 (six) hours as needed for severe pain. 07/08/22  Yes Gwyneth Sprout, MD  amLODipine (NORVASC) 10 MG tablet Take  1 tablet  Daily  for BP 06/24/21   Lucky Cowboy, MD  atorvastatin (LIPITOR) 40 MG tablet TAKE ONE TABLET BY MOUTH DAILY FOR CHOLESTEROL 08/05/21   Raynelle Dick, NP  B-D 3CC LUER-LOK SYR 21GX1" 21G X 1" 3 ML MISC USE AS DIRECTED 01/20/20   Lucky Cowboy, MD  Cholecalciferol (VITAMIN D3) 250 MCG (10000 UT) capsule Take  1 capsule  Daily 05/15/21   Lucky Cowboy, MD  clonazePAM (KLONOPIN) 1 MG tablet TAKE 1/2 TO 1 TABLET  EVERY NIGHT AT BEDTIME IF NEEDED FOR SLEEP  **LIMIT TO 5 DAYS A WEEK TO AVOID ADDICTION AND DEMENTIA** 04/06/22   Lucky Cowboy, MD  ezetimibe (ZETIA) 10 MG tablet Take one tablet daily for cholesterol 06/25/21   Lucky Cowboy, MD  finasteride (PROSCAR) 5 MG tablet Take  1 tablet  Daily  for Prostate 09/18/21   Lucky Cowboy,  MD  loratadine-pseudoephedrine (LORATADINE-D 24HR) 10-240 MG 24 hr tablet TAKE ONE TABLET BY MOUTH DAILY FOR ALLERGIES AND CONGESTION 08/22/21   Lucky Cowboy, MD  Multiple Vitamin (MULTIVITAMIN) tablet Take 1 tablet by mouth daily.     [provider]  olmesartan (BENICAR) 20 MG tablet Take 1 tab daily for blood pressure. 01/30/22   Lucky Cowboy, MD  tadalafil (CIALIS) 5 MG tablet Take  1 tablet  Daily for Prostate                                                         /                                                                              TAKE  BY                                                    MOUTH 07/04/22   Lucky Cowboy, MD  tamsulosin (FLOMAX) 0.4 MG CAPS capsule Take  1 tablet   2 x / day  for Prostate                                                       /                          TAKE                                  BY                          MOUTH 07/04/22   Lucky Cowboy, MD  testosterone cypionate (DEPOTESTOSTERONE CYPIONATE) 200 MG/ML injection Inject 1 ml into muscle each week 04/30/22   Lucky Cowboy, MD  Zinc 50 MG TABS Take by mouth.    [provider]      Allergies    Penicillins, Keflex [cephalexin], Pregabalin, and Ace inhibitors    Review of Systems   Review of Systems  Genitourinary:  Positive for flank pain.    Physical Exam Updated Vital Signs BP 138/74   Pulse 82   Temp 97.8 F (36.6 C) (Oral)   Resp 18   Ht 5\' 5"  (1.651 m)   Wt 88.9 kg   SpO2 97%   BMI 32.62 kg/m  Physical Exam Vitals and nursing note reviewed.  Constitutional:      General: He is not in acute distress.    Appearance: He is well-developed.  HENT:     Head: Normocephalic and atraumatic.  Eyes:     Conjunctiva/sclera: Conjunctivae normal.     Pupils: Pupils are equal, round, and reactive to light.  Cardiovascular:     Rate and Rhythm: Normal rate and regular rhythm.     Heart sounds: No murmur  heard. Pulmonary:     Effort: Pulmonary effort is normal. No respiratory distress.     Breath sounds: Normal breath sounds. No wheezing or rales.  Abdominal:     General: There is no distension.     Palpations: Abdomen is soft.     Tenderness: There is no abdominal tenderness. There is left CVA tenderness. There is no guarding or rebound.  Musculoskeletal:        General: No tenderness. Normal range of motion.     Cervical back: Normal range of motion and neck supple.  Skin:    General: Skin is warm and dry.     Findings: No erythema or rash.  Neurological:     Mental Status: He is alert and oriented to person, place, and time.  Psychiatric:        Behavior: Behavior normal.     ED Results / Procedures / Treatments   Labs (all labs ordered are listed, but only abnormal results are displayed) Labs Reviewed  URINALYSIS,  ROUTINE W REFLEX MICROSCOPIC - Abnormal; Notable for the following components:      Result Value   APPearance HAZY (*)    Hgb urine dipstick LARGE (*)    Protein, ur 30 (*)    All other components within normal limits  BASIC METABOLIC PANEL - Abnormal; Notable for the following components:   Glucose, Bld 111 (*)    BUN 30 (*)    Creatinine, Ser 1.47 (*)    GFR, Estimated 53 (*)    All other components within normal limits  URINALYSIS, MICROSCOPIC (REFLEX) - Abnormal; Notable for the following components:   Bacteria, UA RARE (*)    All other components within normal limits  CBC WITH DIFFERENTIAL/PLATELET    EKG None  Radiology CT Renal Stone Study  Result Date: 07/08/2022 CLINICAL DATA:  Abdominal/flank pain, kidney stone suspected. EXAM: CT ABDOMEN AND PELVIS WITHOUT CONTRAST TECHNIQUE: Multidetector CT imaging of the abdomen and pelvis was performed following the standard protocol without IV contrast. RADIATION DOSE REDUCTION: This exam was performed according to the departmental dose-optimization program which includes automated exposure control,  adjustment of the mA and/or kV according to patient size and/or use of iterative reconstruction technique. COMPARISON:  CT examination dated March 23, 2020 FINDINGS: Lower chest: No acute abnormality. Hepatobiliary: No focal liver abnormality is seen. 4 mm and 5 mm gallstones without gallbladder wall thickening, or biliary dilatation. Pancreas: Unremarkable. No pancreatic ductal dilatation or surrounding inflammatory changes. Spleen: No splenic injury or perisplenic hematoma. Adrenals/Urinary Tract: Moderate left hydro nephrosis secondary to a 4 x 4 x 6 mm calculus in the proximal left ureter. 2-3 mm nonobstructing calculi in the lower pole of the left kidney. Right kidney and ureter are unremarkable. Urinary bladder is normal. Stomach/Bowel: Stomach is within normal limits. Appendix not identified. Scattered colonic diverticulosis. No evidence of bowel wall thickening, distention, or inflammatory changes. Vascular/Lymphatic: Aortic atherosclerosis. No enlarged abdominal or pelvic lymph nodes. Reproductive: Prostate is mildly enlarged with coarse calcifications. Other: Small bilateral fat containing inguinal hernias. Musculoskeletal: Mild multilevel degenerate disc disease of the lumbar spine. No acute osseous abnormality. IMPRESSION: 1. Moderate left hydronephrosis secondary to a 4 x 4 x 6 mm calculus in the proximal left ureter. 2. 2-3 mm nonobstructing calculi in the lower pole of the left kidney. 3. Cholelithiasis without evidence of acute cholecystitis. 4. Scattered colonic diverticulosis without evidence of acute diverticulitis. 5. 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 No acute osseous abnormality. 6. Additional chronic findings as above. 7. Aortic atherosclerosis. Electronically Signed   By: Larose Hires D.O.   On: 07/08/2022 08:50    Procedures Procedures    Medications Ordered in ED Medications  ondansetron (ZOFRAN) injection 4 mg (4 mg Intravenous Given 07/08/22 0820)  morphine (PF) 4  MG/ML injection 4 mg (4 mg Intravenous Given 07/08/22 0820)  ketorolac (TORADOL) 15 MG/ML injection 15 mg (15 mg Intravenous Given 07/08/22 0915)    ED Course/ Medical Decision Making/ A&P                             Medical Decision Making Amount and/or Complexity of Data Reviewed Independent Historian: spouse Labs: ordered. Decision-making details documented in ED Course. Radiology: ordered and independent interpretation performed. Decision-making details documented in ED Course.  Risk Prescription drug management. Parenteral controlled substances.   Pt with multiple medical problems and comorbidities and presenting today with a complaint that caries a high risk for morbidity and  mortality. Pt with symptoms consistent with kidney stone.  Denies infectious sx, or GI symptoms.  Low concern for diverticulitis and AAA.  No hx suggestive of GU source (discharge).  Will treat pain and ensure no infection with UA, CBC, CMP and will get stone study to further eval.  11:14 AM I independently interpreted patient's labs and UA with hemoglobin but no evidence of infection, CBC within normal limits, BMP with mild AKI with creatinine 1.47 from his baseline of 1.2.  I have independently visualized and interpreted pt's images today.  CT today with left-sided kidney stone.  Radiology measures 4 x 4 x 6 in the proximal left ureter.  No other acute findings noted.  On reevaluation patient reports the pain is starting to get severe.  Will give Toradol and reassess.  he is already taking tamsulosin we will have him continue that and follow-up with urology given pain control for home.           Final Clinical Impression(s) / ED Diagnoses Final diagnoses:  Kidney stone    Rx / DC Orders ED Discharge Orders          Ordered    oxyCODONE-acetaminophen (PERCOCET/ROXICET) 5-325 MG tablet  Every 6 hours PRN        07/08/22 1113              Gwyneth Sprout, MD 07/08/22 1114

## 2022-07-08 NOTE — Discharge Instructions (Signed)
Continue to take your Flomax and stay hydrated.  If you start having severe pain that is not controlled with pain medication, nausea and vomiting or any fevers you should return to the ER.

## 2022-07-10 ENCOUNTER — Other Ambulatory Visit: Payer: Self-pay | Admitting: Internal Medicine

## 2022-07-10 MED ORDER — TRAMADOL HCL 50 MG PO TABS: ORAL_TABLET | ORAL | 0 refills | Status: DC

## 2022-07-18 ENCOUNTER — Other Ambulatory Visit: Payer: Self-pay | Admitting: Internal Medicine

## 2022-07-18 DIAGNOSIS — N132 Hydronephrosis with renal and ureteral calculous obstruction: Secondary | ICD-10-CM

## 2022-07-21 ENCOUNTER — Ambulatory Visit (HOSPITAL_BASED_OUTPATIENT_CLINIC_OR_DEPARTMENT_OTHER)
Admission: RE | Admit: 2022-07-21 | Discharge: 2022-07-21 | Disposition: A | Discharge status: Home / Self Care | Payer: BC Managed Care – PPO | Source: Ambulatory Visit | Attending: Internal Medicine | Admitting: Internal Medicine

## 2022-07-21 ENCOUNTER — Other Ambulatory Visit: Payer: Self-pay | Admitting: Internal Medicine

## 2022-07-21 DIAGNOSIS — N132 Hydronephrosis with renal and ureteral calculous obstruction: Secondary | ICD-10-CM | POA: Diagnosis present

## 2022-07-21 DIAGNOSIS — F411 Generalized anxiety disorder: Secondary | ICD-10-CM

## 2022-07-22 NOTE — Progress Notes (Signed)
^<^<^<^<^<^<^<^<^<^<^<^<^<^<^<^<^<^<^<^<^<^<^<^<^<^<^<^<^<^<^<^<^<^<^<^<^ ^>^>^>^>^>^>^>^>^>^>^>>^>^>^>^>^>^>^>^>^>^>^>^>^>^>^>^>^>^>^>^>^>^>^>^>^>  -   F/U Renal CT shows  Lt ureteral stone has moved down further and                                                                                   Lt Hydronephrosis persists.   ^<^<^<^<^<^<^<^<^<^<^<^<^<^<^<^<^<^<^<^<^<^<^<^<^<^<^<^<^<^<^<^<^<^<^<^<^ ^>^>^>^>^>^>^>^>^>^>^>^>^>^>^>^>^>^>^>^>^>^>^>^>^>^>^>^>^>^>^>^>^>^>^>^>^

## 2022-07-23 ENCOUNTER — Other Ambulatory Visit: Payer: Self-pay

## 2022-07-23 DIAGNOSIS — E782 Mixed hyperlipidemia: Secondary | ICD-10-CM

## 2022-07-23 MED ORDER — ATORVASTATIN CALCIUM 40 MG PO TABS
ORAL_TABLET | ORAL | 3 refills | Status: DC
Start: 2022-07-23 — End: 2023-03-18

## 2022-08-11 ENCOUNTER — Other Ambulatory Visit: Payer: Self-pay

## 2022-08-11 DIAGNOSIS — Z1212 Encounter for screening for malignant neoplasm of rectum: Secondary | ICD-10-CM | POA: Diagnosis not present

## 2022-08-11 DIAGNOSIS — Z1211 Encounter for screening for malignant neoplasm of colon: Secondary | ICD-10-CM | POA: Diagnosis not present

## 2022-08-11 LAB — POC HEMOCCULT BLD/STL (HOME/3-CARD/SCREEN)
Card #2 Fecal Occult Blod, POC: NEGATIVE
Card #3 Fecal Occult Blood, POC: NEGATIVE
Fecal Occult Blood, POC: NEGATIVE

## 2022-08-21 ENCOUNTER — Other Ambulatory Visit: Payer: Self-pay

## 2022-08-21 DIAGNOSIS — J301 Allergic rhinitis due to pollen: Secondary | ICD-10-CM

## 2022-08-21 MED ORDER — LORATADINE-D 24HR 10-240 MG PO TB24
ORAL_TABLET | ORAL | 3 refills | Status: DC
Start: 2022-08-21 — End: 2023-03-18

## 2022-09-29 ENCOUNTER — Other Ambulatory Visit: Payer: Self-pay | Admitting: Internal Medicine

## 2022-09-29 DIAGNOSIS — K5792 Diverticulitis of intestine, part unspecified, without perforation or abscess without bleeding: Secondary | ICD-10-CM

## 2022-09-29 MED ORDER — CIPROFLOXACIN HCL 500 MG PO TABS
ORAL_TABLET | ORAL | 3 refills | Status: DC
Start: 2022-09-29 — End: 2023-03-18

## 2022-09-29 MED ORDER — METRONIDAZOLE 500 MG PO TABS
ORAL_TABLET | ORAL | 3 refills | Status: DC
Start: 2022-09-29 — End: 2023-03-18

## 2022-10-14 ENCOUNTER — Ambulatory Visit (INDEPENDENT_AMBULATORY_CARE_PROVIDER_SITE_OTHER): Payer: BC Managed Care – PPO | Admitting: Internal Medicine

## 2022-10-14 ENCOUNTER — Encounter: Payer: Self-pay | Admitting: Internal Medicine

## 2022-10-14 VITALS — BP 126/70 | HR 77 | Temp 97.7°F | Resp 16 | Ht 65.0 in | Wt 193.6 lb

## 2022-10-14 DIAGNOSIS — E782 Mixed hyperlipidemia: Secondary | ICD-10-CM | POA: Diagnosis not present

## 2022-10-14 DIAGNOSIS — E559 Vitamin D deficiency, unspecified: Secondary | ICD-10-CM

## 2022-10-14 DIAGNOSIS — Z79899 Other long term (current) drug therapy: Secondary | ICD-10-CM | POA: Diagnosis not present

## 2022-10-14 DIAGNOSIS — R7309 Other abnormal glucose: Secondary | ICD-10-CM | POA: Diagnosis not present

## 2022-10-14 DIAGNOSIS — R5383 Other fatigue: Secondary | ICD-10-CM

## 2022-10-14 DIAGNOSIS — I10 Essential (primary) hypertension: Secondary | ICD-10-CM | POA: Diagnosis not present

## 2022-10-14 LAB — CBC WITH DIFFERENTIAL/PLATELET
Absolute Monocytes: 691 cells/uL (ref 200–950)
Basophils Absolute: 70 cells/uL (ref 0–200)
Basophils Relative: 1.3 %
Eosinophils Absolute: 130 cells/uL (ref 15–500)
Eosinophils Relative: 2.4 %
HCT: 41.2 % (ref 38.5–50.0)
Hemoglobin: 13.7 g/dL (ref 13.2–17.1)
Lymphs Abs: 1312 cells/uL (ref 850–3900)
MCH: 31.8 pg (ref 27.0–33.0)
MCHC: 33.3 g/dL (ref 32.0–36.0)
MCV: 95.6 fL (ref 80.0–100.0)
MPV: 11.6 fL (ref 7.5–12.5)
Monocytes Relative: 12.8 %
Neutro Abs: 3197 cells/uL (ref 1500–7800)
Neutrophils Relative %: 59.2 %
Platelets: 188 10*3/uL (ref 140–400)
RBC: 4.31 10*6/uL (ref 4.20–5.80)
RDW: 12.8 % (ref 11.0–15.0)
Total Lymphocyte: 24.3 %
WBC: 5.4 10*3/uL (ref 3.8–10.8)

## 2022-10-14 NOTE — Patient Instructions (Signed)
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
## 2022-10-15 NOTE — Progress Notes (Signed)
^<^<^<^<^<^<^<^<^<^<^<^<^<^<^<^<^<^<^<^<^<^<^<^<^<^<^<^<^<^<^<^<^<^<^<^<^ ^>^>^>^>^>^>^>^>^>^>^>>^>^>^>^>^>^>^>^>^>^>^>^>^>^>^>^>^>^>^>^>^>^>^>^>^>  -Test results slightly outside the reference range are not unusual. If there is anything important, I will review this with you,  otherwise it is considered normal test values.  If you have further questions,  please do not hesitate to contact me at the office or via My Chart.   ^<^<^<^<^<^<^<^<^<^<^<^<^<^<^<^<^<^<^<^<^<^<^<^<^<^<^<^<^<^<^<^<^<^<^<^<^ ^>^>^>^>^>^>^>^>^>^>^>^>^>^>^>^>^>^>^>^>^>^>^>^>^>^>^>^>^>^>^>^>^>^>^>^>^  -  Kidney functions  ( GFR ) are slightly decreased  - This is usually due to NOT Drinking enough liquids / fluids  !   - Kidney functions look a little dehydrated    Very important to drink adequate amounts of fluids to prevent permanent damage    - Recommend drink at least 6 bottles (16 ounces) of fluids /water /day = 96 Oz ~100 oz  - 100 oz = 3,000 cc or 3 liters / day  - >> That's 1 &1/2 bottles of a 2 liter soda bottle /day !   ^<^<^<^<^<^<^<^<^<^<^<^<^<^<^<^<^<^<^<^<^<^<^<^<^<^<^<^<^<^<^<^<^<^<^<^<^ ^>^>^>^>^>^>^>^>^>^>^>^>^>^>^>^>^>^>^>^>^>^>^>^>^>^>^>^>^>^>^>^>^>^>^>^>^  -  Chol = 137   &  LDL = 53  -   Both   Excellent   - Very low risk for Heart Attack  / Stroke  ^>^>^>^>^>^>^>^>^>^>^>^>^>^>^>^>^>^>^>^>^>^>^>^>^>^>^>^>^>^>^>^>^>^>^>^>^ ^>^>^>^>^>^>^>^>^>^>^>^>^>^>^>^>^>^>^>^>^>^>^>^>^>^>^>^>^>^>^>^>^>^>^>^>^  -   But Triglycerides ( =  224 ) or fats in blood are too high                 (   Ideal or  Goal is less than 150  !  )    - Recommend avoid fried & greasy foods,  sweets / candy,   - Avoid white rice  (brown or wild rice or Quinoa is OK),   - Avoid white potatoes  (sweet potatoes are OK)   - Avoid anything made from white flour  - bagels, doughnuts, rolls, buns, biscuits, white and   wheat breads, pizza crust and traditional  pasta made of white flour & egg white  - (vegetarian pasta or spinach or wheat pasta is OK).    - Multi-grain bread is OK - like multi-grain flat bread or  sandwich thins.   - Avoid alcohol in excess.   - Exercise is also important. = 224  ^<^<^<^<^<^<^<^<^<^<^<^<^<^<^<^<^<^<^<^<^<^<^<^<^<^<^<^<^<^<^<^<^<^<^<^<^ ^>^>^>^>^>^>^>^>^>^>^>^>^>^>^>^>^>^>^>^>^>^>^>^>^>^>^>^>^>^>^>^>^>^>^>^>^  -  A1c - Normal - No diabetes  - Great !   ^<^<^<^<^<^<^<^<^<^<^<^<^<^<^<^<^<^<^<^<^<^<^<^<^<^<^<^<^<^<^<^<^<^<^<^<^ ^>^>^>^>^>^>^>^>^>^>^>^>^>^>^>^>^>^>^>^>^>^>^>^>^>^>^>^>^>^>^>^>^>^>^>^>^  -  Vitamin D = - Please keep dosage  same   ^<^<^<^<^<^<^<^<^<^<^<^<^<^<^<^<^<^<^<^<^<^<^<^<^<^<^<^<^<^<^<^<^<^<^<^<^ ^>^>^>^>^>^>^>^>^>^>^>^>^>^>^>^>^>^>^>^>^>^>^>^>^>^>^>^>^>^>^>^>^>^>^>^>^  -  All Else - CBC - Kidneys - Electrolytes - Liver - Magnesium & Thyroid    - all  Normal / OK  ^<^<^<^<^<^<^<^<^<^<^<^<^<^<^<^<^<^<^<^<^<^<^<^<^<^<^<^<^<^<^<^<^<^<^<^<^ ^>^>^>^>^>^>^>^>^>^>^>^>^>^>^>^>^>^>^>^>^>^>^>^>^>^>^>^>^>^>^>^>^>^>^>^>^

## 2022-10-22 ENCOUNTER — Other Ambulatory Visit: Payer: Self-pay | Admitting: Internal Medicine

## 2022-10-22 DIAGNOSIS — F411 Generalized anxiety disorder: Secondary | ICD-10-CM

## 2022-10-22 MED ORDER — CLONAZEPAM 1 MG PO TABS
ORAL_TABLET | ORAL | 0 refills | Status: DC
Start: 2022-10-22 — End: 2023-01-12

## 2022-11-22 ENCOUNTER — Other Ambulatory Visit: Payer: Self-pay | Admitting: Internal Medicine

## 2022-11-22 MED ORDER — DEXAMETHASONE 4 MG PO TABS: ORAL_TABLET | ORAL | 0 refills | Status: DC

## 2022-11-22 MED ORDER — DOXYCYCLINE HYCLATE 100 MG PO CAPS: ORAL_CAPSULE | ORAL | 0 refills | Status: DC

## 2022-12-17 ENCOUNTER — Other Ambulatory Visit: Payer: Self-pay

## 2022-12-17 DIAGNOSIS — N138 Other obstructive and reflux uropathy: Secondary | ICD-10-CM

## 2022-12-17 DIAGNOSIS — R0989 Other specified symptoms and signs involving the circulatory and respiratory systems: Secondary | ICD-10-CM

## 2022-12-17 MED ORDER — FINASTERIDE 5 MG PO TABS
ORAL_TABLET | ORAL | 3 refills | Status: DC
Start: 2022-12-17 — End: 2023-03-18

## 2022-12-17 MED ORDER — AMLODIPINE BESYLATE 10 MG PO TABS
ORAL_TABLET | ORAL | 3 refills | Status: DC
Start: 2022-12-17 — End: 2023-03-18

## 2022-12-17 MED ORDER — EZETIMIBE 10 MG PO TABS: ORAL_TABLET | ORAL | 3 refills | Status: DC

## 2023-01-06 ENCOUNTER — Other Ambulatory Visit: Payer: Self-pay | Admitting: Internal Medicine

## 2023-01-06 DIAGNOSIS — F411 Generalized anxiety disorder: Secondary | ICD-10-CM

## 2023-01-07 ENCOUNTER — Other Ambulatory Visit: Payer: Self-pay | Admitting: Internal Medicine

## 2023-01-07 DIAGNOSIS — E349 Endocrine disorder, unspecified: Secondary | ICD-10-CM

## 2023-01-07 MED ORDER — TESTOSTERONE CYPIONATE 200 MG/ML IM SOLN: INTRAMUSCULAR | 2 refills | Status: DC

## 2023-01-09 ENCOUNTER — Other Ambulatory Visit: Payer: Self-pay | Admitting: Internal Medicine

## 2023-01-09 DIAGNOSIS — F411 Generalized anxiety disorder: Secondary | ICD-10-CM

## 2023-01-12 ENCOUNTER — Other Ambulatory Visit: Payer: Self-pay | Admitting: Internal Medicine

## 2023-01-12 DIAGNOSIS — F411 Generalized anxiety disorder: Secondary | ICD-10-CM

## 2023-01-12 MED ORDER — CLONAZEPAM 1 MG PO TABS: ORAL_TABLET | ORAL | 0 refills | Status: DC

## 2023-03-18 ENCOUNTER — Other Ambulatory Visit: Payer: Self-pay | Admitting: Internal Medicine

## 2023-03-18 DIAGNOSIS — E349 Endocrine disorder, unspecified: Secondary | ICD-10-CM

## 2023-03-18 DIAGNOSIS — N138 Other obstructive and reflux uropathy: Secondary | ICD-10-CM

## 2023-03-18 DIAGNOSIS — E782 Mixed hyperlipidemia: Secondary | ICD-10-CM

## 2023-03-18 DIAGNOSIS — R0989 Other specified symptoms and signs involving the circulatory and respiratory systems: Secondary | ICD-10-CM

## 2023-03-18 DIAGNOSIS — J301 Allergic rhinitis due to pollen: Secondary | ICD-10-CM

## 2023-03-18 MED ORDER — LORATADINE-D 24HR 10-240 MG PO TB24: ORAL_TABLET | ORAL | 3 refills | Status: DC

## 2023-03-18 MED ORDER — FINASTERIDE 5 MG PO TABS: ORAL_TABLET | ORAL | 3 refills | Status: DC

## 2023-03-18 MED ORDER — AMLODIPINE BESYLATE 10 MG PO TABS: ORAL_TABLET | ORAL | 3 refills | Status: DC

## 2023-03-18 MED ORDER — OLMESARTAN MEDOXOMIL 20 MG PO TABS: ORAL_TABLET | ORAL | 3 refills | Status: DC

## 2023-03-18 MED ORDER — ATORVASTATIN CALCIUM 40 MG PO TABS: ORAL_TABLET | ORAL | 3 refills | Status: DC

## 2023-03-18 MED ORDER — TADALAFIL 5 MG PO TABS: ORAL_TABLET | ORAL | 3 refills | Status: DC

## 2023-03-18 MED ORDER — EZETIMIBE 10 MG PO TABS: ORAL_TABLET | ORAL | 3 refills | Status: AC

## 2023-03-18 MED ORDER — TESTOSTERONE CYPIONATE 200 MG/ML IM SOLN
INTRAMUSCULAR | 2 refills | Status: DC
Start: 2023-03-18 — End: 2023-06-30

## 2023-03-18 MED ORDER — TAMSULOSIN HCL 0.4 MG PO CAPS
ORAL_CAPSULE | ORAL | 3 refills | Status: DC
Start: 2023-03-18 — End: 2023-08-10

## 2023-03-23 ENCOUNTER — Ambulatory Visit: Payer: BC Managed Care – PPO | Admitting: Nurse Practitioner

## 2023-04-08 ENCOUNTER — Ambulatory Visit: Payer: BC Managed Care – PPO | Admitting: Nurse Practitioner

## 2023-04-23 ENCOUNTER — Other Ambulatory Visit: Payer: Self-pay

## 2023-04-23 DIAGNOSIS — F411 Generalized anxiety disorder: Secondary | ICD-10-CM

## 2023-04-23 MED ORDER — CLONAZEPAM 1 MG PO TABS
ORAL_TABLET | ORAL | 0 refills | Status: DC
Start: 2023-04-23 — End: 2023-06-30

## 2023-06-15 ENCOUNTER — Ambulatory Visit: Payer: Self-pay | Admitting: Family Medicine

## 2023-06-29 ENCOUNTER — Telehealth: Payer: Self-pay

## 2023-06-29 ENCOUNTER — Other Ambulatory Visit: Payer: Self-pay | Admitting: Family Medicine

## 2023-06-29 DIAGNOSIS — N138 Other obstructive and reflux uropathy: Secondary | ICD-10-CM

## 2023-06-29 MED ORDER — TADALAFIL 5 MG PO TABS: 5.0000 mg | ORAL_TABLET | Freq: Every day | ORAL | 0 refills | Status: DC
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
## 2023-06-29 NOTE — Telephone Encounter (Signed)
 Copied from CRM [id number]. Topic: Clinical - Prescription Issue >> Jun 29, 2023 10:09 AM [name] wrote: Reason for CRM: THE PATIENT IS NEEDING PRESCRIPTIONS REFILLED BUT HAD TO CANCEL HIS APPT WITH DR Hildy Lowers DUE TO HIS DAUGHTER BEING IN THE HOSPITAL. HE WAS A PREVIOUS PATIENT OF DR Cassondra Cliff.

## 2023-06-30 ENCOUNTER — Encounter: Payer: Self-pay | Admitting: Family Medicine

## 2023-06-30 ENCOUNTER — Other Ambulatory Visit: Payer: Self-pay | Admitting: Family Medicine

## 2023-06-30 ENCOUNTER — Ambulatory Visit (INDEPENDENT_AMBULATORY_CARE_PROVIDER_SITE_OTHER): Admitting: Family Medicine

## 2023-06-30 VITALS — BP 134/80 | HR 74 | Temp 97.9°F | Wt 195.8 lb

## 2023-06-30 DIAGNOSIS — N401 Enlarged prostate with lower urinary tract symptoms: Secondary | ICD-10-CM | POA: Diagnosis not present

## 2023-06-30 DIAGNOSIS — E782 Mixed hyperlipidemia: Secondary | ICD-10-CM

## 2023-06-30 DIAGNOSIS — F419 Anxiety disorder, unspecified: Secondary | ICD-10-CM

## 2023-06-30 DIAGNOSIS — E349 Endocrine disorder, unspecified: Secondary | ICD-10-CM

## 2023-06-30 DIAGNOSIS — R7303 Prediabetes: Secondary | ICD-10-CM

## 2023-06-30 DIAGNOSIS — E66811 Obesity, class 1: Secondary | ICD-10-CM | POA: Insufficient documentation

## 2023-06-30 DIAGNOSIS — I700 Atherosclerosis of aorta: Secondary | ICD-10-CM

## 2023-06-30 DIAGNOSIS — R3914 Feeling of incomplete bladder emptying: Secondary | ICD-10-CM

## 2023-06-30 DIAGNOSIS — G4733 Obstructive sleep apnea (adult) (pediatric): Secondary | ICD-10-CM

## 2023-06-30 DIAGNOSIS — R0989 Other specified symptoms and signs involving the circulatory and respiratory systems: Secondary | ICD-10-CM

## 2023-06-30 DIAGNOSIS — J310 Chronic rhinitis: Secondary | ICD-10-CM

## 2023-06-30 DIAGNOSIS — E559 Vitamin D deficiency, unspecified: Secondary | ICD-10-CM

## 2023-06-30 DIAGNOSIS — N182 Chronic kidney disease, stage 2 (mild): Secondary | ICD-10-CM

## 2023-06-30 LAB — CBC WITH DIFFERENTIAL/PLATELET
Basophils Absolute: 0.1 10*3/uL (ref 0.0–0.1)
Basophils Relative: 1.3 % (ref 0.0–3.0)
Eosinophils Absolute: 0.1 10*3/uL (ref 0.0–0.7)
Eosinophils Relative: 3.3 % (ref 0.0–5.0)
HCT: 46.1 % (ref 39.0–52.0)
Hemoglobin: 15.2 g/dL (ref 13.0–17.0)
Lymphocytes Relative: 24.3 % (ref 12.0–46.0)
Lymphs Abs: 1.1 10*3/uL (ref 0.7–4.0)
MCHC: 33 g/dL (ref 30.0–36.0)
MCV: 96.2 fl (ref 78.0–100.0)
Monocytes Absolute: 0.6 10*3/uL (ref 0.1–1.0)
Monocytes Relative: 14.2 % — ABNORMAL HIGH (ref 3.0–12.0)
Neutro Abs: 2.5 10*3/uL (ref 1.4–7.7)
Neutrophils Relative %: 56.9 % (ref 43.0–77.0)
Platelets: 168 10*3/uL (ref 150.0–400.0)
RBC: 4.8 Mil/uL (ref 4.22–5.81)
RDW: 13.5 % (ref 11.5–15.5)
WBC: 4.4 10*3/uL (ref 4.0–10.5)

## 2023-06-30 LAB — TSH: TSH: 1.04 u[IU]/mL (ref 0.35–5.50)

## 2023-06-30 LAB — COMPREHENSIVE METABOLIC PANEL WITH GFR
ALT: 21 U/L (ref 0–53)
AST: 28 U/L (ref 0–37)
Albumin: 4.4 g/dL (ref 3.5–5.2)
Alkaline Phosphatase: 48 U/L (ref 39–117)
BUN: 19 mg/dL (ref 6–23)
CO2: 31 meq/L (ref 19–32)
Calcium: 9.3 mg/dL (ref 8.4–10.5)
Chloride: 102 meq/L (ref 96–112)
Creatinine, Ser: 1.22 mg/dL (ref 0.40–1.50)
GFR: 62.33 mL/min (ref >60.00)
Glucose, Bld: 94 mg/dL (ref 70–99)
Potassium: 3.9 meq/L (ref 3.5–5.1)
Sodium: 139 meq/L (ref 135–145)
Total Bilirubin: 0.8 mg/dL (ref 0.2–1.2)
Total Protein: 6.8 g/dL (ref 6.0–8.3)

## 2023-06-30 LAB — URINALYSIS, ROUTINE W REFLEX MICROSCOPIC
Bilirubin Urine: NEGATIVE
Hgb urine dipstick: NEGATIVE
Ketones, ur: NEGATIVE
Leukocytes,Ua: NEGATIVE
Nitrite: NEGATIVE
Specific Gravity, Urine: 1.02 (ref 1.000–1.030)
Total Protein, Urine: 30 — AB
Urine Glucose: NEGATIVE
Urobilinogen, UA: 1 (ref 0.0–1.0)
pH: 6.5 (ref 5.0–8.0)

## 2023-06-30 LAB — LIPID PANEL
Cholesterol: 174 mg/dL (ref 0–200)
HDL: 62.6 mg/dL (ref >39.00)
LDL Cholesterol: 101 mg/dL — ABNORMAL HIGH (ref 0–99)
NonHDL: 111.7
Total CHOL/HDL Ratio: 3
Triglycerides: 55 mg/dL (ref 0.0–149.0)
VLDL: 11 mg/dL (ref 0.0–40.0)

## 2023-06-30 LAB — PSA: PSA: 2.1 ng/mL (ref 0.10–4.00)

## 2023-06-30 LAB — MICROALBUMIN / CREATININE URINE RATIO
Creatinine,U: 189.5 mg/dL
Microalb Creat Ratio: 143.6 mg/g — ABNORMAL HIGH (ref 0.0–30.0)
Microalb, Ur: 27.2 mg/dL — ABNORMAL HIGH (ref 0.0–1.9)

## 2023-06-30 LAB — TESTOSTERONE: Testosterone: 1110.05 ng/dL — ABNORMAL HIGH (ref 300.00–890.00)

## 2023-06-30 LAB — HEMOGLOBIN A1C: Hgb A1c MFr Bld: 6 % (ref 4.6–6.5)

## 2023-06-30 MED ORDER — CLONAZEPAM 1 MG PO TABS: ORAL_TABLET | ORAL | 0 refills | Status: DC

## 2023-06-30 MED ORDER — TESTOSTERONE CYPIONATE 200 MG/ML IM SOLN: INTRAMUSCULAR | 2 refills | Status: DC

## 2023-06-30 MED ORDER — ROSUVASTATIN CALCIUM 40 MG PO TABS: 40.0000 mg | ORAL_TABLET | Freq: Every day | ORAL | 3 refills | Status: DC

## 2023-06-30 NOTE — Telephone Encounter (Signed)
 Pt scheduled for NP appt 06/30/23 at 1000.
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
## 2023-06-30 NOTE — Patient Instructions (Signed)
  VISIT SUMMARY: Today, you came in for medication management and follow-up. We discussed your use of clonazepam  for anxiety and sleep issues, as well as testosterone  therapy for lethargy and chronic rhinitis. We also reviewed your history of diverticulitis, orthopedic issues, and other ongoing health conditions.  YOUR PLAN: -HYPERTENSION: Your blood pressure was slightly elevated today, which may be due to the stress of a new provider visit. We will recheck your blood pressure to confirm current levels. Continue taking your current medications, amlodipine  and olmesartan , as prescribed.  -HYPERLIPIDEMIA: Hyperlipidemia means you have high cholesterol levels, which increases your risk of heart disease. We will order a lipid panel to check your current cholesterol levels and continue your current medications, atorvastatin  and Zetia .  -BENIGN PROSTATIC HYPERPLASIA: Benign prostatic hyperplasia is an enlarged prostate that can cause urinary symptoms. Continue taking your current medications, tamsulosin , finasteride , and Cialis . We will monitor your prostate health, especially since you are on testosterone  therapy.  -SLEEP APNEA: Sleep apnea is a condition where you stop breathing briefly during sleep. Your condition is well-controlled with CPAP therapy, but we will continue to monitor it, especially since testosterone  therapy can worsen sleep apnea.  - LOW TESTOSTERONE : We are refilling your testosterone  as well as checking associated labs including testosterone  levels.  -CHRONIC RHINITIS: Chronic rhinitis is a condition that causes a runny nose and other symptoms. Your symptoms have improved with testosterone  therapy, so continue with your current testosterone  regimen.  -DIVERTICULITIS: Diverticulitis is an inflammation or infection of small pouches in your digestive tract. It is often associated with stress and anxiety. We will refill your clonazepam  prescription with 30 pills to help manage your  current stress and anxiety.  -GENERAL HEALTH MAINTENANCE: We need to perform some routine health checks to ensure your overall well-being. This includes basic blood work, a colonoscopy when you are back in [place], and considering a bone density check in the future.  INSTRUCTIONS: Please schedule a follow-up appointment in three months. Also, monitor your daughter's health and update us  as needed.                       Contains text generated by [employer].

## 2023-07-02 ENCOUNTER — Ambulatory Visit: Payer: Self-pay | Admitting: Family Medicine

## 2023-07-05 LAB — IRON,TIBC AND FERRITIN PANEL
%SAT: 28 % (ref 20–48)
Ferritin: 43 ng/mL (ref 24–380)
Iron: 99 ug/dL (ref 50–180)
TIBC: 358 ug/dL (ref 250–425)

## 2023-07-05 LAB — VITAMIN D 1,25 DIHYDROXY
Vitamin D 1, 25 (OH)2 Total: 38 pg/mL (ref 18–72)
Vitamin D2 1, 25 (OH)2: <8 pg/mL
Vitamin D3 1, 25 (OH)2: 38 pg/mL

## 2023-07-05 LAB — SPECIMEN COMPROMISED

## 2023-07-13 ENCOUNTER — Encounter: Payer: BC Managed Care – PPO | Admitting: Internal Medicine

## 2023-08-10 ENCOUNTER — Other Ambulatory Visit: Payer: Self-pay | Admitting: Family Medicine

## 2023-08-10 DIAGNOSIS — F419 Anxiety disorder, unspecified: Secondary | ICD-10-CM

## 2023-08-10 DIAGNOSIS — I700 Atherosclerosis of aorta: Secondary | ICD-10-CM

## 2023-08-10 DIAGNOSIS — E782 Mixed hyperlipidemia: Secondary | ICD-10-CM

## 2023-08-10 DIAGNOSIS — R0989 Other specified symptoms and signs involving the circulatory and respiratory systems: Secondary | ICD-10-CM

## 2023-08-10 DIAGNOSIS — N138 Other obstructive and reflux uropathy: Secondary | ICD-10-CM

## 2023-08-10 NOTE — Telephone Encounter (Unsigned)
 Copied from CRM [id number]. Topic: Clinical - Medication Refill >> Aug 10, 2023 11:07 AM Trula Gable C wrote: Medication: atorvastatin  (LIPITOR) 40 MG tablet  tadalafil  (CIALIS ) 5 MG tablet tamsulosin  (FLOMAX ) 0.4 MG CAPS capsule olmesartan  (BENICAR ) 20 MG tablet  clonazePAM  (KLONOPIN ) 1 MG tablet  Has the patient contacted their pharmacy? Yes (Agent: If no, request that the patient contact the pharmacy for the refill. If patient does not wish to contact the pharmacy document the reason why and proceed with request.) (Agent: If yes, when and what did the pharmacy advise?)  This is the patient's preferred pharmacy:   [id number] - [place], [place] - [address] AT [facility] OF [address] [facility] [place] [address] Phone: [phone] Fax: [phone]  Is this the correct pharmacy for this prescription? Yes If no, delete pharmacy and type the correct one.   Has the prescription been filled recently? No  Is the patient out of the medication? Yes  Has the patient been seen for an appointment in the last year OR does the patient have an upcoming appointment? Yes  Can we respond through MyChart? Yes  Agent: Please be advised that Rx refills may take up to 3 business days. We ask that you follow-up with your pharmacy.

## 2023-08-10 NOTE — Telephone Encounter (Signed)
 Last 06/30/23 Filled by historical provider

## 2023-08-11 MED ORDER — CLONAZEPAM 1 MG PO TABS: ORAL_TABLET | ORAL | 0 refills | Status: DC

## 2023-08-11 MED ORDER — TAMSULOSIN HCL 0.4 MG PO CAPS: ORAL_CAPSULE | ORAL | 3 refills | Status: AC

## 2023-08-11 MED ORDER — TADALAFIL 5 MG PO TABS: 5.0000 mg | ORAL_TABLET | Freq: Every day | ORAL | 0 refills | Status: DC

## 2023-08-11 MED ORDER — OLMESARTAN MEDOXOMIL 20 MG PO TABS: ORAL_TABLET | ORAL | 3 refills | Status: DC

## 2023-08-11 MED ORDER — ROSUVASTATIN CALCIUM 40 MG PO TABS: 40.0000 mg | ORAL_TABLET | Freq: Every day | ORAL | 3 refills | Status: AC

## 2023-08-12 NOTE — Telephone Encounter (Signed)
 Copied from CRM [id number]. Topic: Clinical - Medication Question >> Aug 11, 2023  1:05 PM Deaijah H wrote: Reason for CRM: Patient would like to have Dr. Hildy Lowers or a nurse to give a call and confirm if Dr. Hildy Lowers will refill the previous prescriptions that Dr. Cassondra Cliff were filling. Please call [phone] to confirm

## 2023-09-14 ENCOUNTER — Other Ambulatory Visit: Payer: Self-pay | Admitting: Family Medicine

## 2023-09-14 DIAGNOSIS — N138 Other obstructive and reflux uropathy: Secondary | ICD-10-CM

## 2023-09-14 DIAGNOSIS — F419 Anxiety disorder, unspecified: Secondary | ICD-10-CM

## 2023-09-17 ENCOUNTER — Other Ambulatory Visit: Payer: Self-pay | Admitting: Family Medicine

## 2023-09-17 DIAGNOSIS — N138 Other obstructive and reflux uropathy: Secondary | ICD-10-CM

## 2023-09-17 NOTE — Telephone Encounter (Signed)
 Requesting:CLONAZEPAM  1MG  TABLETS , TADALAFIL  5MG  TABLETS  Last Visit: 06/30/2023 Next Visit: 12/30/2023 Last Refill: 08/11/2023  Please Advise

## 2023-09-18 ENCOUNTER — Telehealth: Payer: Self-pay | Admitting: Family Medicine

## 2023-09-18 DIAGNOSIS — N138 Other obstructive and reflux uropathy: Secondary | ICD-10-CM

## 2023-09-18 MED ORDER — TADALAFIL 5 MG PO TABS: 5.0000 mg | ORAL_TABLET | Freq: Every day | ORAL | 3 refills | Status: DC | PRN

## 2023-09-18 NOTE — Addendum Note (Signed)
 Addended by: GLADIS CLAUDENE GRATE Y on: 09/18/2023 04:37 PM   Modules accepted: Orders

## 2023-09-18 NOTE — Telephone Encounter (Addendum)
 I called and spoke with patient and he said that Rx of Tadalafil  was not received by pharmacy. I told patient that we would resend.    I did call [facility] pharmacy to verify and they did not receive prescription. Rx resent.

## 2023-09-18 NOTE — Telephone Encounter (Signed)
 Pharmacy on file called and verified-the order sent yesterday wasn't received. [facility] pharmacy on file is needing the prescription sent again.   Copied from CRM [id number]. Topic: Clinical - Prescription Issue >> Sep 18, 2023  3:18 PM Chiquita SQUIBB wrote: Reason for CRM: Patient is calling in stating that he just spoke to the [facility] pharmacy and they are stating they have not received the tadalafil  (CIALIS ) 5 MG tablet [[id number]] medication script. Patient is asking if the pharmacy can be contacted.

## 2023-12-30 ENCOUNTER — Ambulatory Visit: Admitting: Family Medicine

## 2023-12-30 ENCOUNTER — Encounter: Payer: Self-pay | Admitting: Family Medicine

## 2023-12-30 VITALS — BP 160/75 | HR 76 | Temp 97.3°F | Resp 18 | Wt 201.8 lb

## 2023-12-30 DIAGNOSIS — Z23 Encounter for immunization: Secondary | ICD-10-CM | POA: Diagnosis not present

## 2023-12-30 DIAGNOSIS — E559 Vitamin D deficiency, unspecified: Secondary | ICD-10-CM | POA: Diagnosis not present

## 2023-12-30 DIAGNOSIS — E291 Testicular hypofunction: Secondary | ICD-10-CM

## 2023-12-30 DIAGNOSIS — E349 Endocrine disorder, unspecified: Secondary | ICD-10-CM | POA: Diagnosis not present

## 2023-12-30 DIAGNOSIS — Z1211 Encounter for screening for malignant neoplasm of colon: Secondary | ICD-10-CM

## 2023-12-30 DIAGNOSIS — E782 Mixed hyperlipidemia: Secondary | ICD-10-CM | POA: Diagnosis not present

## 2023-12-30 DIAGNOSIS — Z1212 Encounter for screening for malignant neoplasm of rectum: Secondary | ICD-10-CM

## 2023-12-30 DIAGNOSIS — R7303 Prediabetes: Secondary | ICD-10-CM | POA: Diagnosis not present

## 2023-12-30 DIAGNOSIS — F419 Anxiety disorder, unspecified: Secondary | ICD-10-CM

## 2023-12-30 LAB — CBC WITH DIFFERENTIAL/PLATELET
Basophils Absolute: 0.1 K/uL (ref 0.0–0.1)
Basophils Relative: 1.2 % (ref 0.0–3.0)
Eosinophils Absolute: 0.1 K/uL (ref 0.0–0.7)
Eosinophils Relative: 2.1 % (ref 0.0–5.0)
HCT: 48.7 % (ref 39.0–52.0)
Hemoglobin: 16.4 g/dL (ref 13.0–17.0)
Lymphocytes Relative: 20.8 % (ref 12.0–46.0)
Lymphs Abs: 1 K/uL (ref 0.7–4.0)
MCHC: 33.6 g/dL (ref 30.0–36.0)
MCV: 94.2 fl (ref 78.0–100.0)
Monocytes Absolute: 0.6 K/uL (ref 0.1–1.0)
Monocytes Relative: 12.6 % — ABNORMAL HIGH (ref 3.0–12.0)
Neutro Abs: 3.2 K/uL (ref 1.4–7.7)
Neutrophils Relative %: 63.3 % (ref 43.0–77.0)
Platelets: 171 K/uL (ref 150.0–400.0)
RBC: 5.17 Mil/uL (ref 4.22–5.81)
RDW: 13.8 % (ref 11.5–15.5)
WBC: 5 K/uL (ref 4.0–10.5)

## 2023-12-30 LAB — TESTOSTERONE: Testosterone: 475.66 ng/dL (ref 300.00–890.00)

## 2023-12-30 LAB — LIPID PANEL
Cholesterol: 127 mg/dL (ref 0–200)
HDL: 59.1 mg/dL (ref >39.00)
LDL Cholesterol: 53 mg/dL (ref 0–99)
NonHDL: 67.79
Total CHOL/HDL Ratio: 2
Triglycerides: 74 mg/dL (ref 0.0–149.0)
VLDL: 14.8 mg/dL (ref 0.0–40.0)

## 2023-12-30 LAB — HEMOGLOBIN A1C: Hgb A1c MFr Bld: 5.8 % (ref 4.6–6.5)

## 2023-12-30 LAB — COMPREHENSIVE METABOLIC PANEL WITH GFR
ALT: 31 U/L (ref 0–53)
AST: 35 U/L (ref 0–37)
Albumin: 4.5 g/dL (ref 3.5–5.2)
Alkaline Phosphatase: 50 U/L (ref 39–117)
BUN: 19 mg/dL (ref 6–23)
CO2: 29 meq/L (ref 19–32)
Calcium: 9.5 mg/dL (ref 8.4–10.5)
Chloride: 104 meq/L (ref 96–112)
Creatinine, Ser: 1.23 mg/dL (ref 0.40–1.50)
GFR: 61.51 mL/min (ref >60.00)
Glucose, Bld: 109 mg/dL — ABNORMAL HIGH (ref 70–99)
Potassium: 4.1 meq/L (ref 3.5–5.1)
Sodium: 138 meq/L (ref 135–145)
Total Bilirubin: 0.8 mg/dL (ref 0.2–1.2)
Total Protein: 7.3 g/dL (ref 6.0–8.3)

## 2023-12-30 MED ORDER — CLONAZEPAM 1 MG PO TABS: ORAL_TABLET | ORAL | 5 refills | Status: AC

## 2023-12-30 NOTE — Patient Instructions (Signed)
  VISIT SUMMARY: Today, we reviewed your chronic conditions, including hypertension, hyperlipidemia, right supraspinatus tear, benign prostatic hyperplasia, erectile dysfunction, insomnia, and psychological stress. We made adjustments to your medications and discussed various management strategies.  YOUR PLAN: HYPERTENSION: Your blood pressure was elevated today. -Add amlodipine  to your current regimen. -Check your blood pressure at home. -Recheck your blood pressure in one month.  HYPERLIPIDEMIA: You are now taking rosuvastatin  (Crestor ) without any side effects. -Continue taking rosuvastatin . -Recheck your cholesterol levels.  TESTOSTERONE  DEFICIENCY: Your testosterone  levels were previously high due to a recent injection. -Recheck your testosterone  levels. -Recheck your blood counts to monitor viscosity.  BENIGN PROSTATIC HYPERPLASIA WITH LOWER URINARY TRACT SYMPTOMS: You are taking tadalafil  5 mg daily, which helps with both BPH and erectile dysfunction. -Continue taking tadalafil  5 mg daily.  INSOMNIA: You are experiencing insomnia, previously managed with clonazepam . -Continue taking clonazepam  at half dose for sleep. -Use the [employer] Coach app for insomnia management.  OBSTRUCTIVE SLEEP APNEA: You are using a CPAP machine, which has been adjusted recently. -Continue using your CPAP machine as directed.  RIGHT SUPRASPINATUS TENDON TEAR: You have a severe tear of the right supraspinatus tendon, which is not reparable. -Be cautious during martial arts activities to avoid further injury.  GENERAL HEALTH MAINTENANCE: We discussed the importance of regular exercise and a healthy diet. -Referral for a colonoscopy due to your history of polyps.
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
## 2024-01-03 LAB — VITAMIN D 1,25 DIHYDROXY
Vitamin D 1, 25 (OH)2 Total: 29 pg/mL (ref 18–72)
Vitamin D2 1, 25 (OH)2: <8 pg/mL
Vitamin D3 1, 25 (OH)2: 29 pg/mL

## 2024-01-03 LAB — IRON,TIBC AND FERRITIN PANEL
%SAT: 26 % (ref 20–48)
Ferritin: 38 ng/mL (ref 24–380)
Iron: 95 ug/dL (ref 50–180)
TIBC: 372 ug/dL (ref 250–425)

## 2024-01-03 LAB — INSULIN, RANDOM: Insulin: 8.8 u[IU]/mL

## 2024-01-05 ENCOUNTER — Ambulatory Visit: Payer: Self-pay | Admitting: Family Medicine

## 2024-02-01 ENCOUNTER — Other Ambulatory Visit: Payer: Self-pay | Admitting: Family Medicine

## 2024-02-01 DIAGNOSIS — E349 Endocrine disorder, unspecified: Secondary | ICD-10-CM

## 2024-02-04 ENCOUNTER — Ambulatory Visit

## 2024-02-04 VITALS — Ht 65.0 in | Wt 204.6 lb

## 2024-02-04 DIAGNOSIS — Z860100 Personal history of colon polyps, unspecified: Secondary | ICD-10-CM

## 2024-02-04 MED ORDER — NA SULFATE-K SULFATE-MG SULF 17.5-3.13-1.6 GM/177ML PO SOLN: 1.0000 | Freq: Once | ORAL | 0 refills | Status: AC

## 2024-02-04 NOTE — Progress Notes (Signed)
 No egg or soy allergy known to patient  No issues known to pt with past sedation with any surgeries or procedures Patient denies ever being told they had issues or difficulty with intubation  No FH of Malignant Hyperthermia Pt is not on diet pills Pt is not on  home 02  Pt is not on blood thinners  Pt denies issues with constipation-Stool softener daily   No A fib or A flutter Have any cardiac testing pending--NO Pt can ambulate-independently Pt denies use of chewing tobacco Discussed diabetic I weight loss medication holds Discussed NSAID holds Checked BMI Pt instructed to use Single[employer]com or GoodRx for a price reduction on prep  Patient's chart reviewed by Norleen Schillings CNRA prior to previsit and patient appropriate for the [facility].  Pre visit completed and red dot placed by patient's name on their procedure day (on provider's schedule).

## 2024-02-09 ENCOUNTER — Other Ambulatory Visit: Payer: Self-pay | Admitting: Family Medicine

## 2024-02-09 ENCOUNTER — Ambulatory Visit (INDEPENDENT_AMBULATORY_CARE_PROVIDER_SITE_OTHER): Admitting: Family Medicine

## 2024-02-09 VITALS — BP 163/71 | HR 56 | Temp 97.8°F | Ht 65.0 in | Wt 203.6 lb

## 2024-02-09 DIAGNOSIS — N138 Other obstructive and reflux uropathy: Secondary | ICD-10-CM

## 2024-02-09 DIAGNOSIS — N401 Enlarged prostate with lower urinary tract symptoms: Secondary | ICD-10-CM

## 2024-02-09 DIAGNOSIS — F5101 Primary insomnia: Secondary | ICD-10-CM

## 2024-02-09 DIAGNOSIS — R0989 Other specified symptoms and signs involving the circulatory and respiratory systems: Secondary | ICD-10-CM

## 2024-02-09 DIAGNOSIS — E349 Endocrine disorder, unspecified: Secondary | ICD-10-CM

## 2024-02-09 MED ORDER — OLMESARTAN MEDOXOMIL 40 MG PO TABS: ORAL_TABLET | ORAL | 3 refills | Status: AC

## 2024-02-09 MED ORDER — AMLODIPINE BESYLATE 10 MG PO TABS: ORAL_TABLET | ORAL | 3 refills | Status: AC

## 2024-02-09 MED ORDER — TADALAFIL 10 MG PO TABS: 10.0000 mg | ORAL_TABLET | Freq: Every day | ORAL | 1 refills | Status: AC | PRN
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
## 2024-02-09 NOTE — Patient Instructions (Signed)
 It was very nice to see you today!  VISIT SUMMARY: Today, we discussed your blood pressure management, erectile dysfunction, sleep difficulties, and overall physical activity. Adjustments were made to your medications to better manage your conditions.  YOUR PLAN: HYPERTENSION: Your blood pressure is elevated, and you are currently taking olmesartan  and amlodipine . -Increase olmesartan  to 40 mg daily. -Continue taking amlodipine  as prescribed.  ERECTILE DYSFUNCTION: Your erectile dysfunction has worsened, and stress and sleep issues may be contributing factors. -Increase tadalafil  to 10 mg daily. -If 10 mg is ineffective, consider increasing to 20 mg.  INSOMNIA: You have chronic sleep difficulties and are currently using clonazepam  to aid sleep. -Consider using the app for sleep improvement. -Continue taking clonazepam  0.5 mg as needed for sleep.  HYPERLIPIDEMIA: Your cholesterol levels are well-controlled with rosuvastatin . -Continue taking rosuvastatin  40 mg daily.  TESTOSTERONE  DEFICIENCY: Your testosterone  levels are low but not critically low. -Continue monitoring testosterone  levels.  No follow-ups on file.   Take care, Arvella Hummer, MD, MS   PLEASE NOTE:  If you had any lab tests, please let us  know if you have not heard back within a few days. You may see your results on mychart before we have a chance to review them but we will give you a call once they are reviewed by us .   If we ordered any referrals today, please let us  know if you have not heard from their office within the next week.   If you had any urgent prescriptions sent in today, please check with the pharmacy within an hour of our visit to make sure the prescription was transmitted appropriately.   Please try these tips to maintain a healthy lifestyle:  Eat at least 3 REAL meals and 1-2 snacks per day.  Aim for no more than 5 hours between eating.  If you eat breakfast, please do so within one hour of  getting up.   Each meal should contain half fruits/vegetables, one quarter protein, and one quarter carbs (no bigger than a computer mouse)  Cut down on sweet beverages. This includes juice, soda, and sweet tea.   Drink at least 1 glass of water with each meal and aim for at least 8 glasses per day  Exercise at least 150 minutes every week.

## 2024-02-18 ENCOUNTER — Ambulatory Visit: Admitting: Gastroenterology

## 2024-02-18 VITALS — BP 126/69 | HR 56 | Temp 98.1°F | Resp 11 | Ht 65.0 in | Wt 204.0 lb

## 2024-02-18 DIAGNOSIS — D175 Benign lipomatous neoplasm of intra-abdominal organs: Secondary | ICD-10-CM | POA: Diagnosis not present

## 2024-02-18 DIAGNOSIS — K5520 Angiodysplasia of colon without hemorrhage: Secondary | ICD-10-CM | POA: Diagnosis not present

## 2024-02-18 DIAGNOSIS — Z860100 Personal history of colon polyps, unspecified: Secondary | ICD-10-CM

## 2024-02-18 DIAGNOSIS — Z860101 Personal history of adenomatous and serrated colon polyps: Secondary | ICD-10-CM

## 2024-02-18 DIAGNOSIS — K5730 Diverticulosis of large intestine without perforation or abscess without bleeding: Secondary | ICD-10-CM | POA: Diagnosis not present

## 2024-02-18 DIAGNOSIS — D120 Benign neoplasm of cecum: Secondary | ICD-10-CM | POA: Diagnosis not present

## 2024-02-18 DIAGNOSIS — K648 Other hemorrhoids: Secondary | ICD-10-CM

## 2024-02-18 DIAGNOSIS — Z1211 Encounter for screening for malignant neoplasm of colon: Secondary | ICD-10-CM

## 2024-02-18 DIAGNOSIS — K641 Second degree hemorrhoids: Secondary | ICD-10-CM

## 2024-02-18 MED ORDER — SODIUM CHLORIDE 0.9 % IV SOLN: 500.0000 mL | Freq: Once | INTRAVENOUS | Status: DC

## 2024-02-18 NOTE — Progress Notes (Signed)
 VS by EJ  Pt's states no medical or surgical changes since previsit or office visit.
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
## 2024-02-18 NOTE — Op Note (Signed)
 [facility] Patient Name: [name] Procedure Date: 02/18/2024 8:09 AM MRN: [id number] Endoscopist: Sandor Flatter , MD, [id number] Age: 63 Referring MD:  Date of Birth: September 13, 1960 Gender: Male Account #: [id number] Procedure:                Colonoscopy Indications:              Surveillance: Personal history of adenomatous                            polyps on last colonoscopy 3 years ago                           Last colonoscopy was 05/2020 and notable for 5                            subcentimeter adenomas, medium sized ascending                            colon lipoma, sigmoid diverticulosis, small                            internal hemorrhoids, normal TI, with                            recommendation repeat in 3 years for continued                            surveillance. Medicines:                Monitored Anesthesia Care Procedure:                Pre-Anesthesia Assessment:                           - Prior to the procedure, a History and Physical                            was performed, and patient medications and                            allergies were reviewed. The patient's tolerance of                            previous anesthesia was also reviewed. The risks                            and benefits of the procedure and the sedation                            options and risks were discussed with the patient.                            All questions were answered, and informed consent  was obtained. Prior Anticoagulants: The patient has                            taken no anticoagulant or antiplatelet agents. ASA                            Grade Assessment: II - A patient with mild systemic                            disease. After reviewing the risks and benefits,                            the patient was deemed in satisfactory condition to                            undergo the procedure.                           After  obtaining informed consent, the colonoscope                            was passed under direct vision. Throughout the                            procedure, the patient's blood pressure, pulse, and                            oxygen saturations were monitored continuously. The                            Olympus Scope SN: [id number] was introduced through                            the anus and advanced to the the cecum, identified                            by appendiceal orifice and ileocecal valve. The                            colonoscopy was performed without difficulty. The                            patient tolerated the procedure well. The quality                            of the bowel preparation was good. The ileocecal                            valve, appendiceal orifice, and rectum were                            photographed. Scope In: 8:16:59 AM Scope Out: 8:31:58 AM Scope Withdrawal Time: 0 hours 12 minutes 18 seconds  Total Procedure Duration: 0 hours  14 minutes 59 seconds  Findings:                 Skin tags were found on perianal exam.                           A 3 mm polyp was found in the cecum. The polyp was                            sessile. The polyp was removed with a cold snare.                            Resection and retrieval were complete. Estimated                            blood loss was minimal.                           Two small angioectasias with typical arborization                            were found in the cecum.                           There was a medium-sized lipoma, in the ascending                            colon.                           Multiple small-mouthed diverticula were found in                            the sigmoid colon and distal descending colon.                           Non-bleeding internal hemorrhoids were found during                            retroflexion. The hemorrhoids were small. Complications:            No immediate  complications. Estimated Blood Loss:     Estimated blood loss was minimal. Impression:               - Perianal skin tags found on perianal exam.                           - One 3 mm polyp in the cecum, removed with a cold                            snare. Resected and retrieved.                           - Two colonic angioectasias.                           -  Medium-sized lipoma in the ascending colon.                           - Diverticulosis in the sigmoid colon and in the                            distal descending colon.                           - Non-bleeding internal hemorrhoids. Recommendation:           - Patient has a contact number available for                            emergencies. The signs and symptoms of potential                            delayed complications were discussed with the                            patient. Return to normal activities tomorrow.                            Written discharge instructions were provided to the                            patient.                           - Resume previous diet.                           - Continue present medications.                           - Await pathology results.                           - Repeat colonoscopy in 5 years for surveillance.                           - Return to GI clinic PRN.                           - Use fiber, for example Citrucel, Fibercon, Konsyl                            or Metamucil. Sandor Flatter, MD 02/18/2024 8:38:38 AM

## 2024-02-18 NOTE — Progress Notes (Signed)
 Called to room to assist during endoscopic procedure.  Patient ID and intended procedure confirmed with present staff. Received instructions for my participation in the procedure from the performing physician.

## 2024-02-18 NOTE — Progress Notes (Signed)
 Report given to PACU, vss

## 2024-02-18 NOTE — Patient Instructions (Addendum)
-    1 polyp removed and sent to pathology, diverticulosis and hemorrhoids - Await pathology results. - Repeat colonoscopy in 5 years for surveillance. - Return to GI clinic PRN. - Use fiber, for example Citrucel, Fibercon, Konsyl     or Metamucil.  YOU HAD AN ENDOSCOPIC PROCEDURE TODAY AT THE [facility]:   Refer to the procedure report that was given to you for any specific questions about what was found during the examination.  If the procedure report does not answer your questions, please call your gastroenterologist to clarify.  If you requested that your care partner not be given the details of your procedure findings, then the procedure report has been included in a sealed envelope for you to review at your convenience later.  YOU SHOULD EXPECT: Some feelings of bloating in the abdomen. Passage of more gas than usual.  Walking can help get rid of the air that was put into your GI tract during the procedure and reduce the bloating. If you had a lower endoscopy (such as a colonoscopy or flexible sigmoidoscopy) you may notice spotting of blood in your stool or on the toilet paper. If you underwent a bowel prep for your procedure, you may not have a normal bowel movement for a few days.  Please Note:  You might notice some irritation and congestion in your nose or some drainage.  This is from the oxygen used during your procedure.  There is no need for concern and it should clear up in a day or so.  SYMPTOMS TO REPORT IMMEDIATELY:  Following lower endoscopy (colonoscopy or flexible sigmoidoscopy):  Excessive amounts of blood in the stool  Significant tenderness or worsening of abdominal pains  Swelling of the abdomen that is new, acute  Fever of 100F or higher   For urgent or emergent issues, a gastroenterologist can be reached at any hour by calling [phone]. Do not use MyChart messaging for urgent concerns.    DIET:  We do recommend a small meal at first, but then you  may proceed to your regular diet.  Drink plenty of fluids but you should avoid alcoholic beverages for 24 hours.  ACTIVITY:  You should plan to take it easy for the rest of today and you should NOT DRIVE or use heavy machinery until tomorrow (because of the sedation medicines used during the test).    FOLLOW UP: Our staff will call the number listed on your records the next business day following your procedure.  We will call around 7:15- 8:00 am to check on you and address any questions or concerns that you may have regarding the information given to you following your procedure. If we do not reach you, we will leave a message.     If any biopsies were taken you will be contacted by phone or by letter within the next 1-3 weeks.  Please call us  at [phone] if you have not heard about the biopsies in 3 weeks.    SIGNATURES/CONFIDENTIALITY: You and/or your care partner have signed paperwork which will be entered into your electronic medical record.  These signatures attest to the fact that that the information above on your After Visit Summary has been reviewed and is understood.  Full responsibility of the confidentiality of this discharge information lies with you and/or your care-partner.

## 2024-02-19 ENCOUNTER — Telehealth: Payer: Self-pay

## 2024-02-19 NOTE — Telephone Encounter (Signed)
 Follow up call to pt, no answer.

## 2024-02-23 LAB — SURGICAL PATHOLOGY

## 2024-03-02 ENCOUNTER — Ambulatory Visit: Payer: Self-pay | Admitting: Gastroenterology

## 2024-04-05 ENCOUNTER — Other Ambulatory Visit: Payer: Self-pay | Admitting: Family Medicine

## 2024-04-05 DIAGNOSIS — E349 Endocrine disorder, unspecified: Secondary | ICD-10-CM

## 2024-04-06 NOTE — Telephone Encounter (Signed)
 Requesting: TESTOSTERONE  CYPIONATE 200 MG/ML IM SOLN Last Visit: 02/09/2024 Next Visit: 04/13/2024 Last Refill: 02/02/2024 Please Advise

## 2024-04-13 ENCOUNTER — Ambulatory Visit: Admitting: Family Medicine
